# Patient Record
Sex: Male | Born: 1935 | Race: Black or African American | Hispanic: No | Smoking: Former smoker
Health system: Southern US, Community
[De-identification: ages and names within clinical notes are randomized; demographics above are authoritative.]

## PROBLEM LIST (undated history)

## (undated) DIAGNOSIS — N189 Chronic kidney disease, unspecified: Secondary | ICD-10-CM

## (undated) DIAGNOSIS — N184 Chronic kidney disease, stage 4 (severe): Secondary | ICD-10-CM

## (undated) DIAGNOSIS — I255 Ischemic cardiomyopathy: Secondary | ICD-10-CM

## (undated) DIAGNOSIS — I10 Essential (primary) hypertension: Secondary | ICD-10-CM

## (undated) DIAGNOSIS — I509 Heart failure, unspecified: Secondary | ICD-10-CM

## (undated) DIAGNOSIS — I5020 Unspecified systolic (congestive) heart failure: Secondary | ICD-10-CM

## (undated) DIAGNOSIS — I2510 Atherosclerotic heart disease of native coronary artery without angina pectoris: Secondary | ICD-10-CM

## (undated) DIAGNOSIS — I739 Peripheral vascular disease, unspecified: Secondary | ICD-10-CM

## (undated) HISTORY — PX: CORONARY STENT INTERVENTION: CATH118234

## (undated) HISTORY — DX: Unspecified systolic (congestive) heart failure: I50.20

## (undated) HISTORY — DX: Heart failure, unspecified: I50.9

## (undated) HISTORY — DX: Chronic kidney disease, stage 4 (severe): N18.4

## (undated) HISTORY — DX: Ischemic cardiomyopathy: I25.5

## (undated) HISTORY — PX: BYPASS GRAFT: SHX909

## (undated) HISTORY — DX: Essential (primary) hypertension: I10

## (undated) HISTORY — PX: REPLACEMENT TOTAL KNEE BILATERAL: SUR1225

---
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
## 2018-10-27 ENCOUNTER — Inpatient Hospital Stay
Admission: EM | Admit: 2018-10-27 | Discharge: 2018-11-02 | DRG: 291 | Disposition: A | Discharge status: Home / Self Care | Payer: Medicare Other | Attending: Internal Medicine | Admitting: Internal Medicine

## 2018-10-27 ENCOUNTER — Inpatient Hospital Stay: Payer: Medicare Other

## 2018-10-27 ENCOUNTER — Encounter: Payer: Self-pay | Admitting: Emergency Medicine

## 2018-10-27 ENCOUNTER — Other Ambulatory Visit: Payer: Self-pay

## 2018-10-27 ENCOUNTER — Inpatient Hospital Stay: Admit: 2018-10-27 | Payer: Medicare Other

## 2018-10-27 ENCOUNTER — Emergency Department: Payer: Medicare Other

## 2018-10-27 DIAGNOSIS — I132 Hypertensive heart and chronic kidney disease with heart failure and with stage 5 chronic kidney disease, or end stage renal disease: Secondary | ICD-10-CM | POA: Diagnosis not present

## 2018-10-27 DIAGNOSIS — I5042 Chronic combined systolic (congestive) and diastolic (congestive) heart failure: Secondary | ICD-10-CM

## 2018-10-27 DIAGNOSIS — Z20828 Contact with and (suspected) exposure to other viral communicable diseases: Secondary | ICD-10-CM | POA: Diagnosis present

## 2018-10-27 DIAGNOSIS — K219 Gastro-esophageal reflux disease without esophagitis: Secondary | ICD-10-CM | POA: Diagnosis present

## 2018-10-27 DIAGNOSIS — N179 Acute kidney failure, unspecified: Secondary | ICD-10-CM

## 2018-10-27 DIAGNOSIS — N289 Disorder of kidney and ureter, unspecified: Secondary | ICD-10-CM | POA: Diagnosis not present

## 2018-10-27 DIAGNOSIS — Z515 Encounter for palliative care: Secondary | ICD-10-CM | POA: Diagnosis not present

## 2018-10-27 DIAGNOSIS — I5043 Acute on chronic combined systolic (congestive) and diastolic (congestive) heart failure: Secondary | ICD-10-CM | POA: Diagnosis present

## 2018-10-27 DIAGNOSIS — I509 Heart failure, unspecified: Secondary | ICD-10-CM | POA: Diagnosis not present

## 2018-10-27 DIAGNOSIS — N184 Chronic kidney disease, stage 4 (severe): Secondary | ICD-10-CM

## 2018-10-27 DIAGNOSIS — I739 Peripheral vascular disease, unspecified: Secondary | ICD-10-CM | POA: Diagnosis present

## 2018-10-27 DIAGNOSIS — I5021 Acute systolic (congestive) heart failure: Secondary | ICD-10-CM | POA: Diagnosis not present

## 2018-10-27 DIAGNOSIS — N185 Chronic kidney disease, stage 5: Secondary | ICD-10-CM | POA: Diagnosis present

## 2018-10-27 DIAGNOSIS — I340 Nonrheumatic mitral (valve) insufficiency: Secondary | ICD-10-CM | POA: Diagnosis not present

## 2018-10-27 DIAGNOSIS — I429 Cardiomyopathy, unspecified: Secondary | ICD-10-CM | POA: Diagnosis present

## 2018-10-27 DIAGNOSIS — D696 Thrombocytopenia, unspecified: Secondary | ICD-10-CM | POA: Diagnosis present

## 2018-10-27 DIAGNOSIS — E039 Hypothyroidism, unspecified: Secondary | ICD-10-CM | POA: Diagnosis present

## 2018-10-27 DIAGNOSIS — J9621 Acute and chronic respiratory failure with hypoxia: Secondary | ICD-10-CM | POA: Diagnosis present

## 2018-10-27 DIAGNOSIS — I248 Other forms of acute ischemic heart disease: Secondary | ICD-10-CM | POA: Diagnosis not present

## 2018-10-27 DIAGNOSIS — I2510 Atherosclerotic heart disease of native coronary artery without angina pectoris: Secondary | ICD-10-CM

## 2018-10-27 DIAGNOSIS — Z79899 Other long term (current) drug therapy: Secondary | ICD-10-CM

## 2018-10-27 DIAGNOSIS — I361 Nonrheumatic tricuspid (valve) insufficiency: Secondary | ICD-10-CM | POA: Diagnosis not present

## 2018-10-27 DIAGNOSIS — Z955 Presence of coronary angioplasty implant and graft: Secondary | ICD-10-CM | POA: Diagnosis not present

## 2018-10-27 DIAGNOSIS — R600 Localized edema: Secondary | ICD-10-CM

## 2018-10-27 DIAGNOSIS — N189 Chronic kidney disease, unspecified: Secondary | ICD-10-CM | POA: Diagnosis not present

## 2018-10-27 DIAGNOSIS — J9611 Chronic respiratory failure with hypoxia: Secondary | ICD-10-CM

## 2018-10-27 DIAGNOSIS — Z7189 Other specified counseling: Secondary | ICD-10-CM | POA: Diagnosis not present

## 2018-10-27 DIAGNOSIS — R079 Chest pain, unspecified: Secondary | ICD-10-CM

## 2018-10-27 DIAGNOSIS — Z66 Do not resuscitate: Secondary | ICD-10-CM | POA: Diagnosis not present

## 2018-10-27 HISTORY — DX: Atherosclerotic heart disease of native coronary artery without angina pectoris: I25.10

## 2018-10-27 HISTORY — DX: Peripheral vascular disease, unspecified: I73.9

## 2018-10-27 LAB — LIPID PANEL
Cholesterol: 240 mg/dL — ABNORMAL HIGH (ref 0–200)
HDL: 45 mg/dL (ref >40)
LDL Cholesterol: 172 mg/dL — ABNORMAL HIGH (ref 0–99)
Total CHOL/HDL Ratio: 5.3 RATIO
Triglycerides: 117 mg/dL (ref <150)
VLDL: 23 mg/dL (ref 0–40)

## 2018-10-27 LAB — COMPREHENSIVE METABOLIC PANEL
ALT: 23 U/L (ref 0–44)
AST: 34 U/L (ref 15–41)
Albumin: 3.7 g/dL (ref 3.5–5.0)
Alkaline Phosphatase: 92 U/L (ref 38–126)
Anion gap: 12 (ref 5–15)
BUN: 58 mg/dL — ABNORMAL HIGH (ref 8–23)
CO2: 16 mmol/L — ABNORMAL LOW (ref 22–32)
Calcium: 10 mg/dL (ref 8.9–10.3)
Chloride: 115 mmol/L — ABNORMAL HIGH (ref 98–111)
Creatinine, Ser: 3.55 mg/dL — ABNORMAL HIGH (ref 0.61–1.24)
GFR calc Af Amer: 17 mL/min — ABNORMAL LOW (ref >60)
GFR calc non Af Amer: 14 mL/min — ABNORMAL LOW (ref >60)
Glucose, Bld: 107 mg/dL — ABNORMAL HIGH (ref 70–99)
Potassium: 4.2 mmol/L (ref 3.5–5.1)
Sodium: 143 mmol/L (ref 135–145)
Total Bilirubin: 1.7 mg/dL — ABNORMAL HIGH (ref 0.3–1.2)
Total Protein: 6.7 g/dL (ref 6.5–8.1)

## 2018-10-27 LAB — BRAIN NATRIURETIC PEPTIDE
B Natriuretic Peptide: >4500 pg/mL — ABNORMAL HIGH (ref 0.0–100.0)
B Natriuretic Peptide: >4500 pg/mL — ABNORMAL HIGH (ref 0.0–100.0)

## 2018-10-27 LAB — CBC
HCT: 40.5 % (ref 39.0–52.0)
Hemoglobin: 13.4 g/dL (ref 13.0–17.0)
MCH: 29.5 pg (ref 26.0–34.0)
MCHC: 33.1 g/dL (ref 30.0–36.0)
MCV: 89.2 fL (ref 80.0–100.0)
Platelets: 134 10*3/uL — ABNORMAL LOW (ref 150–400)
RBC: 4.54 MIL/uL (ref 4.22–5.81)
RDW: 20.5 % — ABNORMAL HIGH (ref 11.5–15.5)
WBC: 4.4 10*3/uL (ref 4.0–10.5)
nRBC: 0 % (ref 0.0–0.2)

## 2018-10-27 LAB — CBC WITH DIFFERENTIAL/PLATELET
Abs Immature Granulocytes: 0.02 10*3/uL (ref 0.00–0.07)
Basophils Absolute: 0 10*3/uL (ref 0.0–0.1)
Basophils Relative: 0 %
Eosinophils Absolute: 0 10*3/uL (ref 0.0–0.5)
Eosinophils Relative: 0 %
HCT: 42.9 % (ref 39.0–52.0)
Hemoglobin: 13.8 g/dL (ref 13.0–17.0)
Immature Granulocytes: 0 %
Lymphocytes Relative: 8 %
Lymphs Abs: 0.5 10*3/uL — ABNORMAL LOW (ref 0.7–4.0)
MCH: 28.8 pg (ref 26.0–34.0)
MCHC: 32.2 g/dL (ref 30.0–36.0)
MCV: 89.4 fL (ref 80.0–100.0)
Monocytes Absolute: 0.6 10*3/uL (ref 0.1–1.0)
Monocytes Relative: 10 %
Neutro Abs: 4.9 10*3/uL (ref 1.7–7.7)
Neutrophils Relative %: 82 %
Platelets: 140 10*3/uL — ABNORMAL LOW (ref 150–400)
RBC: 4.8 MIL/uL (ref 4.22–5.81)
RDW: 20.5 % — ABNORMAL HIGH (ref 11.5–15.5)
WBC: 6 10*3/uL (ref 4.0–10.5)
nRBC: 0 % (ref 0.0–0.2)

## 2018-10-27 LAB — SARS CORONAVIRUS 2 BY RT PCR (HOSPITAL ORDER, PERFORMED IN ~~LOC~~ HOSPITAL LAB): SARS Coronavirus 2: NEGATIVE

## 2018-10-27 LAB — HEPARIN LEVEL (UNFRACTIONATED)
Heparin Unfractionated: 0.66 IU/mL (ref 0.30–0.70)
Heparin Unfractionated: 0.72 IU/mL — ABNORMAL HIGH (ref 0.30–0.70)

## 2018-10-27 LAB — TROPONIN I
Troponin I: 0.3 ng/mL (ref <0.03)
Troponin I: 0.33 ng/mL (ref <0.03)
Troponin I: 0.37 ng/mL (ref <0.03)
Troponin I: 0.33 ng/mL (ref <0.03)

## 2018-10-27 LAB — PROTIME-INR
INR: 1.8 — ABNORMAL HIGH (ref 0.8–1.2)
Prothrombin Time: 20.4 seconds — ABNORMAL HIGH (ref 11.4–15.2)

## 2018-10-27 LAB — CREATININE, SERUM
Creatinine, Ser: 3.45 mg/dL — ABNORMAL HIGH (ref 0.61–1.24)
GFR calc Af Amer: 17 mL/min — ABNORMAL LOW (ref >60)
GFR calc non Af Amer: 15 mL/min — ABNORMAL LOW (ref >60)

## 2018-10-27 LAB — APTT: aPTT: >160 seconds (ref 24–36)

## 2018-10-27 MED ORDER — ASPIRIN EC 81 MG PO TBEC
81.0000 mg | DELAYED_RELEASE_TABLET | Freq: Every day | ORAL | Status: DC
  Administered 2018-10-27 – 2018-11-02 (×7): 81 mg via ORAL
  Filled 2018-10-27 (×7): qty 1

## 2018-10-27 MED ORDER — SODIUM CHLORIDE 0.9% FLUSH
3.0000 mL | Freq: Two times a day (BID) | INTRAVENOUS | Status: DC
  Administered 2018-10-28 – 2018-11-02 (×11): 3 mL via INTRAVENOUS

## 2018-10-27 MED ORDER — ISOSORBIDE MONONITRATE 20 MG PO TABS
10.0000 mg | ORAL_TABLET | Freq: Every day | ORAL | Status: DC
  Administered 2018-10-27 – 2018-10-28 (×2): 10 mg via ORAL
  Filled 2018-10-27 (×2): qty 1

## 2018-10-27 MED ORDER — SODIUM CHLORIDE 0.9 % IV SOLN: 250.0000 mL | INTRAVENOUS | Status: DC | PRN

## 2018-10-27 MED ORDER — ADULT MULTIVITAMIN W/MINERALS CH
1.0000 | ORAL_TABLET | Freq: Every day | ORAL | Status: DC
  Administered 2018-10-27 – 2018-11-02 (×7): 1 via ORAL
  Filled 2018-10-27 (×7): qty 1

## 2018-10-27 MED ORDER — ENOXAPARIN SODIUM 40 MG/0.4ML ~~LOC~~ SOLN: 30.0000 mg | SUBCUTANEOUS | Status: DC

## 2018-10-27 MED ORDER — ACETAMINOPHEN 325 MG PO TABS: 650.0000 mg | ORAL_TABLET | ORAL | Status: DC | PRN

## 2018-10-27 MED ORDER — CENTRUM PO CHEW: 1.0000 | CHEWABLE_TABLET | Freq: Every day | ORAL | Status: DC

## 2018-10-27 MED ORDER — AMLODIPINE BESYLATE 5 MG PO TABS
5.0000 mg | ORAL_TABLET | Freq: Every day | ORAL | Status: DC
  Administered 2018-10-27 – 2018-10-28 (×2): 5 mg via ORAL
  Filled 2018-10-27 (×2): qty 1

## 2018-10-27 MED ORDER — HEPARIN (PORCINE) 25000 UT/250ML-% IV SOLN
850.0000 [IU]/h | INTRAVENOUS | Status: DC
  Administered 2018-10-27: 1000 [IU]/h via INTRAVENOUS
  Administered 2018-10-28 (×2): 950 [IU]/h via INTRAVENOUS
  Filled 2018-10-27 (×2): qty 250

## 2018-10-27 MED ORDER — FOLIC ACID 1 MG PO TABS
1.0000 mg | ORAL_TABLET | Freq: Every day | ORAL | Status: DC
  Administered 2018-10-27 – 2018-11-02 (×7): 1 mg via ORAL
  Filled 2018-10-27 (×7): qty 1

## 2018-10-27 MED ORDER — CARVEDILOL 6.25 MG PO TABS
6.2500 mg | ORAL_TABLET | Freq: Every day | ORAL | Status: DC
  Administered 2018-10-27 – 2018-10-28 (×2): 6.25 mg via ORAL
  Filled 2018-10-27 (×2): qty 1

## 2018-10-27 MED ORDER — HEPARIN BOLUS VIA INFUSION
4000.0000 [IU] | Freq: Once | INTRAVENOUS | Status: AC
  Administered 2018-10-27: 4000 [IU] via INTRAVENOUS
  Filled 2018-10-27: qty 4000

## 2018-10-27 MED ORDER — ATORVASTATIN CALCIUM 20 MG PO TABS
40.0000 mg | ORAL_TABLET | Freq: Every day | ORAL | Status: DC
  Administered 2018-10-27 – 2018-11-01 (×6): 40 mg via ORAL
  Filled 2018-10-27 (×6): qty 2

## 2018-10-27 MED ORDER — PANTOPRAZOLE SODIUM 40 MG PO TBEC
40.0000 mg | DELAYED_RELEASE_TABLET | Freq: Every day | ORAL | Status: DC
  Administered 2018-10-27 – 2018-11-02 (×7): 40 mg via ORAL
  Filled 2018-10-27 (×7): qty 1

## 2018-10-27 MED ORDER — INDAPAMIDE 2.5 MG PO TABS
2.5000 mg | ORAL_TABLET | Freq: Every day | ORAL | Status: DC
  Administered 2018-10-27 – 2018-10-31 (×5): 2.5 mg via ORAL
  Filled 2018-10-27 (×5): qty 1

## 2018-10-27 MED ORDER — ONDANSETRON HCL 4 MG/2ML IJ SOLN: 4.0000 mg | Freq: Four times a day (QID) | INTRAMUSCULAR | Status: DC | PRN

## 2018-10-27 MED ORDER — SODIUM CHLORIDE 0.9% FLUSH
3.0000 mL | INTRAVENOUS | Status: DC | PRN
  Administered 2018-10-29 – 2018-10-30 (×2): 3 mL via INTRAVENOUS
  Filled 2018-10-27 (×2): qty 3

## 2018-10-27 MED ORDER — FUROSEMIDE 10 MG/ML IJ SOLN
40.0000 mg | Freq: Two times a day (BID) | INTRAMUSCULAR | Status: DC
  Administered 2018-10-27 – 2018-10-28 (×2): 40 mg via INTRAVENOUS
  Filled 2018-10-27 (×3): qty 4

## 2018-10-27 MED ORDER — FERROUS SULFATE 325 (65 FE) MG PO TABS
325.0000 mg | ORAL_TABLET | Freq: Every day | ORAL | Status: DC
  Administered 2018-10-27 – 2018-11-02 (×7): 325 mg via ORAL
  Filled 2018-10-27 (×7): qty 1

## 2018-10-27 NOTE — Progress Notes (Signed)
Family Meeting Note  Advance Directive:no  Today a meeting took place with the Patient.  Patient is able to participate.  The following clinical team members were present during this meeting:MD  The following were discussed:Patient's diagnosis: CHF exacerbation, Patient's progosis: Unable to determine and Goals for treatment: Full Code  Additional follow-up to be provided: prn  Time spent during discussion:20 minutes  Evette Doffing, MD

## 2018-10-27 NOTE — Progress Notes (Signed)
Attempted to update family member via home phone r/t mandatory daily update. No answer. Wenda Low [facility]

## 2018-10-27 NOTE — ED Notes (Signed)
MD Corky Downs called daughter and made her aware of admission status. Pharmacy tech calling at this time in order to do medication reconciliation.

## 2018-10-27 NOTE — Consult Note (Signed)
[place] for Heparin Indication: chest pain/ACS  No Known Allergies  Patient Measurements: Height: 6\' 2"  (188 cm) Weight: 170 lb (77.1 kg) IBW/kg (Calculated) : 82.2 Heparin Dosing Weight: 77.1 kg  Vital Signs: BP: 134/67 (05/25 0524) Pulse Rate: 62 (05/25 0524)  Labs: Recent Labs    10/27/18 0352  HGB 13.8  HCT 42.9  PLT 140*  CREATININE 3.55*  TROPONINI 0.30*    Estimated Creatinine Clearance: 15.1 mL/min (A) (by C-G formula based on SCr of 3.55 mg/dL (H)).   Medical History: Past Medical History:  Diagnosis Date  . Renal disorder     Medications:  (Not in a hospital admission)  Scheduled:  . amLODipine  5 mg Oral Daily  . aspirin EC  81 mg Oral Daily  . atorvastatin  40 mg Oral q1800  . carvedilol  6.25 mg Oral Daily  . enoxaparin (LOVENOX) injection  30 mg Subcutaneous Q24H  . ferrous sulfate  220 mg Oral Daily  . furosemide  40 mg Intravenous BID  . indapamide  2.5 mg Oral Daily  . isosorbide mononitrate  10 mg Oral Daily  . multivitamin-iron-minerals-folic acid  1 tablet Oral Daily  . pantoprazole  40 mg Oral Daily  . sodium chloride flush  3 mL Intravenous Q12H   Infusions:  . sodium chloride     PRN: sodium chloride, acetaminophen, ondansetron (ZOFRAN) IV, sodium chloride flush  Assessment: Pharmacy consulted to start heparin for ACS. No note of DOAC PTA.   Goal of Therapy:  Heparin level 0.3-0.7 units/ml Monitor platelets by anticoagulation protocol: Yes   Plan:  Give 4000 units bolus x 1 Start heparin infusion at 1000 units/hr Check anti-Xa level in 8 hours and daily while on heparin Continue to monitor H&H and platelets  Oswald Hillock, PharmD, BCPS 10/27/2018,5:45 AM

## 2018-10-27 NOTE — ED Notes (Signed)
ED TO INPATIENT HANDOFF REPORT  ED Nurse Name and Phone #: Wells Guiles [phone]  S Name/Age/Gender Alex Larson 85 y.o. male Room/Bed: ED02A/ED02A  Code Status   Code Status: Full Code  Home/SNF/Other Home Patient oriented to: self, place, time and situation Is this baseline? Yes   Triage Complete: Triage complete  Chief Complaint Chest Pain  Triage Note Patient reports non radiating mid sternal chest pain off and on for several days.  Patient reports constant short of breath that he wears oxygen for and states it is no worse with pain.   Allergies No Known Allergies  Level of Care/Admitting Diagnosis ED Disposition    ED Disposition Condition [facility] Area: [place] [[id number]]  Level of Care: Med-Surg [16]  Covid Evaluation: N/A  Diagnosis: Acute CHF (congestive heart failure) [facility]) [[id number]]  Admitting Physician: Hyman Bible DODD [[id number]]  Attending Physician: Hyman Bible DODD [[id number]]  Estimated length of stay: 3 - 4 days  Certification:: I certify this patient will need inpatient services for at least 2 midnights  PT Class (Do Not Modify): Inpatient [101]  PT Acc Code (Do Not Modify): Private [1]       B Medical/Surgery History Past Medical History:  Diagnosis Date  . Renal disorder    History reviewed. No pertinent surgical history.   A IV Location/Drains/Wounds Patient Lines/Drains/Airways Status   Active Line/Drains/Airways    Name:   Placement date:   Placement time:   Site:   Days:   Peripheral IV 10/27/18 Left Antecubital   10/27/18    0359    Antecubital   less than 1          Intake/Output Last 24 hours No intake or output data in the 24 hours ending 10/27/18 0536  Labs/Imaging Results for orders placed or performed during the hospital encounter of 10/27/18 (from the past 48 hour(s))  CBC with Differential     Status: Abnormal   Collection Time: 10/27/18  3:52 AM  Result Value Ref Range   WBC 6.0  4.0 - 10.5 K/uL   RBC 4.80 4.22 - 5.81 MIL/uL   Hemoglobin 13.8 13.0 - 17.0 g/dL   HCT 42.9 39.0 - 52.0 %   MCV 89.4 80.0 - 100.0 fL   MCH 28.8 26.0 - 34.0 pg   MCHC 32.2 30.0 - 36.0 g/dL   RDW 20.5 (H) 11.5 - 15.5 %   Platelets 140 (L) 150 - 400 K/uL   nRBC 0.0 0.0 - 0.2 %   Neutrophils Relative % 82 %   Neutro Abs 4.9 1.7 - 7.7 K/uL   Lymphocytes Relative 8 %   Lymphs Abs 0.5 (L) 0.7 - 4.0 K/uL   Monocytes Relative 10 %   Monocytes Absolute 0.6 0.1 - 1.0 K/uL   Eosinophils Relative 0 %   Eosinophils Absolute 0.0 0.0 - 0.5 K/uL   Basophils Relative 0 %   Basophils Absolute 0.0 0.0 - 0.1 K/uL   Immature Granulocytes 0 %   Abs Immature Granulocytes 0.02 0.00 - 0.07 K/uL    Comment: Performed at [facility], [address] [place], [place] [address]  Comprehensive metabolic panel     Status: Abnormal   Collection Time: 10/27/18  3:52 AM  Result Value Ref Range   Sodium 143 135 - 145 mmol/L   Potassium 4.2 3.5 - 5.1 mmol/L   Chloride 115 (H) 98 - 111 mmol/L   CO2 16 (L) 22 - 32 mmol/L  Glucose, Bld 107 (H) 70 - 99 mg/dL   BUN 58 (H) 8 - 23 mg/dL   Creatinine, Ser 3.55 (H) 0.61 - 1.24 mg/dL   Calcium 10.0 8.9 - 10.3 mg/dL   Total Protein 6.7 6.5 - 8.1 g/dL   Albumin 3.7 3.5 - 5.0 g/dL   AST 34 15 - 41 U/L   ALT 23 0 - 44 U/L   Alkaline Phosphatase 92 38 - 126 U/L   Total Bilirubin 1.7 (H) 0.3 - 1.2 mg/dL   GFR calc non Af Amer 14 (L) >60 mL/min   GFR calc Af Amer 17 (L) >60 mL/min   Anion gap 12 5 - 15    Comment: Performed at [facility], [address] [place], [place] [address]  Troponin I - ONCE - STAT     Status: Abnormal   Collection Time: 10/27/18  3:52 AM  Result Value Ref Range   Troponin I 0.30 (HH) <0.03 ng/mL    Comment: CRITICAL RESULT CALLED TO, READ BACK BY AND VERIFIED WITH LEE FERGUSON AT [place] 10/27/2018 DAS Performed at [facility], [address] [place], [place] [address]   Brain natriuretic peptide     Status:  Abnormal   Collection Time: 10/27/18  3:52 AM  Result Value Ref Range   B Natriuretic Peptide >4,500.0 (H) 0.0 - 100.0 pg/mL    Comment: RESULT CONFIRMED BY MANUAL DILUTION DAS Performed at [facility], [address] [place], [place] [address]   SARS Coronavirus 2 (CEPHEID - Performed in [place] hospital lab), Hosp Order     Status: None   Collection Time: 10/27/18  3:52 AM  Result Value Ref Range   SARS Coronavirus 2 NEGATIVE NEGATIVE    Comment: (NOTE) If result is NEGATIVE SARS-CoV-2 target nucleic acids are NOT DETECTED. The SARS-CoV-2 RNA is generally detectable in upper and lower  respiratory specimens during the acute phase of infection. The lowest  concentration of SARS-CoV-2 viral copies this assay can detect is 250  copies / mL. A negative result does not preclude SARS-CoV-2 infection  and should not be used as the sole basis for treatment or other  patient management decisions.  A negative result may occur with  improper specimen collection / handling, submission of specimen other  than nasopharyngeal swab, presence of viral mutation(s) within the  areas targeted by this assay, and inadequate number of viral copies  (<250 copies / mL). A negative result must be combined with clinical  observations, patient history, and epidemiological information. If result is POSITIVE SARS-CoV-2 target nucleic acids are DETECTED. The SARS-CoV-2 RNA is generally detectable in upper and lower  respiratory specimens dur ing the acute phase of infection.  Positive  results are indicative of active infection with SARS-CoV-2.  Clinical  correlation with patient history and other diagnostic information is  necessary to determine patient infection status.  Positive results do  not rule out bacterial infection or co-infection with other viruses. If result is PRESUMPTIVE POSTIVE SARS-CoV-2 nucleic acids MAY BE PRESENT.   A presumptive positive result was obtained on the  submitted specimen  and confirmed on repeat testing.  While 2019 novel coronavirus  (SARS-CoV-2) nucleic acids may be present in the submitted sample  additional confirmatory testing may be necessary for epidemiological  and / or clinical management purposes  to differentiate between  SARS-CoV-2 and other Sarbecovirus currently known to infect humans.  If clinically indicated additional testing with an alternate test  methodology [phone]) is  advised. The SARS-CoV-2 RNA is generally  detectable in upper and lower respiratory sp ecimens during the acute  phase of infection. The expected result is Negative. Fact Sheet for Patients:  StrictlyIdeas.no Fact Sheet for Healthcare Providers: BankingDealers.co.za This test is not yet approved or cleared by the [place] FDA and has been authorized for detection and/or diagnosis of SARS-CoV-2 by FDA under an Emergency Use Authorization (EUA).  This EUA will remain in effect (meaning this test can be used) for the duration of the COVID-19 declaration under Section 564(b)(1) of the Act, 21 U.S.C. section 360bbb-3(b)(1), unless the authorization is terminated or revoked sooner. Performed at [facility], [address] [place], [place] [address]    Dg Chest [place] 1 View  Result Date: 10/27/2018 CLINICAL DATA:  Shortness of breath EXAM: PORTABLE CHEST 1 VIEW COMPARISON:  None. FINDINGS: The cardiac silhouette is enlarged. There are diffuse prominent interstitial lung markings bilaterally. No pneumothorax. No large pleural effusion. There is mild elevation of the right hemidiaphragm. Aortic calcifications are noted. There is no acute osseous abnormality. Degenerative changes are noted of both shoulders. IMPRESSION: Cardiomegaly with prominent interstitial lung markings. Findings can be seen in patients with pulmonary edema versus an atypical infectious process. Electronically Signed   By:  Constance Holster M.D.   On: 10/27/2018 03:55    Pending Labs Unresulted Labs (From admission, onward)    Start     Ordered   11/03/18 0500  Creatinine, serum  (enoxaparin (LOVENOX)    CrCl < 30 ml/min)  Weekly,   STAT    Comments:  while on enoxaparin therapy.    10/27/18 0530   10/28/18 6295  Basic metabolic panel  Daily,   STAT     10/27/18 0530   10/27/18 0536  Lipid panel  Once,   STAT     10/27/18 0535   10/27/18 0536  Hemoglobin A1c  Once,   STAT     10/27/18 0536   10/27/18 0534  Brain natriuretic peptide  Once,   STAT     10/27/18 0533   10/27/18 0533  Troponin I - Now Then Q6H  Now then every 6 hours,   STAT     10/27/18 0532   10/27/18 0531  CBC  (enoxaparin (LOVENOX)    CrCl < 30 ml/min)  Once,   STAT    Comments:  Baseline for enoxaparin therapy IF NOT ALREADY DRAWN.  Notify MD if PLT < 100 K.    10/27/18 0530   10/27/18 0531  Creatinine, serum  (enoxaparin (LOVENOX)    CrCl < 30 ml/min)  Once,   STAT    Comments:  Baseline for enoxaparin therapy IF NOT ALREADY DRAWN.    10/27/18 0530          Vitals/Pain Today's Vitals   10/27/18 0326 10/27/18 0349 10/27/18 0400 10/27/18 0524  BP:  (!) 151/94 (!) 175/85 134/67  Pulse:  68 61 62  Resp:  (!) 22  (!) 22  SpO2:  97% 96% 100%  Weight: 77.1 kg     Height: 6\' 2"  (1.88 m)       Isolation Precautions No active isolations  Medications Medications  sodium chloride flush (NS) 0.9 % injection 3 mL (has no administration in time range)  sodium chloride flush (NS) 0.9 % injection 3 mL (has no administration in time range)  0.9 %  sodium chloride infusion (has no administration in time range)  acetaminophen (TYLENOL) tablet 650 mg (has no administration  in time range)  ondansetron (ZOFRAN) injection 4 mg (has no administration in time range)  enoxaparin (LOVENOX) injection 30 mg (has no administration in time range)  amLODipine (NORVASC) tablet 5 mg (has no administration in time range)  carvedilol (COREG)  tablet 6.25 mg (has no administration in time range)  ferrous sulfate 220 (44 Fe) MG/5ML solution 220 mg (has no administration in time range)  indapamide (LOZOL) tablet 2.5 mg (has no administration in time range)  isosorbide mononitrate (ISMO) tablet 10 mg (has no administration in time range)  multivitamin-iron-minerals-folic acid (CENTRUM) chewable tablet 1 tablet (has no administration in time range)  pantoprazole (PROTONIX) EC tablet 40 mg (has no administration in time range)  furosemide (LASIX) injection 40 mg (has no administration in time range)  aspirin EC tablet 81 mg (has no administration in time range)  atorvastatin (LIPITOR) tablet 40 mg (has no administration in time range)    Mobility walks with device Moderate fall risk   Focused Assessments Cardiac Assessment Handoff:  Cardiac Rhythm: Normal sinus rhythm Lab Results  Component Value Date   TROPONINI 0.30 (HH) 10/27/2018   No results found for: DDIMER Does the Patient currently have chest pain? No     R Recommendations: See Admitting Provider Note  Report given to:   Additional Notes:

## 2018-10-27 NOTE — ED Notes (Signed)
Transport to floor room 239.AS

## 2018-10-27 NOTE — ED Triage Notes (Signed)
Patient reports non radiating mid sternal chest pain off and on for several days.  Patient reports constant short of breath that he wears oxygen for and states it is no worse with pain.

## 2018-10-27 NOTE — Consult Note (Signed)
ANTICOAGULATION CONSULT NOTE  Pharmacy Consult for Heparin Indication: chest pain/ACS  No Known Allergies  Patient Measurements: Height: 6\' 2"  (188 cm) Weight: 170 lb (77.1 kg) IBW/kg (Calculated) : 82.2 Heparin Dosing Weight: 77.1 kg  Vital Signs: Temp: 98.3 F (36.8 C) (05/25 1921) Temp Source: Oral (05/25 1921) BP: 124/84 (05/25 1921) Pulse Rate: 50 (05/25 1921)  Labs: Recent Labs    10/27/18 0352 10/27/18 0639 10/27/18 1039 10/27/18 1418 10/27/18 1732 10/27/18 2210  HGB 13.8 13.4  --   --   --   --   HCT 42.9 40.5  --   --   --   --   PLT 140* 134*  --   --   --   --   APTT  --  >160*  --   --   --   --   LABPROT  --  20.4*  --   --   --   --   INR  --  1.8*  --   --   --   --   HEPARINUNFRC  --   --   --  0.66  --  0.72*  CREATININE 3.55* 3.45*  --   --   --   --   TROPONINI 0.30* 0.33* 0.37*  --  0.33*  --     Estimated Creatinine Clearance: 15.5 mL/min (A) (by C-G formula based on SCr of 3.45 mg/dL (H)).   Medical History: Past Medical History:  Diagnosis Date  . Renal disorder     Medications:  Medications Prior to Admission  Medication Sig Dispense Refill Last Dose  . amLODipine (NORVASC) 5 MG tablet Take 5 mg by mouth daily.   10/26/2018 at Unknown time  . carvedilol (COREG) 6.25 MG tablet Take 6.25 mg by mouth daily.    10/26/2018 at Unknown time  . ferrous sulfate 220 (44 Fe) MG/5ML solution Take 220 mg by mouth daily.   10/26/2018 at Unknown time  . furosemide (LASIX) 40 MG tablet Take 40 mg by mouth 2 (two) times daily.   10/26/2018 at Unknown time  . indapamide (LOZOL) 2.5 MG tablet Take 2.5 mg by mouth daily. 30 minutes prior to furosemide   10/26/2018 at Unknown time  . isosorbide mononitrate (ISMO) 10 MG tablet Take 10 mg by mouth daily.   10/26/2018 at Unknown time  . multivitamin-iron-minerals-folic acid (CENTRUM) chewable tablet Chew 1 tablet by mouth daily.   10/26/2018 at Unknown time  . pantoprazole (PROTONIX) 40 MG tablet Take 40 mg by  mouth daily.   10/26/2018 at Unknown time   Scheduled:  . amLODipine  5 mg Oral Daily  . aspirin EC  81 mg Oral Daily  . atorvastatin  40 mg Oral q1800  . carvedilol  6.25 mg Oral Daily  . ferrous sulfate  325 mg Oral Daily  . folic acid  1 mg Oral Daily  . furosemide  40 mg Intravenous BID  . indapamide  2.5 mg Oral Daily  . isosorbide mononitrate  10 mg Oral Daily  . multivitamin with minerals  1 tablet Oral Daily  . pantoprazole  40 mg Oral Daily  . sodium chloride flush  3 mL Intravenous Q12H   Infusions:  . sodium chloride    . heparin 1,000 Units/hr (10/27/18 0160)   PRN: sodium chloride, acetaminophen, ondansetron (ZOFRAN) IV, sodium chloride flush  Assessment: Pharmacy consulted to start heparin for ACS. No note of DOAC PTA.   Pt was started given 4000 unit bolus and  started an infusion at 1000 units/hr.  5/25 1418 HL 0.66 - continued current rate.  5/25 2210 HL 0.72 - elevated will decrease rate by 1 unit/kg/hr.   Goal of Therapy:  Heparin level 0.3-0.7 units/ml Monitor platelets by anticoagulation protocol: Yes   Plan:  Heparin level is slightly supratherapeutic. Will decrease rate to 950 units/hr.  Check anti-Xa level in 8 hours and CBC daily while on heparin Continue to monitor H&H and platelets    Oswald Hillock, PharmD, BCPS 10/27/2018,10:46 PM

## 2018-10-27 NOTE — ED Notes (Signed)
MD Kinner aware of pt's elevated troponin. MD Kinner at bedside.

## 2018-10-27 NOTE — Progress Notes (Signed)
Patient off unit for testing. Will resume care upon return. Alex Larson M Alex Larson  

## 2018-10-27 NOTE — ED Provider Notes (Signed)
[facility] Emergency Department Provider Note   ____________________________________________    I have reviewed the triage vital signs and the nursing notes.   HISTORY  Chief Complaint Chest Pain     HPI Alex Larson is a 85 y.o. male who presents with complaints of chest pain.  Patient describes burning chest pain in the center of his chest intermittently over the last several days.  He reports his breathing is at baseline, he does wear 2 L nasal cannula at home.  Denies fevers or chills.  No myalgias.  No cough.  Reports that he has had significant swelling in his legs over the last week as well.  No nausea or vomiting or diaphoresis.  Has not taken anything for this  No past medical history on file.  There are no active problems to display for this patient.     Prior to Admission medications   Medication Sig Start Date End Date Taking? Authorizing Provider  amLODipine (NORVASC) 5 MG tablet Take 5 mg by mouth daily.   Yes [provider]  carvedilol (COREG) 6.25 MG tablet Take 6.25 mg by mouth 2 (two) times daily with a meal.   Yes [provider]  ferrous sulfate 220 (44 Fe) MG/5ML solution Take 220 mg by mouth daily.   Yes [provider]  ferrous sulfate 220 (44 Fe) MG/5ML solution Take 325 mg by mouth 2 (two) times daily with a meal.   Yes [provider]  furosemide (LASIX) 40 MG tablet Take 40 mg by mouth 2 (two) times daily.   Yes [provider]  indapamide (LOZOL) 2.5 MG tablet Take 2.5 mg by mouth daily. 30 minutes prior to furosemide   Yes [provider]  isosorbide mononitrate (ISMO) 10 MG tablet Take 10 mg by mouth daily.   Yes [provider]  multivitamin-iron-minerals-folic acid (CENTRUM) chewable tablet Chew 1 tablet by mouth daily.   Yes [provider]  pantoprazole (PROTONIX) 40 MG tablet Take 40 mg by mouth daily.   Yes [provider]      Allergies Patient has no known allergies.  No family history on file.  Social History No alcohol, no smoking Review of Systems  Constitutional: No fever/chills Eyes: No visual changes.  ENT: No sore throat. Cardiovascular: As above Respiratory: Denies shortness of breath. Gastrointestinal: No abdominal pain.  No nausea, no vomiting.   Genitourinary: Negative for dysuria. Musculoskeletal: Edema as above Skin: Negative for rash. Neurological: Negative for headache   ____________________________________________   PHYSICAL EXAM:  VITAL SIGNS: ED Triage Vitals  Enc Vitals Group     BP 10/27/18 0349 (!) 151/94     Pulse Rate 10/27/18 0349 68     Resp 10/27/18 0349 (!) 22     Temp --      Temp src --      SpO2 10/27/18 0349 97 %     Weight 10/27/18 0326 77.1 kg (170 lb)     Height 10/27/18 0326 1.88 m (6\' 2" )     Head Circumference --      Peak Flow --      Pain Score --      Pain Loc --      Pain Edu? --      Excl. in [place]? --     Constitutional: Alert and oriented.  Eyes: Conjunctivae are normal.   Nose: No congestion/rhinnorhea. Mouth/Throat: Mucous membranes are moist.    Cardiovascular: Normal rate, regular rhythm. Grossly  normal heart sounds.  Good peripheral circulation. Respiratory: Normal respiratory effort.  No retractions. Lungs CTAB. Gastrointestinal: Soft and nontender. No distention.  No CVA tenderness.  Musculoskeletal: 2+ edema bilaterally Neurologic:  Normal speech and language. No gross focal neurologic deficits are appreciated.  Skin:  Skin is warm, dry and intact. No rash noted. Psychiatric: Mood and affect are normal. Speech and behavior are normal.  ____________________________________________   LABS (all labs ordered are listed, but only abnormal results are displayed)  Labs Reviewed  CBC WITH DIFFERENTIAL/PLATELET - Abnormal; Notable for the following components:      Result Value   RDW 20.5 (*)    Platelets 140 (*)    Lymphs Abs  0.5 (*)    All other components within normal limits  COMPREHENSIVE METABOLIC PANEL - Abnormal; Notable for the following components:   Chloride 115 (*)    CO2 16 (*)    Glucose, Bld 107 (*)    BUN 58 (*)    Creatinine, Ser 3.55 (*)    Total Bilirubin 1.7 (*)    GFR calc non Af Amer 14 (*)    GFR calc Af Amer 17 (*)    All other components within normal limits  TROPONIN I - Abnormal; Notable for the following components:   Troponin I 0.30 (*)    All other components within normal limits  SARS CORONAVIRUS 2 (HOSPITAL ORDER, [facility])  BRAIN NATRIURETIC PEPTIDE   ____________________________________________  EKG  ED ECG REPORT I, Lavonia Drafts, the attending physician, personally viewed and interpreted this ECG.  Date: 10/27/2018  Rhythm: normal sinus rhythm QRS Axis: Abnormal Intervals: Abnormal ST/T Wave abnormalities: Nonspecific changes   ____________________________________________  RADIOLOGY  Chest x-ray consistent with pulmonary edema ____________________________________________   PROCEDURES  Procedure(s) performed: No  Procedures   Critical Care performed: yes  CRITICAL CARE Performed by: Lavonia Drafts   Total critical care time 30 minutes  Critical care time was exclusive of separately billable procedures and treating other patients.  Critical care was necessary to treat or prevent imminent or life-threatening deterioration.  Critical care was time spent personally by me on the following activities: development of treatment plan with patient and/or surrogate as well as nursing, discussions with consultants, evaluation of patient's response to treatment, examination of patient, obtaining history from patient or surrogate, ordering and performing treatments and interventions, ordering and review of laboratory studies, ordering and review of radiographic studies, pulse oximetry and re-evaluation of patient's condition.   ____________________________________________   INITIAL IMPRESSION / ASSESSMENT AND PLAN / ED COURSE  Pertinent labs & imaging results that were available during my care of the patient were reviewed by me and considered in my medical decision making (see chart for details).  Patient presents with burning chest discomfort as noted above.  Differential includes angina, GERD, less likely ACS, not consistent with PE or dissection.  Pending labs, chest x-ray, EKG, place patient on cardiac  Patient chest x-ray is concerning for pulmonary edema this is in keeping with his clinical exam.  Lab work has returned with a elevated troponin of 0.3 likely related to strain from pulmonary edema, he also has evidence of kidney disease, unclear whether this is chronic or acute.  I discussed with hospitalist service for admission    ____________________________________________   FINAL CLINICAL IMPRESSION(S) / ED DIAGNOSES  Final diagnoses:  Chest pain, unspecified type  Acute on chronic congestive heart failure, unspecified heart failure type ([place])  Note:  This document was prepared using Dragon voice recognition software and may include unintentional dictation errors.   Lavonia Drafts, MD 10/27/18 [phone]

## 2018-10-27 NOTE — H&P (Signed)
[facility] [place] NAME: Alex Larson    MR#:  [id number]  DATE OF BIRTH:  Nov 23, 1926  DATE OF ADMISSION:  10/27/2018  PRIMARY CARE PHYSICIAN: Patient, No Pcp Per   REQUESTING/REFERRING PHYSICIAN: Lavonia Drafts, MD  CHIEF COMPLAINT:   Chief Complaint  Patient presents with  . Chest Pain    HISTORY OF PRESENT ILLNESS:  Alex Larson  is a 85 y.o. male with a known history of CHF, coronary artery disease with stent placement x2, peripheral vascular disease with a history of right lower extremity bypass, and hypertension.  He tells me he recently relocated to the area living with his daughter currently.  His last residence was in [place] and he has not yet established primary care in the area.  He reports being out of his Lasix for 4 months.  he presented to the emergency room complaining of a 3-day history of intermittent midsternal chest pain with associated shortness of breath.  Chest pain described as burning with a pain score 4 out of 10 and nonradiating.  He had 2 episodes of nausea with vomiting on yesterday however he is uncertain whether or not these were associated with the chest pain.  He denies associated diaphoresis.  He denies history of MI.  He has a history of chronic respiratory failure with oxygen at 2 L/min 24 hours a day.  He denies abdominal pain.  He denies fevers, chills, diarrhea.  He denies cough.  He denies a prior history of DVT.  However, he has a history of peripheral vascular disease with right lower extremity bypass in the past.  Labs include troponin of 0.30.  BUN 58 and creatinine 3.55.  Chest x-ray demonstrates prominent interstitial markings suggestive of pulmonary edema.  COVID-19 testing is negative.      PAST MEDICAL HISTORY:   Past Medical History:  Diagnosis Date  . Renal disorder     PAST SURGICAL HISTORY:  History reviewed. No pertinent surgical history.  SOCIAL HISTORY:   Social History    Tobacco Use  . Smoking status: Unknown If Ever Smoked  Substance Use Topics  . Alcohol use: Not Currently    FAMILY HISTORY:  No family history on file.  DRUG ALLERGIES:  No Known Allergies  REVIEW OF SYSTEMS:   Review of Systems  Constitutional: Negative for chills, diaphoresis, fever and malaise/fatigue.  HENT: Negative for congestion, sore throat and tinnitus.   Eyes: Negative for blurred vision and double vision.  Respiratory: Positive for shortness of breath. Negative for cough, sputum production and wheezing.   Cardiovascular: Positive for chest pain and leg swelling. Negative for palpitations and orthopnea.  Gastrointestinal: Positive for nausea and vomiting. Negative for abdominal pain, blood in stool, constipation and diarrhea.  Genitourinary: Negative for dysuria and hematuria.  Musculoskeletal: Negative for falls and myalgias.  Skin: Negative for itching and rash.  Neurological: Negative for dizziness, focal weakness, loss of consciousness and headaches.  Psychiatric/Behavioral: Negative.       MEDICATIONS AT HOME:   Prior to Admission medications   Medication Sig Start Date End Date Taking? Authorizing Provider  amLODipine (NORVASC) 5 MG tablet Take 5 mg by mouth daily.   Yes [provider]  carvedilol (COREG) 6.25 MG tablet Take 6.25 mg by mouth daily.    Yes [provider]  ferrous sulfate 220 (44 Fe) MG/5ML solution Take 220 mg by mouth daily.   Yes [provider]  furosemide (LASIX) 40 MG tablet  Take 40 mg by mouth 2 (two) times daily.   Yes [provider]  indapamide (LOZOL) 2.5 MG tablet Take 2.5 mg by mouth daily. 30 minutes prior to furosemide   Yes [provider]  isosorbide mononitrate (ISMO) 10 MG tablet Take 10 mg by mouth daily.   Yes [provider]  multivitamin-iron-minerals-folic acid (CENTRUM) chewable tablet Chew 1 tablet by mouth daily.   Yes [provider]  pantoprazole  (PROTONIX) 40 MG tablet Take 40 mg by mouth daily.   Yes [provider]      VITAL SIGNS:  Blood pressure 134/67, pulse 62, resp. rate (!) 22, height 6\' 2"  (1.88 m), weight 77.1 kg, SpO2 100 %.  PHYSICAL EXAMINATION:  Physical Exam Vitals signs and nursing note reviewed.  Constitutional:      General: He is not in acute distress.    Appearance: He is ill-appearing.  HENT:     Head: Normocephalic.  Eyes:     Extraocular Movements: Extraocular movements intact.     Pupils: Pupils are equal, round, and reactive to light.  Neck:     Musculoskeletal: Normal range of motion and neck supple.     Vascular: No JVD.  Cardiovascular:     Rate and Rhythm: Normal rate and regular rhythm.     Pulses:          Radial pulses are 2+ on the right side and 2+ on the left side.       Dorsalis pedis pulses are 1+ on the right side and 2+ on the left side.       Posterior tibial pulses are 2+ on the right side and 2+ on the left side.     Heart sounds: Murmur present. Systolic murmur present with a grade of 5/6. No friction rub.  Pulmonary:     Effort: No respiratory distress.     Breath sounds: Normal breath sounds. No decreased breath sounds, wheezing, rhonchi or rales.  Chest:     Chest wall: No mass or tenderness.  Abdominal:     General: Bowel sounds are normal.     Palpations: Abdomen is soft. There is no mass.     Tenderness: There is no abdominal tenderness. There is no guarding or rebound.  Musculoskeletal:     Right lower leg: He exhibits no tenderness. Edema (2+ pitting) present.     Left lower leg: He exhibits no tenderness. Edema (2+ pitting) present.  Skin:    General: Skin is warm.     Capillary Refill: Capillary refill takes less than 2 seconds.     Findings: No rash.  Neurological:     Mental Status: He is alert and oriented to person, place, and time.  Psychiatric:        Mood and Affect: Mood normal.        Behavior: Behavior normal.       LABORATORY  PANEL:   CBC Recent Labs  Lab 10/27/18 0352  WBC 6.0  HGB 13.8  HCT 42.9  PLT 140*   ------------------------------------------------------------------------------------------------------------------  Chemistries  Recent Labs  Lab 10/27/18 0352  NA 143  K 4.2  CL 115*  CO2 16*  GLUCOSE 107*  BUN 58*  CREATININE 3.55*  CALCIUM 10.0  AST 34  ALT 23  ALKPHOS 92  BILITOT 1.7*   ------------------------------------------------------------------------------------------------------------------  Cardiac Enzymes Recent Labs  Lab 10/27/18 0352  TROPONINI 0.30*   ------------------------------------------------------------------------------------------------------------------  RADIOLOGY:  Dg Chest Port 1 View  Result Date: 10/27/2018  CLINICAL DATA:  Shortness of breath EXAM: PORTABLE CHEST 1 VIEW COMPARISON:  None. FINDINGS: The cardiac silhouette is enlarged. There are diffuse prominent interstitial lung markings bilaterally. No pneumothorax. No large pleural effusion. There is mild elevation of the right hemidiaphragm. Aortic calcifications are noted. There is no acute osseous abnormality. Degenerative changes are noted of both shoulders. IMPRESSION: Cardiomegaly with prominent interstitial lung markings. Findings can be seen in patients with pulmonary edema versus an atypical infectious process. Electronically Signed   By: Constance Holster M.D.   On: 10/27/2018 03:55      IMPRESSION AND PLAN:   1.  Chest pain - With troponin initially 0.30.  Given limited history, we have started him on heparin infusion - Cardiology consulted for further evaluation and recommendations as well -Echocardiogram - Telemetry monitoring - Statin and beta-blocker therapy resumed -Lipid panel pending - Continue to trend troponin levels  2.  Acute on chronic CHF - Diuretic therapy initiated with Lasix 40 mg IV twice daily - Repeat chest x-ray in the a.m. - Echocardiogram the a.m.  -Cardiology consulted - He is on telemetry monitoring - Bilateral lower extremity ultrasound with patient's history of right lower extremity bypass --Daily weights with strict I's and O's  3.  Acute kidney injury - Renal ultrasound - We will monitor renal function closely during diuresis with Lasix - We will repeat BMP in the a.m.  4. hypertension - Norvasc resume -We will treat persistent hypertension expectantly  5.  GERD - PPI therapy initiated   All the records are reviewed and case discussed with ED provider. The plan of care was discussed in details with the patient (and family). I answered all questions. The patient agreed to proceed with the above mentioned plan. Further management will depend upon hospital course.   CODE STATUS: Full code  TOTAL TIME TAKING CARE OF THIS PATIENT: 45 minutes.    [place] on 10/27/2018 at 5:40 AM  Pager - [phone]  After 6pm go to [email or web address] - Proofreader  [facility]  Office  [phone]  CC: Primary care physician; Patient, No Pcp Per   Note: This dictation was prepared with Dragon dictation along with smaller phrase technology. Any transcriptional errors that result from this process are unintentional.

## 2018-10-27 NOTE — Consult Note (Signed)
[facility] for Heparin Indication: chest pain/ACS  No Known Allergies  Patient Measurements: Height: 6\' 2"  (188 cm) Weight: 170 lb (77.1 kg) IBW/kg (Calculated) : 82.2 Heparin Dosing Weight: 77.1 kg  Vital Signs: Temp: 97.6 F (36.4 C) (05/25 0618) Temp Source: Oral (05/25 0618) BP: 143/90 (05/25 0755) Pulse Rate: 56 (05/25 0755)  Labs: Recent Labs    10/27/18 0352 10/27/18 0639 10/27/18 1039 10/27/18 1418  HGB 13.8 13.4  --   --   HCT 42.9 40.5  --   --   PLT 140* 134*  --   --   APTT  --  >160*  --   --   LABPROT  --  20.4*  --   --   INR  --  1.8*  --   --   HEPARINUNFRC  --   --   --  0.66  CREATININE 3.55* 3.45*  --   --   TROPONINI 0.30* 0.33* 0.37*  --     Estimated Creatinine Clearance: 15.5 mL/min (A) (by C-G formula based on SCr of 3.45 mg/dL (H)).   Medical History: Past Medical History:  Diagnosis Date  . Renal disorder     Medications:  Medications Prior to Admission  Medication Sig Dispense Refill Last Dose  . amLODipine (NORVASC) 5 MG tablet Take 5 mg by mouth daily.   10/26/2018 at Unknown time  . carvedilol (COREG) 6.25 MG tablet Take 6.25 mg by mouth daily.    10/26/2018 at Unknown time  . ferrous sulfate 220 (44 Fe) MG/5ML solution Take 220 mg by mouth daily.   10/26/2018 at Unknown time  . furosemide (LASIX) 40 MG tablet Take 40 mg by mouth 2 (two) times daily.   10/26/2018 at Unknown time  . indapamide (LOZOL) 2.5 MG tablet Take 2.5 mg by mouth daily. 30 minutes prior to furosemide   10/26/2018 at Unknown time  . isosorbide mononitrate (ISMO) 10 MG tablet Take 10 mg by mouth daily.   10/26/2018 at Unknown time  . multivitamin-iron-minerals-folic acid (CENTRUM) chewable tablet Chew 1 tablet by mouth daily.   10/26/2018 at Unknown time  . pantoprazole (PROTONIX) 40 MG tablet Take 40 mg by mouth daily.   10/26/2018 at Unknown time   Scheduled:  . amLODipine  5 mg Oral Daily  . aspirin EC  81 mg Oral Daily  .  atorvastatin  40 mg Oral q1800  . carvedilol  6.25 mg Oral Daily  . ferrous sulfate  325 mg Oral Daily  . folic acid  1 mg Oral Daily  . furosemide  40 mg Intravenous BID  . indapamide  2.5 mg Oral Daily  . isosorbide mononitrate  10 mg Oral Daily  . multivitamin with minerals  1 tablet Oral Daily  . pantoprazole  40 mg Oral Daily  . sodium chloride flush  3 mL Intravenous Q12H   Infusions:  . sodium chloride    . heparin 1,000 Units/hr (10/27/18 9892)   PRN: sodium chloride, acetaminophen, ondansetron (ZOFRAN) IV, sodium chloride flush  Assessment: Pharmacy consulted to start heparin for ACS. No note of DOAC PTA.   Goal of Therapy:  Heparin level 0.3-0.7 units/ml Monitor platelets by anticoagulation protocol: Yes   Plan:  Give 4000 units bolus x 1 Start heparin infusion at 1000 units/hr Check anti-Xa level in 8 hours and daily while on heparin Continue to monitor H&H and platelets   5/25 HL@ 1418= 0.66. Will continue current drip rate of 1000 units/hr. Will  check confirmatory level in 8 hours.   Candela Krul A, PharmD, BCPS 10/27/2018,3:22 PM

## 2018-10-27 NOTE — Progress Notes (Signed)
Saw the patient bedside, examined patient. Vital sign and lab reviewed. He is on oxygen 4 L.  He feels better. Continue current treatment.  Follow-up cardiology consult.  Discussed with patient and RN.  Time spent about 25 minutes.

## 2018-10-28 ENCOUNTER — Encounter: Payer: Self-pay | Admitting: Physician Assistant

## 2018-10-28 ENCOUNTER — Inpatient Hospital Stay: Payer: Medicare Other

## 2018-10-28 ENCOUNTER — Inpatient Hospital Stay (HOSPITAL_COMMUNITY)
Admit: 2018-10-28 | Discharge: 2018-10-28 | Disposition: A | Discharge status: Home / Self Care | Payer: Medicare Other | Attending: Nurse Practitioner | Admitting: Nurse Practitioner

## 2018-10-28 DIAGNOSIS — I5043 Acute on chronic combined systolic (congestive) and diastolic (congestive) heart failure: Secondary | ICD-10-CM

## 2018-10-28 DIAGNOSIS — I340 Nonrheumatic mitral (valve) insufficiency: Secondary | ICD-10-CM

## 2018-10-28 DIAGNOSIS — E039 Hypothyroidism, unspecified: Secondary | ICD-10-CM

## 2018-10-28 DIAGNOSIS — N289 Disorder of kidney and ureter, unspecified: Secondary | ICD-10-CM

## 2018-10-28 DIAGNOSIS — I248 Other forms of acute ischemic heart disease: Secondary | ICD-10-CM

## 2018-10-28 DIAGNOSIS — I361 Nonrheumatic tricuspid (valve) insufficiency: Secondary | ICD-10-CM

## 2018-10-28 LAB — URINALYSIS, ROUTINE W REFLEX MICROSCOPIC
Bacteria, UA: NONE SEEN
Bilirubin Urine: NEGATIVE
Glucose, UA: NEGATIVE mg/dL
Ketones, ur: NEGATIVE mg/dL
Leukocytes,Ua: NEGATIVE
Nitrite: NEGATIVE
Protein, ur: NEGATIVE mg/dL
Specific Gravity, Urine: 1.006 (ref 1.005–1.030)
pH: 5 (ref 5.0–8.0)

## 2018-10-28 LAB — CBC
HCT: 37.2 % — ABNORMAL LOW (ref 39.0–52.0)
Hemoglobin: 12.4 g/dL — ABNORMAL LOW (ref 13.0–17.0)
MCH: 28.9 pg (ref 26.0–34.0)
MCHC: 33.3 g/dL (ref 30.0–36.0)
MCV: 86.7 fL (ref 80.0–100.0)
Platelets: 123 10*3/uL — ABNORMAL LOW (ref 150–400)
RBC: 4.29 MIL/uL (ref 4.22–5.81)
RDW: 20.2 % — ABNORMAL HIGH (ref 11.5–15.5)
WBC: 3.5 10*3/uL — ABNORMAL LOW (ref 4.0–10.5)
nRBC: 0 % (ref 0.0–0.2)

## 2018-10-28 LAB — ECHOCARDIOGRAM COMPLETE
Height: 74 in
Weight: 2704 oz

## 2018-10-28 LAB — BASIC METABOLIC PANEL
Anion gap: 9 (ref 5–15)
BUN: 59 mg/dL — ABNORMAL HIGH (ref 8–23)
CO2: 18 mmol/L — ABNORMAL LOW (ref 22–32)
Calcium: 9.4 mg/dL (ref 8.9–10.3)
Chloride: 116 mmol/L — ABNORMAL HIGH (ref 98–111)
Creatinine, Ser: 3.35 mg/dL — ABNORMAL HIGH (ref 0.61–1.24)
GFR calc Af Amer: 18 mL/min — ABNORMAL LOW (ref >60)
GFR calc non Af Amer: 15 mL/min — ABNORMAL LOW (ref >60)
Glucose, Bld: 80 mg/dL (ref 70–99)
Potassium: 4.5 mmol/L (ref 3.5–5.1)
Sodium: 143 mmol/L (ref 135–145)

## 2018-10-28 LAB — HEPARIN LEVEL (UNFRACTIONATED): Heparin Unfractionated: 0.71 IU/mL — ABNORMAL HIGH (ref 0.30–0.70)

## 2018-10-28 LAB — HEMOGLOBIN A1C
Hgb A1c MFr Bld: 5.7 % — ABNORMAL HIGH (ref 4.8–5.6)
Mean Plasma Glucose: 117 mg/dL

## 2018-10-28 LAB — TSH: TSH: 117.652 u[IU]/mL — ABNORMAL HIGH (ref 0.350–4.500)

## 2018-10-28 LAB — MAGNESIUM: Magnesium: 1.8 mg/dL (ref 1.7–2.4)

## 2018-10-28 MED ORDER — LEVOTHYROXINE SODIUM 25 MCG PO TABS
25.0000 ug | ORAL_TABLET | Freq: Every day | ORAL | Status: DC
  Administered 2018-10-29 – 2018-11-02 (×5): 25 ug via ORAL
  Filled 2018-10-28 (×5): qty 1

## 2018-10-28 MED ORDER — CARVEDILOL 3.125 MG PO TABS
3.1250 mg | ORAL_TABLET | Freq: Every day | ORAL | Status: DC
  Administered 2018-10-29 – 2018-10-31 (×3): 3.125 mg via ORAL
  Filled 2018-10-28 (×3): qty 1

## 2018-10-28 MED ORDER — ISOSORB DINITRATE-HYDRALAZINE 20-37.5 MG PO TABS
1.0000 | ORAL_TABLET | Freq: Three times a day (TID) | ORAL | Status: DC
  Administered 2018-10-28 – 2018-11-02 (×15): 1 via ORAL
  Filled 2018-10-28 (×17): qty 1

## 2018-10-28 MED ORDER — ENOXAPARIN SODIUM 30 MG/0.3ML ~~LOC~~ SOLN
30.0000 mg | SUBCUTANEOUS | Status: DC
  Administered 2018-10-29 – 2018-11-02 (×5): 30 mg via SUBCUTANEOUS
  Filled 2018-10-28 (×5): qty 0.3

## 2018-10-28 MED ORDER — ENOXAPARIN SODIUM 40 MG/0.4ML ~~LOC~~ SOLN: 40.0000 mg | SUBCUTANEOUS | Status: DC

## 2018-10-28 MED ORDER — FUROSEMIDE 10 MG/ML IJ SOLN
80.0000 mg | Freq: Two times a day (BID) | INTRAMUSCULAR | Status: DC
  Administered 2018-10-28 – 2018-10-29 (×3): 80 mg via INTRAVENOUS
  Filled 2018-10-28 (×3): qty 8

## 2018-10-28 NOTE — Progress Notes (Signed)
*  PRELIMINARY RESULTS* Echocardiogram 2D Echocardiogram has been performed.  Alex Larson 10/28/2018, 9:06 AM

## 2018-10-28 NOTE — Progress Notes (Signed)
   10/28/18 1600  Clinical Encounter Type  Visited With Patient not available  Visit Type Follow-up  Referral From Physician  Ch went in for AD education. Pt asleep. Ch will follow up tomorrow.

## 2018-10-28 NOTE — Progress Notes (Signed)
Patient's daughter Georgia Lopes called and updated over the phone per patient request. Daughter appreciated call and all questions were answered appropriately. Will continue to monitor patient.

## 2018-10-28 NOTE — Consult Note (Signed)
[facility]  CONSULT NOTE    Date: 10/28/2018                  Patient Name:  Lucian Baswell  MRN: [id number]  DOB: [birth year]  Age / Sex: 85 y.o., male         PCP: Patient, No Pcp Per                 Service Requesting Consult: Dr. Benjie Karvonen                 Reason for Consult: Acute renal failure            History of Present Illness: Mr. Normand Damron  Admitted to [facility] with acute exacerbation of systolic congestive heart failure. EF of 10-15%. Patient states he has been having progressive shortness of breath. Home regimen of furosemide 80mg  bid and indapamide 2.5mg  before morning dose of furosemide.    Medications: Outpatient medications: Medications Prior to Admission  Medication Sig Dispense Refill Last Dose  . amLODipine (NORVASC) 5 MG tablet Take 5 mg by mouth daily.   10/26/2018 at Unknown time  . carvedilol (COREG) 6.25 MG tablet Take 6.25 mg by mouth daily.    10/26/2018 at Unknown time  . ferrous sulfate 220 (44 Fe) MG/5ML solution Take 220 mg by mouth daily.   10/26/2018 at Unknown time  . furosemide (LASIX) 40 MG tablet Take 40 mg by mouth 2 (two) times daily.   10/26/2018 at Unknown time  . indapamide (LOZOL) 2.5 MG tablet Take 2.5 mg by mouth daily. 30 minutes prior to furosemide   10/26/2018 at Unknown time  . isosorbide mononitrate (ISMO) 10 MG tablet Take 10 mg by mouth daily.   10/26/2018 at Unknown time  . multivitamin-iron-minerals-folic acid (CENTRUM) chewable tablet Chew 1 tablet by mouth daily.   10/26/2018 at Unknown time  . pantoprazole (PROTONIX) 40 MG tablet Take 40 mg by mouth daily.   10/26/2018 at Unknown time    Current medications: Current Facility-Administered Medications  Medication Dose Route Frequency Provider Last Rate Last Dose  . 0.9 %  sodium chloride infusion  250 mL Intravenous PRN Seals, Levada Dy H, NP      . acetaminophen (TYLENOL) tablet 650 mg  650 mg Oral Q4H PRN Seals, Theo Dills, NP      . aspirin EC tablet 81 mg  81  mg Oral Daily Seals, Theo Dills, NP   81 mg at 10/28/18 0825  . atorvastatin (LIPITOR) tablet 40 mg  40 mg Oral q1800 Seals, Levada Dy H, NP   40 mg at 10/27/18 1705  . [START ON 10/29/2018] carvedilol (COREG) tablet 3.125 mg  3.125 mg Oral Daily Dunn, Ryan M, PA-C      . [START ON 10/29/2018] enoxaparin (LOVENOX) injection 30 mg  30 mg Subcutaneous Q24H Mody, Sital, MD      . ferrous sulfate tablet 325 mg  325 mg Oral Daily Seals, Angela H, NP   325 mg at 10/28/18 0825  . folic acid (FOLVITE) tablet 1 mg  1 mg Oral Daily Seals, Angela H, NP   1 mg at 10/28/18 0825  . furosemide (LASIX) injection 80 mg  80 mg Intravenous BID Dunn, Ryan M, PA-C      . indapamide (LOZOL) tablet 2.5 mg  2.5 mg Oral Daily Seals, Angela H, NP   2.5 mg at 10/28/18 0825  . isosorbide-hydrALAZINE (BIDIL) 20-37.5 MG per tablet 1 tablet  1 tablet Oral TID Christell Faith  M, PA-C      . [START ON 10/29/2018] levothyroxine (SYNTHROID) tablet 25 mcg  25 mcg Oral QAC breakfast Mody, Sital, MD      . multivitamin with minerals tablet 1 tablet  1 tablet Oral Daily Seals, Theo Dills, NP   1 tablet at 10/28/18 [phone]  . ondansetron (ZOFRAN) injection 4 mg  4 mg Intravenous Q6H PRN Seals, Levada Dy H, NP      . pantoprazole (PROTONIX) EC tablet 40 mg  40 mg Oral Daily Seals, Levada Dy H, NP   40 mg at 10/28/18 0824  . sodium chloride flush (NS) 0.9 % injection 3 mL  3 mL Intravenous Q12H Seals, Angela H, NP   3 mL at 10/28/18 0826  . sodium chloride flush (NS) 0.9 % injection 3 mL  3 mL Intravenous PRN Seals, Theo Dills, NP          Allergies: No Known Allergies    Past Medical History: Past Medical History:  Diagnosis Date  . CAD (coronary artery disease)   . CKD (chronic kidney disease)   . PAD (peripheral artery disease) ([place])      Past Surgical History: History reviewed. No pertinent surgical history.   Family History: History reviewed. No pertinent family history.   Social History: Social History   Socioeconomic History  .  Marital status: Widowed    Spouse name: Not on file  . Number of children: Not on file  . Years of education: Not on file  . Highest education level: Not on file  Occupational History  . Not on file  Social Needs  . Financial resource strain: Not on file  . Food insecurity:    Worry: Not on file    Inability: Not on file  . Transportation needs:    Medical: Not on file    Non-medical: Not on file  Tobacco Use  . Smoking status: Unknown If Ever Smoked  . Smokeless tobacco: Former Network engineer and Sexual Activity  . Alcohol use: Not Currently  . Drug use: Never  . Sexual activity: Not on file  Lifestyle  . Physical activity:    Days per week: Not on file    Minutes per session: Not on file  . Stress: Not on file  Relationships  . Social connections:    Talks on phone: Not on file    Gets together: Not on file    Attends religious service: Not on file    Active member of club or organization: Not on file    Attends meetings of clubs or organizations: Not on file    Relationship status: Not on file  . Intimate partner violence:    Fear of current or ex partner: Not on file    Emotionally abused: Not on file    Physically abused: Not on file    Forced sexual activity: Not on file  Other Topics Concern  . Not on file  Social History Narrative  . Not on file     Review of Systems: Review of Systems  Constitutional: Negative.  Negative for chills, diaphoresis, fever, malaise/fatigue and weight loss.  HENT: Negative.  Negative for congestion, ear discharge, ear pain, hearing loss, nosebleeds, sinus pain, sore throat and tinnitus.   Eyes: Negative.  Negative for blurred vision, double vision, photophobia, pain, discharge and redness.  Respiratory: Positive for shortness of breath. Negative for cough, hemoptysis, sputum production, wheezing and stridor.   Cardiovascular: Positive for leg swelling and PND. Negative for chest pain,  palpitations, orthopnea and claudication.   Gastrointestinal: Negative for abdominal pain, blood in stool, constipation, diarrhea, heartburn, melena, nausea and vomiting.  Genitourinary: Negative.  Negative for dysuria, flank pain, frequency, hematuria and urgency.  Musculoskeletal: Negative.  Negative for back pain, falls, joint pain, myalgias and neck pain.  Skin: Negative.  Negative for itching and rash.  Neurological: Negative.  Negative for dizziness, tingling, tremors, sensory change, speech change, focal weakness, seizures, loss of consciousness, weakness and headaches.  Endo/Heme/Allergies: Negative for environmental allergies and polydipsia. Does not bruise/bleed easily.  Psychiatric/Behavioral: Negative.  Negative for depression, hallucinations, memory loss, substance abuse and suicidal ideas. The patient is not nervous/anxious and does not have insomnia.     Vital Signs: Blood pressure 134/82, pulse (!) 50, temperature (!) 97.5 F (36.4 C), temperature source Oral, resp. rate 16, height 6\' 2"  (1.88 m), weight 76.7 kg, SpO2 95 %.  Weight trends: Filed Weights   10/27/18 0326 10/28/18 0510  Weight: 77.1 kg 76.7 kg    Physical Exam: General: NAD,   Head: Normocephalic, atraumatic. Moist oral mucosal membranes  Eyes: Anicteric, PERRL  Neck: Supple, trachea midline  Lungs:  Clear to auscultation  Heart: bradycardia  Abdomen:  Soft, nontender,   Extremities:  ++ peripheral edema.  Neurologic: Nonfocal, moving all four extremities  Skin: No lesions         Lab results: Basic Metabolic Panel: Recent Labs  Lab 10/27/18 0352 10/27/18 0639 10/28/18 0014  NA 143  --  143  K 4.2  --  4.5  CL 115*  --  116*  CO2 16*  --  18*  GLUCOSE 107*  --  80  BUN 58*  --  59*  CREATININE 3.55* 3.45* 3.35*  CALCIUM 10.0  --  9.4  MG  --   --  1.8    Liver Function Tests: Recent Labs  Lab 10/27/18 0352  AST 34  ALT 23  ALKPHOS 92  BILITOT 1.7*  PROT 6.7  ALBUMIN 3.7   No results for input(s): LIPASE, AMYLASE in  the last 168 hours. No results for input(s): AMMONIA in the last 168 hours.  CBC: Recent Labs  Lab 10/27/18 0352 10/27/18 0639 10/28/18 0014  WBC 6.0 4.4 3.5*  NEUTROABS 4.9  --   --   HGB 13.8 13.4 12.4*  HCT 42.9 40.5 37.2*  MCV 89.4 89.2 86.7  PLT 140* 134* 123*    Cardiac Enzymes: Recent Labs  Lab 10/27/18 0352 10/27/18 0639 10/27/18 1039 10/27/18 1732  TROPONINI 0.30* 0.33* 0.37* 0.33*    BNP: Invalid input(s): POCBNP  CBG: No results for input(s): GLUCAP in the last 168 hours.  Microbiology: Results for orders placed or performed during the hospital encounter of 10/27/18  SARS Coronavirus 2 (CEPHEID - Performed in [place] hospital lab), Hosp Order     Status: None   Collection Time: 10/27/18  3:52 AM  Result Value Ref Range Status   SARS Coronavirus 2 NEGATIVE NEGATIVE Final    Comment: (NOTE) If result is NEGATIVE SARS-CoV-2 target nucleic acids are NOT DETECTED. The SARS-CoV-2 RNA is generally detectable in upper and lower  respiratory specimens during the acute phase of infection. The lowest  concentration of SARS-CoV-2 viral copies this assay can detect is 250  copies / mL. A negative result does not preclude SARS-CoV-2 infection  and should not be used as the sole basis for treatment or other  patient management decisions.  A negative result may occur with  improper specimen collection /  handling, submission of specimen other  than nasopharyngeal swab, presence of viral mutation(s) within the  areas targeted by this assay, and inadequate number of viral copies  (<250 copies / mL). A negative result must be combined with clinical  observations, patient history, and epidemiological information. If result is POSITIVE SARS-CoV-2 target nucleic acids are DETECTED. The SARS-CoV-2 RNA is generally detectable in upper and lower  respiratory specimens dur ing the acute phase of infection.  Positive  results are indicative of active infection with  SARS-CoV-2.  Clinical  correlation with patient history and other diagnostic information is  necessary to determine patient infection status.  Positive results do  not rule out bacterial infection or co-infection with other viruses. If result is PRESUMPTIVE POSTIVE SARS-CoV-2 nucleic acids MAY BE PRESENT.   A presumptive positive result was obtained on the submitted specimen  and confirmed on repeat testing.  While 2019 novel coronavirus  (SARS-CoV-2) nucleic acids may be present in the submitted sample  additional confirmatory testing may be necessary for epidemiological  and / or clinical management purposes  to differentiate between  SARS-CoV-2 and other Sarbecovirus currently known to infect humans.  If clinically indicated additional testing with an alternate test  methodology [phone]) is advised. The SARS-CoV-2 RNA is generally  detectable in upper and lower respiratory sp ecimens during the acute  phase of infection. The expected result is Negative. Fact Sheet for Patients:  StrictlyIdeas.no Fact Sheet for Healthcare Providers: BankingDealers.co.za This test is not yet approved or cleared by the [place] FDA and has been authorized for detection and/or diagnosis of SARS-CoV-2 by FDA under an Emergency Use Authorization (EUA).  This EUA will remain in effect (meaning this test can be used) for the duration of the COVID-19 declaration under Section 564(b)(1) of the Act, 21 U.S.C. section 360bbb-3(b)(1), unless the authorization is terminated or revoked sooner. Performed at [facility], [address] [place], [place] [address]     Coagulation Studies: Recent Labs    10/27/18 0639  LABPROT 20.4*  INR 1.8*    Urinalysis: No results for input(s): COLORURINE, LABSPEC, PHURINE, GLUCOSEU, HGBUR, BILIRUBINUR, KETONESUR, PROTEINUR, UROBILINOGEN, NITRITE, LEUKOCYTESUR in the last 72 hours.  Invalid input(s):  APPERANCEUR    Imaging: US Renal  Result Date: 10/27/2018 CLINICAL DATA:  Acute kidney injury EXAM: RENAL / URINARY TRACT ULTRASOUND COMPLETE COMPARISON:  None. FINDINGS: Right Kidney: Renal measurements: 9.2 x 4.1 x 4.6 cm = volume: 92 mL. Renal cortex is echogenic and thin. 10 mm cyst within the midpole. No suspicious mass or hydronephrosis. Left Kidney: Renal measurements: 10.8 x 5.2 x 4.3 cm = volume: 128 mL. Renal cortex is echogenic. Cortex thickness is within normal limits. Small cysts within the midpole and lower pole, each measuring 12 mm. No suspicious mass or hydronephrosis. Bladder: Questionable bladder wall thickening, characterization limited by incomplete bladder distension. IMPRESSION: 1. Both kidneys are echogenic suggesting chronic medical renal disease. No hydronephrosis. 2. Questionable bladder wall thickening, characterization limited by incomplete bladder distension. Recommend correlation with urinalysis. Electronically Signed   By: Franki Cabot M.D.   On: 10/27/2018 09:42   US Venous Img Lower Bilateral  Result Date: 10/27/2018 CLINICAL DATA:  Lower extremity edema. History of peripheral vascular disease. EXAM: BILATERAL LOWER EXTREMITY VENOUS DOPPLER ULTRASOUND TECHNIQUE: Gray-scale sonography with compression, as well as color and duplex ultrasound, were performed to evaluate the deep venous system from the level of the common femoral vein through the popliteal and proximal calf veins. COMPARISON:  None FINDINGS:  Normal compressibility of the common femoral, superficial femoral, and popliteal veins, as well as the proximal calf veins. No filling defects to suggest DVT on grayscale or color Doppler imaging. Doppler waveforms show normal direction of venous flow, normal respiratory phasicity and response to augmentation. Subcutaneous edema in the calf, left greater than right. Subcentimeter morphologically unremarkable inguinal lymph nodes. IMPRESSION: No femoropopliteal and no calf  DVT in the visualized calf veins. If clinical symptoms are inconsistent or if there are persistent or worsening symptoms, further imaging (possibly involving the iliac veins) may be warranted. Electronically Signed   By: Lucrezia Europe M.D.   On: 10/27/2018 09:35   Dg Chest Port 1 View  Result Date: 10/28/2018 CLINICAL DATA:  Acute CHF EXAM: PORTABLE CHEST 1 VIEW COMPARISON:  Yesterday FINDINGS: Cardiomegaly and aortic tortuosity, stable. Interstitial coarsening without definite Kerley lines. No pleural effusion. No air bronchogram or pneumothorax. IMPRESSION: History of CHF with stable interstitial opacity. Suspect at least some of this opacity is from chronic lung disease. Electronically Signed   By: Monte Fantasia M.D.   On: 10/28/2018 04:32   Dg Chest Port 1 View  Result Date: 10/27/2018 CLINICAL DATA:  Shortness of breath EXAM: PORTABLE CHEST 1 VIEW COMPARISON:  None. FINDINGS: The cardiac silhouette is enlarged. There are diffuse prominent interstitial lung markings bilaterally. No pneumothorax. No large pleural effusion. There is mild elevation of the right hemidiaphragm. Aortic calcifications are noted. There is no acute osseous abnormality. Degenerative changes are noted of both shoulders. IMPRESSION: Cardiomegaly with prominent interstitial lung markings. Findings can be seen in patients with pulmonary edema versus an atypical infectious process. Electronically Signed   By: Constance Holster M.D.   On: 10/27/2018 03:55      Assessment & Plan: Mr. Trevionne Advani is a 85 y.o. black male with congestive heart failure, coronary artery disease, peripheral vascular disease, anemiawho was admitted to [facility] for Acute CHF (congestive heart failure) ([place]) [I50.9] AKI (acute kidney injury) ([place]) [N17.9] Edema of right lower extremity [R60.0] Chest pain, unspecified type [R07.9] Acute on chronic congestive heart failure, unspecified heart failure type ([place]) [I50.9]   1. Acute renal  failure: with chronic kidney disease stage NOS. Unknown baseline creatinine.  Acute renal failure secondary to acute cardiorenal syndrome  2. Acute exacerbation of systolic congestive heart failure  3. Hypertension  Plan Continue diuretics: indapamide and furosemide.  Check renal ultrasound Check urine studies   LOS: 1 Zanayah Shadowens 5/26/20204:31 PM

## 2018-10-28 NOTE — Plan of Care (Signed)
  Problem: Education: Goal: Ability to demonstrate management of disease process will improve Outcome: Progressing   Problem: Activity: Goal: Capacity to carry out activities will improve Outcome: Progressing   Problem: Activity: Goal: Risk for activity intolerance will decrease Outcome: Progressing   Problem: Safety: Goal: Ability to remain free from injury will improve Outcome: Progressing   Problem: Skin Integrity: Goal: Risk for impaired skin integrity will decrease Outcome: Progressing

## 2018-10-28 NOTE — Consult Note (Addendum)
Cardiology Consultation:   Patient ID: Alex Larson; [id number]; 1926/08/25   Admit date: 10/27/2018 Date of Consult: 10/28/2018  Primary Care Provider: Patient, No Pcp Per Primary Cardiologist: New to [facility] - consult by End   Patient Profile:   Alex Larson is a 85 y.o. male with a hx of CAD with remote PCI, HFrEF secondary to ICM, CKD, PAD, chronic respiratory failure with hypoxia on supplemental oxygen, and hypertension who is being seen today for the evaluation of CHF at the request of Dr. Brett Albino.  History of Present Illness:   Alex Larson recently relocated from [place], [place] to [place] approximately 4 months prior.  He indicates while residing in [place] he underwent remote PCI x2 with details being unclear as well as CHF and remote lower extremity bypass of the left leg.  He also indicates he was followed by nephrology for CKD.  Over the past several weeks to month the patient has noted increased shortness of breath and lower extremity swelling in the setting of running out of Lasix several months prior as he has not yet established with a physician since relocating to [place].  He denies any associated chest pain, orthopnea, PND, early satiety, dizziness, presyncope, or syncope.  Due to his worsening shortness of breath and lower extremity swelling he presented to [facility] where he was found to be significantly volume overloaded with a BNP greater than 4500.  Labs also showed acute renal failure with serum creatinine 3.55 with baseline being uncertain.  Troponin was found to be mildly elevated peaking at 0.37.  Chest x-ray concerning for pulmonary edema versus atypical infectious process.  COVID-19 was negative.  Echo showed significant cardiomyopathy with EF of 10 to 15% as detailed below.  Prior EF is not known though patient does report history of CHF as outlined above.  Since admission, he has been receiving IV Lasix 40 mg twice daily with a documented urine  output of 1.2 L for the past 24 hours with a net negative of less than 1 L for the admission.  He does indicate his shortness of breath is somewhat improved this morning.  Weight trend from 77.1 kg to 76.7 kg.  Past Medical History:  Diagnosis Date  . Renal disorder     History reviewed. No pertinent surgical history.   Home Meds: Prior to Admission medications   Medication Sig Start Date End Date Taking? Authorizing Provider  amLODipine (NORVASC) 5 MG tablet Take 5 mg by mouth daily.   Yes [provider]  carvedilol (COREG) 6.25 MG tablet Take 6.25 mg by mouth daily.    Yes [provider]  ferrous sulfate 220 (44 Fe) MG/5ML solution Take 220 mg by mouth daily.   Yes [provider]  furosemide (LASIX) 40 MG tablet Take 40 mg by mouth 2 (two) times daily.   Yes [provider]  indapamide (LOZOL) 2.5 MG tablet Take 2.5 mg by mouth daily. 30 minutes prior to furosemide   Yes [provider]  isosorbide mononitrate (ISMO) 10 MG tablet Take 10 mg by mouth daily.   Yes [provider]  multivitamin-iron-minerals-folic acid (CENTRUM) chewable tablet Chew 1 tablet by mouth daily.   Yes [provider]  pantoprazole (PROTONIX) 40 MG tablet Take 40 mg by mouth daily.   Yes [provider]    Inpatient Medications: Scheduled Meds: . aspirin EC  81 mg Oral Daily  . atorvastatin  40 mg Oral q1800  . [START ON  10/29/2018] carvedilol  3.125 mg Oral Daily  . ferrous sulfate  325 mg Oral Daily  . folic acid  1 mg Oral Daily  . furosemide  40 mg Intravenous BID  . indapamide  2.5 mg Oral Daily  . isosorbide mononitrate  10 mg Oral Daily  . multivitamin with minerals  1 tablet Oral Daily  . pantoprazole  40 mg Oral Daily  . sodium chloride flush  3 mL Intravenous Q12H   Continuous Infusions: . sodium chloride     PRN Meds: sodium chloride, acetaminophen, ondansetron (ZOFRAN) IV, sodium chloride flush  Allergies:  No  Known Allergies  Social History:   Social History   Socioeconomic History  . Marital status: Widowed    Spouse name: Not on file  . Number of children: Not on file  . Years of education: Not on file  . Highest education level: Not on file  Occupational History  . Not on file  Social Needs  . Financial resource strain: Not on file  . Food insecurity:    Worry: Not on file    Inability: Not on file  . Transportation needs:    Medical: Not on file    Non-medical: Not on file  Tobacco Use  . Smoking status: Unknown If Ever Smoked  . Smokeless tobacco: Former Network engineer and Sexual Activity  . Alcohol use: Not Currently  . Drug use: Never  . Sexual activity: Not on file  Lifestyle  . Physical activity:    Days per week: Not on file    Minutes per session: Not on file  . Stress: Not on file  Relationships  . Social connections:    Talks on phone: Not on file    Gets together: Not on file    Attends religious service: Not on file    Active member of club or organization: Not on file    Attends meetings of clubs or organizations: Not on file    Relationship status: Not on file  . Intimate partner violence:    Fear of current or ex partner: Not on file    Emotionally abused: Not on file    Physically abused: Not on file    Forced sexual activity: Not on file  Other Topics Concern  . Not on file  Social History Narrative  . Not on file     Family History:   History reviewed. No pertinent family history.   Awaiting discussion with patient's daughter for details of family history.  ROS:  Review of Systems  Constitutional: Positive for malaise/fatigue. Negative for chills, diaphoresis, fever and weight loss.  HENT: Negative for congestion.   Eyes: Negative for discharge and redness.  Respiratory: Positive for shortness of breath. Negative for cough, hemoptysis, sputum production and wheezing.   Cardiovascular: Positive for leg swelling. Negative for chest pain,  palpitations, orthopnea, claudication and PND.  Gastrointestinal: Negative for abdominal pain, blood in stool, heartburn, melena, nausea and vomiting.  Genitourinary: Negative for hematuria.  Musculoskeletal: Negative for falls and myalgias.  Skin: Negative for rash.  Neurological: Positive for weakness. Negative for dizziness, tingling, tremors, sensory change, speech change, focal weakness and loss of consciousness.  Endo/Heme/Allergies: Does not bruise/bleed easily.  Psychiatric/Behavioral: Negative for substance abuse. The patient is not nervous/anxious.   All other systems reviewed and are negative.     Physical Exam/Data:   Vitals:   10/27/18 2315 10/28/18 0510 10/28/18 0729 10/28/18 1101  BP: 119/76 133/84 134/82   Pulse: (!) 49 (!)  48 (!) 50   Resp:  20 16   Temp:  98.2 F (36.8 C) (!) 97.5 F (36.4 C)   TempSrc:  Oral Oral   SpO2: 99% 94% 99% 95%  Weight:  76.7 kg    Height:        Intake/Output Summary (Last 24 hours) at 10/28/2018 1213 Last data filed at 10/28/2018 0835 Gross per 24 hour  Intake 537.93 ml  Output 1400 ml  Net -862.07 ml   Filed Weights   10/27/18 0326 10/28/18 0510  Weight: 77.1 kg 76.7 kg   Body mass index is 21.7 kg/m.   Physical Exam: General: Well developed, well nourished, in no acute distress. Head: Normocephalic, atraumatic, sclera non-icteric, no xanthomas, nares without discharge.  Neck: Negative for carotid bruits. JVD elevated ~ 10 cm. Lungs: Diminished breath sounds bilaterally with crackles along the bilateral bases. Breathing is unlabored. Heart: Bradycardic, II/VI systolic murmur LLSB, with S1 S2. No murmurs, rubs, or gallops appreciated. Abdomen: Soft, non-tender, non-distended with normoactive bowel sounds. No hepatomegaly. No rebound/guarding. No obvious abdominal masses. Msk:  Strength and tone appear normal for age. Extremities: No clubbing or cyanosis. 1+ bilateral pitting edema along the lower extremities. Distal pedal  pulses are 2+ and equal bilaterally. Neuro: Alert and oriented X 3. No facial asymmetry. No focal deficit. Moves all extremities spontaneously. Psych:  Responds to questions appropriately with a normal affect.   EKG:  The EKG was personally reviewed and demonstrates: sinus bradycardia, 47 bpm, RBBB Telemetry:  Telemetry was personally reviewed and demonstrates: sinus bradycardia, 40s bpm, rare PAC/PVC  Weights: Filed Weights   10/27/18 0326 10/28/18 0510  Weight: 77.1 kg 76.7 kg    Relevant CV Studies: 2D Echo 10/28/2018: 1. The left ventricle has severely reduced systolic function, with an ejection fraction of 10-15%. The cavity size was normal. There is moderately increased left ventricular wall thickness. Left ventricular diastolic Doppler parameters are consistent with impaired relaxation. Left ventricular diffuse hypokinesis.  2. The right ventricle has severely reduced systolic function. The cavity was moderately enlarged. There is no increase in right ventricular wall thickness. Right ventricular systolic pressure is severely elevated with an estimated pressure of 70.4 mmHg.  3. Left atrial size was mildly dilated.  4. Right atrial size was moderately dilated.  5. Trivial pericardial effusion is present.  6. The mitral valve is degenerative. Mild thickening of the mitral valve leaflet.  7. Tricuspid valve regurgitation is moderate-severe.  8. The aortic valve is tricuspid. Severely thickening of the aortic valve. Sclerosis without any evidence of stenosis of the aortic valve.  9. The aortic root is normal in size and structure. 10. The inferior vena cava was dilated in size with <50% respiratory variability.  Laboratory Data:  Chemistry Recent Labs  Lab 10/27/18 0352 10/27/18 0639 10/28/18 0014  NA 143  --  143  K 4.2  --  4.5  CL 115*  --  116*  CO2 16*  --  18*  GLUCOSE 107*  --  80  BUN 58*  --  59*  CREATININE 3.55* 3.45* 3.35*  CALCIUM 10.0  --  9.4  GFRNONAA 14*  15* 15*  GFRAA 17* 17* 18*  ANIONGAP 12  --  9    Recent Labs  Lab 10/27/18 0352  PROT 6.7  ALBUMIN 3.7  AST 34  ALT 23  ALKPHOS 92  BILITOT 1.7*   Hematology Recent Labs  Lab 10/27/18 0352 10/27/18 0639 10/28/18 0014  WBC 6.0 4.4 3.5*  RBC 4.80 4.54 4.29  HGB 13.8 13.4 12.4*  HCT 42.9 40.5 37.2*  MCV 89.4 89.2 86.7  MCH 28.8 29.5 28.9  MCHC 32.2 33.1 33.3  RDW 20.5* 20.5* 20.2*  PLT 140* 134* 123*   Cardiac Enzymes Recent Labs  Lab 2018/11/20 0352 Nov 20, 2018 0639 11-20-2018 1039 20-Nov-2018 1732  TROPONINI 0.30* 0.33* 0.37* 0.33*   No results for input(s): TROPIPOC in the last 168 hours.  BNP Recent Labs  Lab Nov 20, 2018 0352 20-Nov-2018 0639  BNP >4,500.0* >4,500.0*    DDimer No results for input(s): DDIMER in the last 168 hours.  Radiology/Studies:  US Renal  Result Date: 20-Nov-2018 IMPRESSION: 1. Both kidneys are echogenic suggesting chronic medical renal disease. No hydronephrosis. 2. Questionable bladder wall thickening, characterization limited by incomplete bladder distension. Recommend correlation with urinalysis. Electronically Signed   By: Franki Cabot M.D.   On: 2018/11/20 09:42   US Venous Img Lower Bilateral  Result Date: 11/20/18 IMPRESSION: No femoropopliteal and no calf DVT in the visualized calf veins. If clinical symptoms are inconsistent or if there are persistent or worsening symptoms, further imaging (possibly involving the iliac veins) may be warranted. Electronically Signed   By: Lucrezia Europe M.D.   On: 11-20-2018 09:35   Dg Chest Port 1 View  Result Date: 10/28/2018 IMPRESSION: History of CHF with stable interstitial opacity. Suspect at least some of this opacity is from chronic lung disease. Electronically Signed   By: Monte Fantasia M.D.   On: 10/28/2018 04:32   Dg Chest Port 1 View  Result Date: November 20, 2018 IMPRESSION: Cardiomegaly with prominent interstitial lung markings. Findings can be seen in patients with pulmonary edema versus an  atypical infectious process. Electronically Signed   By: Constance Holster M.D.   On: November 20, 2018 03:55    Assessment and Plan:   1. Acute on chronic HFrEF: -Patient reports a history of CHF and was previously followed by cardiology and [facility], [place] secondary to [facility] -We are contacting the patient's daughter for details of the patient's medical history and prior care team with attempts to obtain prior records -He remains volume overloaded -Increase IV Lasix to 80 mg twice daily -If he continues to have sluggish diuresis and or worsening renal function with this he may need transfer to the ICU for augmentation of diuresis with milrinone -Decrease Coreg to 3.125 mg twice daily secondary to bradycardia -Discontinue amlodipine in the setting of his cardiomyopathy -Start BiDil -Not currently a candidate for ACE inhibitor, ARB, Entresto or spironolactone secondary to acute renal failure -CHF education -Daily weights with strict I's and O's -Doubt he is a candidate for device implantation secondary to advanced age and significant comorbid conditions  2.  Elevated troponin/CAD: -Mildly elevated with a peak of 0.37 -Never with chest pain -Patient reports a remote history of PCI x2 though states his bypass was not cardiac, rather lower extremity in etiology -Echo with cardiomyopathy as above though patient indicates this is not new for him -Not currently a cath candidate given his renal injury -Continue current medical management including aspirin, Coreg, Lipitor, and isosorbide -Heparin discontinued by primary service this morning  4.  Acute on CKD: -Stage and baseline are uncertain -Avoid nephrotoxic agents -Recommend internal medicine consult nephrology for further input -Renal ultrasound demonstrating no hydronephrosis with changes consistent with chronic renal disease -May need augmentation diuresis as above  5.  PAD: -Stable -Prior remote lower extremity bypass of the  left lower extremity -Outpatient follow-up   6.  Sinus bradycardia: -  Decrease Coreg to 3.125 mg twice daily -Ambulate to assess for appropriate chronotropic competence -Check TSH  7.  Thrombocytopenia: -Consider HIT antibody  8.  Confusion: -Baseline uncertain -Per IM   For questions or updates, please contact [place] Please consult [email or web address] for contact info under Cardiology/STEMI.   Signed, Christell Faith, PA-C [place] Pager: [phone] 10/28/2018, 12:13 PM

## 2018-10-28 NOTE — Consult Note (Signed)
ANTICOAGULATION CONSULT NOTE  Pharmacy Consult for Heparin Indication: chest pain/ACS  No Known Allergies  Patient Measurements: Height: 6\' 2"  (188 cm) Weight: 169 lb (76.7 kg) IBW/kg (Calculated) : 82.2 Heparin Dosing Weight: 77.1 kg  Vital Signs: Temp: 97.5 F (36.4 C) (05/26 0729) Temp Source: Oral (05/26 0729) BP: 134/82 (05/26 0729) Pulse Rate: 50 (05/26 0729)  Labs: Recent Labs    10/27/18 0352 10/27/18 8502 10/27/18 1039 10/27/18 1418 10/27/18 1732 10/27/18 2210 10/28/18 0014 10/28/18 0657  HGB 13.8 13.4  --   --   --   --  12.4*  --   HCT 42.9 40.5  --   --   --   --  37.2*  --   PLT 140* 134*  --   --   --   --  123*  --   APTT  --  >160*  --   --   --   --   --   --   LABPROT  --  20.4*  --   --   --   --   --   --   INR  --  1.8*  --   --   --   --   --   --   HEPARINUNFRC  --   --   --  0.66  --  0.72*  --  0.71*  CREATININE 3.55* 3.45*  --   --   --   --  3.35*  --   TROPONINI 0.30* 0.33* 0.37*  --  0.33*  --   --   --     Estimated Creatinine Clearance: 15.9 mL/min (A) (by C-G formula based on SCr of 3.35 mg/dL (H)).   Medical History: Past Medical History:  Diagnosis Date  . Renal disorder     Medications:  Medications Prior to Admission  Medication Sig Dispense Refill Last Dose  . amLODipine (NORVASC) 5 MG tablet Take 5 mg by mouth daily.   10/26/2018 at Unknown time  . carvedilol (COREG) 6.25 MG tablet Take 6.25 mg by mouth daily.    10/26/2018 at Unknown time  . ferrous sulfate 220 (44 Fe) MG/5ML solution Take 220 mg by mouth daily.   10/26/2018 at Unknown time  . furosemide (LASIX) 40 MG tablet Take 40 mg by mouth 2 (two) times daily.   10/26/2018 at Unknown time  . indapamide (LOZOL) 2.5 MG tablet Take 2.5 mg by mouth daily. 30 minutes prior to furosemide   10/26/2018 at Unknown time  . isosorbide mononitrate (ISMO) 10 MG tablet Take 10 mg by mouth daily.   10/26/2018 at Unknown time  . multivitamin-iron-minerals-folic acid (CENTRUM) chewable  tablet Chew 1 tablet by mouth daily.   10/26/2018 at Unknown time  . pantoprazole (PROTONIX) 40 MG tablet Take 40 mg by mouth daily.   10/26/2018 at Unknown time   Scheduled:  . amLODipine  5 mg Oral Daily  . aspirin EC  81 mg Oral Daily  . atorvastatin  40 mg Oral q1800  . carvedilol  6.25 mg Oral Daily  . ferrous sulfate  325 mg Oral Daily  . folic acid  1 mg Oral Daily  . furosemide  40 mg Intravenous BID  . indapamide  2.5 mg Oral Daily  . isosorbide mononitrate  10 mg Oral Daily  . multivitamin with minerals  1 tablet Oral Daily  . pantoprazole  40 mg Oral Daily  . sodium chloride flush  3 mL Intravenous Q12H   Infusions:  .  sodium chloride    . heparin 950 Units/hr (10/28/18 0418)   PRN: sodium chloride, acetaminophen, ondansetron (ZOFRAN) IV, sodium chloride flush  Assessment: Pharmacy consulted to start heparin for ACS. No note of DOAC PTA.   Troponin 0.30>>0.33 >> 0.37 >. 0.33  Pt was started given 4000 unit bolus and started an infusion at 1000 units/hr.  5/25 1418 HL 0.66 - continued current rate.  5/25 2210 HL 0.72 - elevated will decrease rate by 1 unit/kg/hr.  5/26 0719  HL 0.71- supratherapeutic; will need to decrease rate by 1 unit/kg/hr.    Goal of Therapy:  Heparin level 0.3-0.7 units/ml Monitor platelets by anticoagulation protocol: Yes   Plan:  Heparin level is slightly supratherapeutic. Will decrease rate to 850 units/hr.  Check anti-Xa level in 8 hours and CBC daily while on heparin Continue to monitor H&H and platelets    Rowland Lathe, PharmD 10/28/2018,7:56 AM

## 2018-10-28 NOTE — Progress Notes (Signed)
[facility] [place] NAME: Alex Larson    MR#:  [id number]  DATE OF BIRTH:  [birth year]  SUBJECTIVE:   Sob has improved  REVIEW OF SYSTEMS:    Review of Systems  Constitutional: Negative for fever, chills weight loss HENT: Negative for ear pain, nosebleeds, congestion, facial swelling, rhinorrhea, neck pain, neck stiffness and ear discharge.   Respiratory: Negative for cough, shortness of breath (IMPROVED), wheezing  Cardiovascular: Negative for chest pain, palpitations and ++ leg swelling.  Gastrointestinal: Negative for heartburn, abdominal pain, vomiting, diarrhea or consitpation Genitourinary: Negative for dysuria, urgency, frequency, hematuria Musculoskeletal: Negative for back pain or joint pain Neurological: Negative for dizziness, seizures, syncope, focal weakness,  numbness and headaches.  Hematological: Does not bruise/bleed easily.  Psychiatric/Behavioral: Negative for hallucinations, confusion, dysphoric mood    Tolerating Diet: yes      DRUG ALLERGIES:  No Known Allergies  VITALS:  Blood pressure 134/82, pulse (!) 50, temperature (!) 97.5 F (36.4 C), temperature source Oral, resp. rate 16, height 6\' 2"  (1.88 m), weight 76.7 kg, SpO2 95 %.  PHYSICAL EXAMINATION:  Constitutional: Appears well-developed and well-nourished. No distress. HENT: Normocephalic. Marland Kitchen Oropharynx is clear and moist.  Eyes: Conjunctivae and EOM are normal. PERRLA, no scleral icterus.  Neck: Normal ROM. Neck supple. ++ JVD. No tracheal deviation. CVS: RRR, S1/S2 +, 2/6 murmurs, no gallops, no carotid bruit.  Pulmonary: Effort and breath sounds normal, no stridor, rhonchi, wheezes, rales.  Abdominal: Soft. BS +,  no distension, tenderness, rebound or guarding.  Musculoskeletal: Normal range of motion. 1+ LEE  Neuro: Alert. CN 2-12 grossly intact. No focal deficits. Skin: Skin is warm and dry. No rash noted. Psychiatric: Normal mood and  affect.      LABORATORY PANEL:   CBC Recent Labs  Lab 10/28/18 0014  WBC 3.5*  HGB 12.4*  HCT 37.2*  PLT 123*   ------------------------------------------------------------------------------------------------------------------  Chemistries  Recent Labs  Lab 10/27/18 0352  10/28/18 0014  NA 143  --  143  K 4.2  --  4.5  CL 115*  --  116*  CO2 16*  --  18*  GLUCOSE 107*  --  80  BUN 58*  --  59*  CREATININE 3.55*   < > 3.35*  CALCIUM 10.0  --  9.4  MG  --   --  1.8  AST 34  --   --   ALT 23  --   --   ALKPHOS 92  --   --   BILITOT 1.7*  --   --    < > = values in this interval not displayed.   ------------------------------------------------------------------------------------------------------------------  Cardiac Enzymes Recent Labs  Lab 10/27/18 0639 10/27/18 1039 10/27/18 1732  TROPONINI 0.33* 0.37* 0.33*   ------------------------------------------------------------------------------------------------------------------  RADIOLOGY:  US Renal  Result Date: 10/27/2018 CLINICAL DATA:  Acute kidney injury EXAM: RENAL / URINARY TRACT ULTRASOUND COMPLETE COMPARISON:  None. FINDINGS: Right Kidney: Renal measurements: 9.2 x 4.1 x 4.6 cm = volume: 92 mL. Renal cortex is echogenic and thin. 10 mm cyst within the midpole. No suspicious mass or hydronephrosis. Left Kidney: Renal measurements: 10.8 x 5.2 x 4.3 cm = volume: 128 mL. Renal cortex is echogenic. Cortex thickness is within normal limits. Small cysts within the midpole and lower pole, each measuring 12 mm. No suspicious mass or hydronephrosis. Bladder: Questionable bladder wall thickening, characterization limited by incomplete bladder distension. IMPRESSION: 1. Both kidneys are echogenic suggesting chronic medical renal disease.  No hydronephrosis. 2. Questionable bladder wall thickening, characterization limited by incomplete bladder distension. Recommend correlation with urinalysis. Electronically Signed   By:  Franki Cabot M.D.   On: 10/27/2018 09:42   US Venous Img Lower Bilateral  Result Date: 10/27/2018 CLINICAL DATA:  Lower extremity edema. History of peripheral vascular disease. EXAM: BILATERAL LOWER EXTREMITY VENOUS DOPPLER ULTRASOUND TECHNIQUE: Gray-scale sonography with compression, as well as color and duplex ultrasound, were performed to evaluate the deep venous system from the level of the common femoral vein through the popliteal and proximal calf veins. COMPARISON:  None FINDINGS: Normal compressibility of the common femoral, superficial femoral, and popliteal veins, as well as the proximal calf veins. No filling defects to suggest DVT on grayscale or color Doppler imaging. Doppler waveforms show normal direction of venous flow, normal respiratory phasicity and response to augmentation. Subcutaneous edema in the calf, left greater than right. Subcentimeter morphologically unremarkable inguinal lymph nodes. IMPRESSION: No femoropopliteal and no calf DVT in the visualized calf veins. If clinical symptoms are inconsistent or if there are persistent or worsening symptoms, further imaging (possibly involving the iliac veins) may be warranted. Electronically Signed   By: Lucrezia Europe M.D.   On: 10/27/2018 09:35   Dg Chest Port 1 View  Result Date: 10/28/2018 CLINICAL DATA:  Acute CHF EXAM: PORTABLE CHEST 1 VIEW COMPARISON:  Yesterday FINDINGS: Cardiomegaly and aortic tortuosity, stable. Interstitial coarsening without definite Kerley lines. No pleural effusion. No air bronchogram or pneumothorax. IMPRESSION: History of CHF with stable interstitial opacity. Suspect at least some of this opacity is from chronic lung disease. Electronically Signed   By: Monte Fantasia M.D.   On: 10/28/2018 04:32   Dg Chest Port 1 View  Result Date: 10/27/2018 CLINICAL DATA:  Shortness of breath EXAM: PORTABLE CHEST 1 VIEW COMPARISON:  None. FINDINGS: The cardiac silhouette is enlarged. There are diffuse prominent  interstitial lung markings bilaterally. No pneumothorax. No large pleural effusion. There is mild elevation of the right hemidiaphragm. Aortic calcifications are noted. There is no acute osseous abnormality. Degenerative changes are noted of both shoulders. IMPRESSION: Cardiomegaly with prominent interstitial lung markings. Findings can be seen in patients with pulmonary edema versus an atypical infectious process. Electronically Signed   By: Constance Holster M.D.   On: 10/27/2018 03:55     ASSESSMENT AND PLAN:   85 year old male with history of CAD, chronic systolic heart failure, chronic kidney disease stage III and chronic hypoxic respiratory failure on 2 L of oxygen at home who presented to the emergency room due to shortness of breath and lower extremity edema.   1.  Acute on chronic systolic heart failure: Continue IV Lasix 80 mg twice daily CHF clinic upon discharge Not currently on ACE inhibitor/ARB due to acute kidney injury Continue to monitor intake and output Daily weight Cardiology consultation appreciated Coreg dose decreased  2.  Elevated troponin due to demand ischemia history of CAD: Continue aspirin, Coreg, Lipitor and isosorbide  2D Echo 10/28/2018: 1. The left ventricle has severely reduced systolic function, with an ejection fraction of 10-15%. The cavity size was normal. There is moderately increased left ventricular wall thickness. Left ventricular diastolic Doppler parameters are consistent with impaired relaxation. Left ventricular diffuse hypokinesis. 2. The right ventricle has severely reduced systolic function. The cavity was moderately enlarged. There is no increase in right ventricular wall thickness. Right ventricular systolic pressure is severely elevated with an estimated pressure of 70.4 mmHg. 3. Left atrial size was mildly dilated. 4. Right atrial  size was moderately dilated. 5. Trivial pericardial effusion is present. 6. The mitral valve is  degenerative. Mild thickening of the mitral valve leaflet. 7. Tricuspid valve regurgitation is moderate-severe. 8. The aortic valve is tricuspid. Severely thickening of the aortic valve. Sclerosis without any evidence of stenosis of the aortic valve. 9. The aortic root is normal in size and structure. 10. The inferior vena cava was dilated in size with <50% respiratory variability.   3.  Acute on chronic kidney disease due to CHF exacerbation Renal ultrasound as above which shows no hydronephrosis Nephrology consultation  4.  Essential hypertension: Continue BiDil and lower dose of Coreg   Management plans discussed with the patient and he is in agreement.  CODE STATUS: full  TOTAL TIME TAKING CARE OF THIS PATIENT: 30 minutes.     POSSIBLE D/C 2-4 days, DEPENDING ON CLINICAL CONDITION.   Bettey Costa M.D on 10/28/2018 at 1:42 PM  Between 7am to 6pm - Pager - [phone] After 6pm go to [email or web address] - password [email or web address] [facility]  Office  [phone]  CC: Primary care physician; Patient, No Pcp Per  Note: This dictation was prepared with Dragon dictation along with smaller phrase technology. Any transcriptional errors that result from this process are unintentional.

## 2018-10-29 DIAGNOSIS — N189 Chronic kidney disease, unspecified: Secondary | ICD-10-CM

## 2018-10-29 DIAGNOSIS — I2510 Atherosclerotic heart disease of native coronary artery without angina pectoris: Secondary | ICD-10-CM

## 2018-10-29 DIAGNOSIS — N185 Chronic kidney disease, stage 5: Secondary | ICD-10-CM

## 2018-10-29 DIAGNOSIS — I5043 Acute on chronic combined systolic (congestive) and diastolic (congestive) heart failure: Secondary | ICD-10-CM

## 2018-10-29 DIAGNOSIS — J9611 Chronic respiratory failure with hypoxia: Secondary | ICD-10-CM

## 2018-10-29 DIAGNOSIS — N184 Chronic kidney disease, stage 4 (severe): Secondary | ICD-10-CM

## 2018-10-29 DIAGNOSIS — E039 Hypothyroidism, unspecified: Secondary | ICD-10-CM

## 2018-10-29 DIAGNOSIS — J9621 Acute and chronic respiratory failure with hypoxia: Secondary | ICD-10-CM

## 2018-10-29 LAB — CBC
HCT: 33.5 % — ABNORMAL LOW (ref 39.0–52.0)
Hemoglobin: 11.4 g/dL — ABNORMAL LOW (ref 13.0–17.0)
MCH: 28.9 pg (ref 26.0–34.0)
MCHC: 34 g/dL (ref 30.0–36.0)
MCV: 85 fL (ref 80.0–100.0)
Platelets: 122 10*3/uL — ABNORMAL LOW (ref 150–400)
RBC: 3.94 MIL/uL — ABNORMAL LOW (ref 4.22–5.81)
RDW: 19.7 % — ABNORMAL HIGH (ref 11.5–15.5)
WBC: 3.5 10*3/uL — ABNORMAL LOW (ref 4.0–10.5)
nRBC: 0.6 % — ABNORMAL HIGH (ref 0.0–0.2)

## 2018-10-29 LAB — BASIC METABOLIC PANEL
Anion gap: 8 (ref 5–15)
BUN: 64 mg/dL — ABNORMAL HIGH (ref 8–23)
CO2: 19 mmol/L — ABNORMAL LOW (ref 22–32)
Calcium: 9.2 mg/dL (ref 8.9–10.3)
Chloride: 113 mmol/L — ABNORMAL HIGH (ref 98–111)
Creatinine, Ser: 3.52 mg/dL — ABNORMAL HIGH (ref 0.61–1.24)
GFR calc Af Amer: 17 mL/min — ABNORMAL LOW (ref >60)
GFR calc non Af Amer: 14 mL/min — ABNORMAL LOW (ref >60)
Glucose, Bld: 86 mg/dL (ref 70–99)
Potassium: 4 mmol/L (ref 3.5–5.1)
Sodium: 140 mmol/L (ref 135–145)

## 2018-10-29 NOTE — Plan of Care (Signed)
  Problem: Cardiac: Goal: Ability to achieve and maintain adequate cardiopulmonary perfusion will improve Outcome: Progressing   Problem: Clinical Measurements: Goal: Ability to maintain clinical measurements within normal limits will improve Outcome: Progressing   Problem: Safety: Goal: Ability to remain free from injury will improve Outcome: Progressing   Problem: Clinical Measurements: Goal: Respiratory complications will improve Outcome: Not Progressing Note:  Patient acutely on 3L of oxygen

## 2018-10-29 NOTE — Progress Notes (Signed)
Progress Note  Patient Name: Alex Larson Date of Encounter: 10/29/2018  Primary Cardiologist: [place] well this morning.  No significant shortness of breath, though Alex Larson has yet to get out of bed.  No chest pain, palpitations, or lightheadedness.  No significant orthopnea.  Leg swelling has improved.  Baseline oxygen requirement is 2 L; he remains on 4 L via nasal cannula.  Inpatient Medications    Scheduled Meds:  aspirin EC  81 mg Oral Daily   atorvastatin  40 mg Oral q1800   carvedilol  3.125 mg Oral Daily   enoxaparin (LOVENOX) injection  30 mg Subcutaneous Q24H   ferrous sulfate  325 mg Oral Daily   folic acid  1 mg Oral Daily   furosemide  80 mg Intravenous BID   indapamide  2.5 mg Oral Daily   isosorbide-hydrALAZINE  1 tablet Oral TID   levothyroxine  25 mcg Oral QAC breakfast   multivitamin with minerals  1 tablet Oral Daily   pantoprazole  40 mg Oral Daily   sodium chloride flush  3 mL Intravenous Q12H   Continuous Infusions:  sodium chloride     PRN Meds: sodium chloride, acetaminophen, ondansetron (ZOFRAN) IV, sodium chloride flush   Vital Signs    Vitals:   10/28/18 1925 10/29/18 0423 10/29/18 0600 10/29/18 0757  BP: 117/68 94/63  110/63  Pulse: (!) 52 (!) 51  (!) 50  Resp: 20 20  20   Temp: 98 F (36.7 C) 97.9 F (36.6 C)  (!) 97.5 F (36.4 C)  TempSrc: Oral Oral  Oral  SpO2: 96% 100% 92% 97%  Weight:  77.5 kg    Height:        Intake/Output Summary (Last 24 hours) at 10/29/2018 0957 Last data filed at 10/29/2018 0855 Gross per 24 hour  Intake --  Output 1315 ml  Net -1315 ml   Last 3 Weights 10/29/2018 10/28/2018 10/27/2018  Weight (lbs) 170 lb 12.8 oz 169 lb 170 lb  Weight (kg) 77.474 kg 76.658 kg 77.111 kg      Telemetry    Sinus bradycardia with PACs. - Personally Reviewed  ECG    No new tracing.  Physical Exam   GEN: No acute distress.   Neck:  JVP approximately 10 to 12  cm. Cardiac:  Bradycardic but regular with 2/6 holosystolic murmur heard across the precordium.  No rubs or gallops. Respiratory:  Normal work of breathing.  Mildly diminished breath sounds at the lung bases.  No wheezes or crackles. GI: Soft, nontender, non-distended  MS:  2+ pretibial edema bilaterally; No deformity. Neuro:  Nonfocal  Psych: Normal affect   Labs    Chemistry Recent Labs  Lab 10/27/18 0352 10/27/18 0639 10/28/18 0014 10/29/18 0501  NA 143  --  143 140  K 4.2  --  4.5 4.0  CL 115*  --  116* 113*  CO2 16*  --  18* 19*  GLUCOSE 107*  --  80 86  BUN 58*  --  59* 64*  CREATININE 3.55* 3.45* 3.35* 3.52*  CALCIUM 10.0  --  9.4 9.2  PROT 6.7  --   --   --   ALBUMIN 3.7  --   --   --   AST 34  --   --   --   ALT 23  --   --   --   ALKPHOS 92  --   --   --  BILITOT 1.7*  --   --   --   GFRNONAA 14* 15* 15* 14*  GFRAA 17* 17* 18* 17*  ANIONGAP 12  --  9 8     Hematology Recent Labs  Lab 10/27/18 0639 10/28/18 0014 10/29/18 0501  WBC 4.4 3.5* 3.5*  RBC 4.54 4.29 3.94*  HGB 13.4 12.4* 11.4*  HCT 40.5 37.2* 33.5*  MCV 89.2 86.7 85.0  MCH 29.5 28.9 28.9  MCHC 33.1 33.3 34.0  RDW 20.5* 20.2* 19.7*  PLT 134* 123* 122*    Cardiac Enzymes Recent Labs  Lab 10/27/18 0352 10/27/18 0639 10/27/18 1039 10/27/18 1732  TROPONINI 0.30* 0.33* 0.37* 0.33*   No results for input(s): TROPIPOC in the last 168 hours.   BNP Recent Labs  Lab 10/27/18 0352 10/27/18 0639  BNP >4,500.0* >4,500.0*     DDimer No results for input(s): DDIMER in the last 168 hours.   Radiology    Dg Chest Port 1 View  Result Date: 10/28/2018 CLINICAL DATA:  Acute CHF EXAM: PORTABLE CHEST 1 VIEW COMPARISON:  Yesterday FINDINGS: Cardiomegaly and aortic tortuosity, stable. Interstitial coarsening without definite Kerley lines. No pleural effusion. No air bronchogram or pneumothorax. IMPRESSION: History of CHF with stable interstitial opacity. Suspect at least some of this opacity is  from chronic lung disease. Electronically Signed   By: Monte Fantasia M.D.   On: 10/28/2018 04:32    Cardiac Studies   Echocardiogram (10/28/2018):  1. The left ventricle has severely reduced systolic function, with an ejection fraction of 10-15%. The cavity size was normal. There is moderately increased left ventricular wall thickness. Left ventricular diastolic Doppler parameters are consistent  with impaired relaxation. Left ventricular diffuse hypokinesis.  2. The right ventricle has severely reduced systolic function. The cavity was moderately enlarged. There is no increase in right ventricular wall thickness. Right ventricular systolic pressure is severely elevated with an estimated pressure of 70.4  mmHg.  3. Left atrial size was mildly dilated.  4. Right atrial size was moderately dilated.  5. Trivial pericardial effusion is present.  6. The mitral valve is degenerative. Mild thickening of the mitral valve leaflet.  7. Tricuspid valve regurgitation is moderate-severe.  8. The aortic valve is tricuspid. Severely thickening of the aortic valve. Sclerosis without any evidence of stenosis of the aortic valve.  9. The aortic root is normal in size and structure. 10. The inferior vena cava was dilated in size with <50% respiratory variability.  Patient Profile     85 y.o. male  with history of CAD status post PCI x2 (details unknown), chronic HFrEF, CKD (uncertain baseline creatinine), PAD status post right lower extremity bypass, chronic respiratory failure with hypoxia on supplemental oxygen, and hypertension, admitted with acute on chronic systolic and diastolic heart failure.  Assessment & Plan    Acute on chronic systolic and diastolic heart failure: Symptomatically, patient continues to improve.  However, his weight is essentially unchanged since yesterday.  His net fluid balance indicates that he is negative approximately 2 L since admission, though intake is incompletely  charted.  Continue diuresis with furosemide 80 mg IV twice daily and adjunctive indapamide 2.5 mg daily.  Continue carvedilol 3.125 mg daily.  No further escalation in the setting of resting bradycardia.  Continue BiDil.  No escalation in dose at this time, given soft blood pressure.  ACE inhibitor/ARB and aldosterone antagonist precluded by soft blood pressure and renal insufficiency.  No plans for ischemia work-up at this time, given risk for worsening  of renal function with contrast exposure.  Acute on chronic respiratory failure with hypoxia: Patient reports improved shortness of breath, though he is on 4 L at this time (baseline 2 L via nasal cannula).  Continue diuresis, as above.  Wean supplemental oxygen, as tolerated.  Patient to ambulate to assess oxygen requirement and symptoms when possible.  Hypothyroidism: An element of the patient's symptoms and edema may be reflective of myxedema, given markedly elevated TSH.  Levothyroxine 25 mcg daily started yesterday.  Continue levothyroxine 25 mcg daily.  Careful dose escalation over the course of several weeks to months will likely be needed.  Coronary artery disease: No symptoms to suggest worsening coronary insufficiency.  Details of prior revascularization are unclear.  Continue aspirin and atorvastatin for secondary prevention.  No plans for ischemia evaluation given comorbidities and age.  Chronic kidney disease: Baseline creatinine uncertain, though patient has a history of CKD.  Nephrology is following.  Continue diuresis, as above.  Avoid nephrotoxic drugs.  Thrombocytopenia: Mild and stable with uncertain chronicity.  Defer work-up to internal medicine.  For questions or updates, please contact [place] Please consult [email or web address] for contact info under [facility] Card[facility].     Signed, Nelva Bush, MD  10/29/2018, 9:57 AM

## 2018-10-29 NOTE — Progress Notes (Signed)
[facility] at [place] NAME: Alex Larson    MR#:  [id number]  DATE OF BIRTH:  07/21/1926  SUBJECTIVE:  Feels less SOB no CP or orthopnea   REVIEW OF SYSTEMS:    Review of Systems  Constitutional: Negative for fever, chills weight loss HENT: Negative for ear pain, nosebleeds, congestion, facial swelling, rhinorrhea, neck pain, neck stiffness and ear discharge.   Respiratory: Negative for cough, shortness of breath (IMPROVED), no wheezing  Cardiovascular: Negative for chest pain, palpitations and ++ leg swelling.  Gastrointestinal: Negative for heartburn, abdominal pain, vomiting, diarrhea or consitpation Genitourinary: Negative for dysuria, urgency, frequency, hematuria Musculoskeletal: Negative for back pain or joint pain Neurological: Negative for dizziness, seizures, syncope, focal weakness,  numbness and headaches.  Hematological: Does not bruise/bleed easily.  Psychiatric/Behavioral: Negative for hallucinations, confusion, dysphoric mood    Tolerating Diet: yes      DRUG ALLERGIES:  No Known Allergies  VITALS:  Blood pressure 110/63, pulse (!) 50, temperature (!) 97.5 F (36.4 C), temperature source Oral, resp. rate 20, height 6\' 2"  (1.88 m), weight 77.5 kg, SpO2 97 %.  PHYSICAL EXAMINATION:  Constitutional: Appears well-developed and well-nourished. No distress. HENT: Normocephalic. Marland Kitchen Oropharynx is clear and moist.  Eyes: Conjunctivae and EOM are normal. PERRLA, no scleral icterus.  Neck: Normal ROM. Neck supple. ++ JVD. No tracheal deviation. CVS: RRR, S1/S2 +, 2/6 murmurs, no gallops, no carotid bruit.  Pulmonary: Effort and breath sounds normal, no stridor, rhonchi, wheezes, rales.  Abdominal: Soft. BS +,  no distension, tenderness, rebound or guarding.  Musculoskeletal: Normal range of motion. 1+ LEE  Neuro: Alert. CN 2-12 grossly intact. No focal deficits. Skin: Skin is warm and dry. No rash noted. Psychiatric:  Normal mood and affect.      LABORATORY PANEL:   CBC Recent Labs  Lab 10/29/18 0501  WBC 3.5*  HGB 11.4*  HCT 33.5*  PLT 122*   ------------------------------------------------------------------------------------------------------------------  Chemistries  Recent Labs  Lab 10/27/18 0352  10/28/18 0014 10/29/18 0501  NA 143  --  143 140  K 4.2  --  4.5 4.0  CL 115*  --  116* 113*  CO2 16*  --  18* 19*  GLUCOSE 107*  --  80 86  BUN 58*  --  59* 64*  CREATININE 3.55*   < > 3.35* 3.52*  CALCIUM 10.0  --  9.4 9.2  MG  --   --  1.8  --   AST 34  --   --   --   ALT 23  --   --   --   ALKPHOS 92  --   --   --   BILITOT 1.7*  --   --   --    < > = values in this interval not displayed.   ------------------------------------------------------------------------------------------------------------------  Cardiac Enzymes Recent Labs  Lab 10/27/18 0639 10/27/18 1039 10/27/18 1732  TROPONINI 0.33* 0.37* 0.33*   ------------------------------------------------------------------------------------------------------------------  RADIOLOGY:  Dg Chest Port 1 View  Result Date: 10/28/2018 CLINICAL DATA:  Acute CHF EXAM: PORTABLE CHEST 1 VIEW COMPARISON:  Yesterday FINDINGS: Cardiomegaly and aortic tortuosity, stable. Interstitial coarsening without definite Kerley lines. No pleural effusion. No air bronchogram or pneumothorax. IMPRESSION: History of CHF with stable interstitial opacity. Suspect at least some of this opacity is from chronic lung disease. Electronically Signed   By: Monte Fantasia M.D.   On: 10/28/2018 04:32     ASSESSMENT AND PLAN:   85 year old male  with history of CAD, chronic systolic heart failure, chronic kidney disease stage III and chronic hypoxic respiratory failure on 2 L of oxygen at home who presented to the emergency room due to shortness of breath and lower extremity edema.   1.  Acut eon chronic toxic respiratory failure in the setting of acute  on chronic systolic heart failure EF 10-15%: Continue IV Lasix 80 mg twice daily and INDAPAMIDE CHF clinic upon discharge Not currently on ACE inhibitor/ARB/entresto due to acute kidney injury Continue to monitor intake and output Daily weight Cardiology consultation appreciated Continue Bidil Wean oxygen to 2 L baseline as tolerated.   2.  Elevated troponin due to demand ischemia history of CAD: Continue comanagement with aspirin and atorvastatin.   2D Echo 10/28/2018: 1. The left ventricle has severely reduced systolic function, with an ejection fraction of 10-15%. The cavity size was normal. There is moderately increased left ventricular wall thickness. Left ventricular diastolic Doppler parameters are consistent with impaired relaxation. Left ventricular diffuse hypokinesis. 2. The right ventricle has severely reduced systolic function. The cavity was moderately enlarged. There is no increase in right ventricular wall thickness. Right ventricular systolic pressure is severely elevated with an estimated pressure of 70.4 mmHg. 3. Left atrial size was mildly dilated. 4. Right atrial size was moderately dilated. 5. Trivial pericardial effusion is present. 6. The mitral valve is degenerative. Mild thickening of the mitral valve leaflet. 7. Tricuspid valve regurgitation is moderate-severe. 8. The aortic valve is tricuspid. Severely thickening of the aortic valve. Sclerosis without any evidence of stenosis of the aortic valve. 9. The aortic root is normal in size and structure. 10. The inferior vena cava was dilated in size with <50% respiratory variability.   3.  Acute on chronic kidney disease due to CHF exacerbation Renal ultrasound as above which shows no hydronephrosis Nephrology consultation appreciated. Avoid nephrotoxic medications 4.  Essential hypertension: Continue BiDil and lower dose of Coreg  5.  Hypothyroidism (new diagnosis): Started on Synthroid 25 mcg  daily. Repeat TFTs in 6 weeks Management plans discussed with the patient and he is in agreement.  CODE STATUS: full  TOTAL TIME TAKING CARE OF THIS PATIENT: 24 minutes.     POSSIBLE D/C 2-4 days, DEPENDING ON CLINICAL CONDITION.   Bettey Costa M.D on 10/29/2018 at 11:38 AM  Between 7am to 6pm - Pager - [phone] After 6pm go to [email or web address] - password [email or web address] [facility]  Office  [phone]  CC: Primary care physician; Patient, No Pcp Per  Note: This dictation was prepared with Dragon dictation along with smaller phrase technology. Any transcriptional errors that result from this process are unintentional.

## 2018-10-29 NOTE — Plan of Care (Signed)
  Problem: Education: Goal: Ability to demonstrate management of disease process will improve Outcome: Progressing   Problem: Clinical Measurements: Goal: Respiratory complications will improve Outcome: Progressing Note:  Pt weaned down to 2.5L and tolerating well.    Problem: Activity: Goal: Risk for activity intolerance will decrease Outcome: Progressing Note:  Patient in chair with assistance.

## 2018-10-29 NOTE — Progress Notes (Signed)
Spoke to [employer] in [place]. Baseline creatinine is 3.7, GFR of 19 on 09/11/2018. He was also being treated for hypercalcemia.   Janeisha Ryle

## 2018-10-29 NOTE — Progress Notes (Signed)
[facility]  ROUNDING NOTE   Subjective:   UOP 1368mL  Patient with no complaints.   Objective:  Vital signs in last 24 hours:  Temp:  [97.5 F (36.4 C)-98 F (36.7 C)] 97.5 F (36.4 C) (05/27 0757) Pulse Rate:  [48-52] 52 (05/27 1530) Resp:  [16-22] 22 (05/27 1530) BP: (94-118)/(63-71) 118/71 (05/27 1530) SpO2:  [92 %-100 %] 97 % (05/27 1530) Weight:  [77.5 kg] 77.5 kg (05/27 0423)  Weight change: 0.816 kg Filed Weights   10/27/18 0326 10/28/18 0510 10/29/18 0423  Weight: 77.1 kg 76.7 kg 77.5 kg    Intake/Output: I/O last 3 completed shifts: In: 297.9 [I.V.:297.9] Out: 2400 [Urine:2400]   Intake/Output this shift:  Total I/O In: -  Out: 915 [Urine:915]  Physical Exam: General: NAD, sitting in chair  Head: Normocephalic, atraumatic. Moist oral mucosal membranes  Eyes: Anicteric, PERRL  Neck: Supple, trachea midline  Lungs:  Clear to auscultation  Heart: Regular rate and rhythm  Abdomen:  Soft, nontender,   Extremities:  1+ peripheral edema.  Neurologic: Nonfocal, moving all four extremities  Skin: No lesions        Basic Metabolic Panel: Recent Labs  Lab 10/27/18 0352 10/27/18 0639 10/28/18 0014 10/29/18 0501  NA 143  --  143 140  K 4.2  --  4.5 4.0  CL 115*  --  116* 113*  CO2 16*  --  18* 19*  GLUCOSE 107*  --  80 86  BUN 58*  --  59* 64*  CREATININE 3.55* 3.45* 3.35* 3.52*  CALCIUM 10.0  --  9.4 9.2  MG  --   --  1.8  --     Liver Function Tests: Recent Labs  Lab 10/27/18 0352  AST 34  ALT 23  ALKPHOS 92  BILITOT 1.7*  PROT 6.7  ALBUMIN 3.7   No results for input(s): LIPASE, AMYLASE in the last 168 hours. No results for input(s): AMMONIA in the last 168 hours.  CBC: Recent Labs  Lab 10/27/18 0352 10/27/18 0639 10/28/18 0014 10/29/18 0501  WBC 6.0 4.4 3.5* 3.5*  NEUTROABS 4.9  --   --   --   HGB 13.8 13.4 12.4* 11.4*  HCT 42.9 40.5 37.2* 33.5*  MCV 89.4 89.2 86.7 85.0  PLT 140* 134* 123* 122*    Cardiac  Enzymes: Recent Labs  Lab 10/27/18 0352 10/27/18 0639 10/27/18 1039 10/27/18 1732  TROPONINI 0.30* 0.33* 0.37* 0.33*    BNP: Invalid input(s): POCBNP  CBG: No results for input(s): GLUCAP in the last 168 hours.  Microbiology: Results for orders placed or performed during the hospital encounter of 10/27/18  SARS Coronavirus 2 (CEPHEID - Performed in [place] hospital lab), Hosp Order     Status: None   Collection Time: 10/27/18  3:52 AM  Result Value Ref Range Status   SARS Coronavirus 2 NEGATIVE NEGATIVE Final    Comment: (NOTE) If result is NEGATIVE SARS-CoV-2 target nucleic acids are NOT DETECTED. The SARS-CoV-2 RNA is generally detectable in upper and lower  respiratory specimens during the acute phase of infection. The lowest  concentration of SARS-CoV-2 viral copies this assay can detect is 250  copies / mL. A negative result does not preclude SARS-CoV-2 infection  and should not be used as the sole basis for treatment or other  patient management decisions.  A negative result may occur with  improper specimen collection / handling, submission of specimen other  than nasopharyngeal swab, presence of viral mutation(s) within  the  areas targeted by this assay, and inadequate number of viral copies  (<250 copies / mL). A negative result must be combined with clinical  observations, patient history, and epidemiological information. If result is POSITIVE SARS-CoV-2 target nucleic acids are DETECTED. The SARS-CoV-2 RNA is generally detectable in upper and lower  respiratory specimens dur ing the acute phase of infection.  Positive  results are indicative of active infection with SARS-CoV-2.  Clinical  correlation with patient history and other diagnostic information is  necessary to determine patient infection status.  Positive results do  not rule out bacterial infection or co-infection with other viruses. If result is PRESUMPTIVE POSTIVE SARS-CoV-2 nucleic acids MAY  BE PRESENT.   A presumptive positive result was obtained on the submitted specimen  and confirmed on repeat testing.  While 2019 novel coronavirus  (SARS-CoV-2) nucleic acids may be present in the submitted sample  additional confirmatory testing may be necessary for epidemiological  and / or clinical management purposes  to differentiate between  SARS-CoV-2 and other Sarbecovirus currently known to infect humans.  If clinically indicated additional testing with an alternate test  methodology [phone]) is advised. The SARS-CoV-2 RNA is generally  detectable in upper and lower respiratory sp ecimens during the acute  phase of infection. The expected result is Negative. Fact Sheet for Patients:  StrictlyIdeas.no Fact Sheet for Healthcare Providers: BankingDealers.co.za This test is not yet approved or cleared by the [place] FDA and has been authorized for detection and/or diagnosis of SARS-CoV-2 by FDA under an Emergency Use Authorization (EUA).  This EUA will remain in effect (meaning this test can be used) for the duration of the COVID-19 declaration under Section 564(b)(1) of the Act, 21 U.S.C. section 360bbb-3(b)(1), unless the authorization is terminated or revoked sooner. Performed at [facility], [address] [place], [place] [address]     Coagulation Studies: Recent Labs    10/27/18 8299  LABPROT 20.4*  INR 1.8*    Urinalysis: Recent Labs    10/28/18 1923  COLORURINE YELLOW*  LABSPEC 1.006  PHURINE 5.0  GLUCOSEU NEGATIVE  HGBUR SMALL*  BILIRUBINUR NEGATIVE  KETONESUR NEGATIVE  PROTEINUR NEGATIVE  NITRITE NEGATIVE  LEUKOCYTESUR NEGATIVE      Imaging: Dg Chest Port 1 View  Result Date: 10/28/2018 CLINICAL DATA:  Acute CHF EXAM: PORTABLE CHEST 1 VIEW COMPARISON:  Yesterday FINDINGS: Cardiomegaly and aortic tortuosity, stable. Interstitial coarsening without definite Kerley lines. No pleural  effusion. No air bronchogram or pneumothorax. IMPRESSION: History of CHF with stable interstitial opacity. Suspect at least some of this opacity is from chronic lung disease. Electronically Signed   By: Monte Fantasia M.D.   On: 10/28/2018 04:32     Medications:   . sodium chloride     . aspirin EC  81 mg Oral Daily  . atorvastatin  40 mg Oral q1800  . carvedilol  3.125 mg Oral Daily  . enoxaparin (LOVENOX) injection  30 mg Subcutaneous Q24H  . ferrous sulfate  325 mg Oral Daily  . folic acid  1 mg Oral Daily  . furosemide  80 mg Intravenous BID  . indapamide  2.5 mg Oral Daily  . isosorbide-hydrALAZINE  1 tablet Oral TID  . levothyroxine  25 mcg Oral QAC breakfast  . multivitamin with minerals  1 tablet Oral Daily  . pantoprazole  40 mg Oral Daily  . sodium chloride flush  3 mL Intravenous Q12H   sodium chloride, acetaminophen, ondansetron (ZOFRAN) IV, sodium chloride flush  Assessment/ Plan:  Alex Larson is a 85 y.o. black male with congestive heart failure, coronary artery disease, peripheral vascular disease, anemiawho was admitted to [facility]7225 for Acute systolic congestive heart failure  1. Acute renal failure: with chronic kidney disease stage NOS. Unknown baseline creatinine. No proteinuria, urinalysis with hematuria.  Acute renal failure secondary to acute cardiorenal syndrome Nonoliguric urine output  2. Acute exacerbation of systolic congestive heart failure  3. Hypertension  Plan Continue diuretics: indapamide and furosemide.  Request records from Nephrologist in [place].  - Continue carvedilol.    LOS: 2 Amyria Komar 5/27/20203:41 PM

## 2018-10-29 NOTE — Progress Notes (Signed)
Patient's daughter [name] updated over the phone. She gave this RN contact information on patient's nephrologist in [place]. This RN relayed this info to Dr. Saunders Revel and Dr. Juleen China. Info also placed in Treatment Team Sticky Note.

## 2018-10-29 NOTE — Progress Notes (Signed)
Ch f/u w/ pt regarding the completion of AD. Pt shared that he was educated by a previous ch on how to designate a HPOA and just needed it to be notarized. Ch verified the info with the daughter [name]) present via telephone. Pt shared that his life is no full circle as he has traveled domestically and internationally and lived a full life. Pt still grieved the loss of his wife and being away from church family/friends in [place]. Ch provided a compassionate presence and understood the longing for what was. Pt shared that he was grateful but was not prepared to lose his wife (69) at such a young age on 5.26.19. Ch allowed time for pt to lament and share about his recent health challenges. Ch completed a Goals of Care plan w/ pt and left his AD at bedside.    10/29/18 0905  Clinical Encounter Type  Visited With Health care provider;Patient and family together  Visit Type Follow-up;Psychological support;Spiritual support;Social support;Other (Comment) (AD completion )  Referral From Chaplain  Consult/Referral To Chaplain  Spiritual Encounters  Spiritual Needs Emotional;Grief support;Sacred text  Stress Factors  Patient Stress Factors Health changes;Major life changes  Family Stress Factors None identified  Advance Directives (For Healthcare)  Does Patient Have a Medical Advance Directive? Yes  Does patient want to make changes to medical advance directive? No - Patient declined  Type of Advance Directive [place] in Chart? Yes - validated most recent copy scanned in chart (See row information)

## 2018-10-30 DIAGNOSIS — Z515 Encounter for palliative care: Secondary | ICD-10-CM

## 2018-10-30 DIAGNOSIS — Z7189 Other specified counseling: Secondary | ICD-10-CM

## 2018-10-30 LAB — BASIC METABOLIC PANEL
Anion gap: 9 (ref 5–15)
BUN: 70 mg/dL — ABNORMAL HIGH (ref 8–23)
CO2: 19 mmol/L — ABNORMAL LOW (ref 22–32)
Calcium: 9.5 mg/dL (ref 8.9–10.3)
Chloride: 112 mmol/L — ABNORMAL HIGH (ref 98–111)
Creatinine, Ser: 3.61 mg/dL — ABNORMAL HIGH (ref 0.61–1.24)
GFR calc Af Amer: 16 mL/min — ABNORMAL LOW (ref >60)
GFR calc non Af Amer: 14 mL/min — ABNORMAL LOW (ref >60)
Glucose, Bld: 101 mg/dL — ABNORMAL HIGH (ref 70–99)
Potassium: 3.8 mmol/L (ref 3.5–5.1)
Sodium: 140 mmol/L (ref 135–145)

## 2018-10-30 LAB — KAPPA/LAMBDA LIGHT CHAINS
Kappa free light chain: 102.8 mg/L — ABNORMAL HIGH (ref 3.3–19.4)
Kappa, lambda light chain ratio: 2.24 — ABNORMAL HIGH (ref 0.26–1.65)
Lambda free light chains: 45.9 mg/L — ABNORMAL HIGH (ref 5.7–26.3)

## 2018-10-30 LAB — PROTEIN ELECTROPHORESIS, SERUM
A/G Ratio: 1.3 (ref 0.7–1.7)
Albumin ELP: 3.1 g/dL (ref 2.9–4.4)
Alpha-1-Globulin: 0.3 g/dL (ref 0.0–0.4)
Alpha-2-Globulin: 0.4 g/dL (ref 0.4–1.0)
Beta Globulin: 0.8 g/dL (ref 0.7–1.3)
Gamma Globulin: 0.8 g/dL (ref 0.4–1.8)
Globulin, Total: 2.3 g/dL (ref 2.2–3.9)
Total Protein ELP: 5.4 g/dL — ABNORMAL LOW (ref 6.0–8.5)

## 2018-10-30 LAB — HEPATITIS B SURFACE ANTIGEN: Hepatitis B Surface Ag: NEGATIVE

## 2018-10-30 LAB — HEPATITIS B CORE ANTIBODY, IGM: Hep B C IgM: NEGATIVE

## 2018-10-30 LAB — HEPATITIS C ANTIBODY: HCV Ab: <0.1 s/co ratio (ref 0.0–0.9)

## 2018-10-30 LAB — HEPATITIS B SURFACE ANTIBODY,QUALITATIVE: Hep B S Ab: NONREACTIVE

## 2018-10-30 MED ORDER — ENSURE ENLIVE PO LIQD
237.0000 mL | Freq: Two times a day (BID) | ORAL | Status: DC
  Administered 2018-10-30 – 2018-11-02 (×6): 237 mL via ORAL

## 2018-10-30 MED ORDER — FUROSEMIDE 10 MG/ML IJ SOLN
120.0000 mg | Freq: Two times a day (BID) | INTRAVENOUS | Status: DC
  Administered 2018-10-30 – 2018-11-01 (×5): 120 mg via INTRAVENOUS
  Filled 2018-10-30 (×8): qty 12

## 2018-10-30 NOTE — Progress Notes (Signed)
Cardiac Rehab Navigator/ Exercise Physiologist Note  CHF Education:?? Educational session with patient completed.  This EP provided patient with "Living Better with Heart Failure" packet. This EP briefly reviewed definition of heart failure and signs and symptoms of an exacerbation. This EP discussed potential causes of CHF.  Explained to patient that HF is a chronic illness which requires self-assessment / self-management along with help from the cardiologist/PCP. This EP discussed definition of EF measurement along with normal value compared to patient's EF 10-15%. ? *Reviewed importance of and reason behind checking weight daily in the AM, after using the bathroom, but before getting dressed. Patient recently moved from [place] and into daughter's home. Patient will make sure to find scale and begin weighing daily and recording on calendars provided.  *Reviewed with patient the following information: *Discussed when to call the Dr= weight gain of >2-3lb overnight of 5lb in a week,  *Discussed yellow zone= call MD: weight gain of >2-3lb overnight of 5lb in a week, increased swelling, increased SOB when lying down, chest discomfort, dizziness, increased fatigue *Red Zone= call 911: struggle to breath, fainting or near fainting, significant chest pain ?  *Heart Failure Zone Magnet given and reviewed with patient.   ? *Diet - Patient currently ordered Heart Healthy.  Referral for Dietitian Consultation for diet education has been ordered. Instructed patient to follow a low sodium diet of 2000 mg or less.  Recommended foods for low sodium heart healthy nutrition nutrition therapy discussed. Reviewed with patient steps to reading a food label with close attention to serving size and mg of sodium.   ? *Discussed fluid intake with patient as well. Patient not currently on a fluid restriction, but advised no more than 64 ounces of fluid per day.  Demonstrated this volume to patient using the bedside  water pitcher.   ? *Instructed patient to take medications as prescribed for heart failure. Patient did not take medications for several weeks after moving to [place]. Patient voices understanding of how important it is to take his medications and has access to them now.  *Discussed exercise / activity. Patient reports that he has not been active since moving to [place]. Patient uses a cane when walking. Patient does enjoy walking and being active. Patient is interested in Cardiac Rehab but states that is his daughter's decision and he will discuss with her. This EP encouraged patient to be as active as possible. This EP gave patient program brochure and informational sheet. ? *Smoking Cessation- Patient is a FORMER smoker.  ? *[facility] - Explained the purpose of the HF Clinic. This EP explained to patient the HF Clinic does not replace PCP nor Cardiologist, but is an additional resource to helping patient manage heart failure at home. Patient does not wish to be followed in the [facility]. Patient said his daughter would have to make this decision as well. The HF Clinic brochure was given to patient in take home materials. Patient will discuss with daughter and call for an appointment if they would like to do so.   Again, the 5 Steps to Living Better with Heart Failure were reviewed with patient.   Patient thanked me for providing the above information. ?

## 2018-10-30 NOTE — Plan of Care (Signed)
  Problem: Cardiac: Goal: Ability to achieve and maintain adequate cardiopulmonary perfusion will improve Outcome: Progressing   Problem: Clinical Measurements: Goal: Ability to maintain clinical measurements within normal limits will improve Outcome: Progressing Goal: Respiratory complications will improve Outcome: Progressing   Problem: Activity: Goal: Risk for activity intolerance will decrease Outcome: Progressing   Problem: Safety: Goal: Ability to remain free from injury will improve Outcome: Progressing

## 2018-10-30 NOTE — Progress Notes (Signed)
[facility]  ROUNDING NOTE   Subjective:   UOP 1591mL  Furosemide bolus increased to 120mg  IV bid  Objective:  Vital signs in last 24 hours:  Temp:  [97.5 F (36.4 C)-98.3 F (36.8 C)] 97.7 F (36.5 C) (05/28 0753) Pulse Rate:  [50-52] 51 (05/28 0753) Resp:  [16-22] 16 (05/28 0753) BP: (116-127)/(62-71) 127/62 (05/28 0753) SpO2:  [97 %-100 %] 98 % (05/28 0753) Weight:  [76.7 kg] 76.7 kg (05/28 0406)  Weight change: -0.816 kg Filed Weights   10/28/18 0510 10/29/18 0423 10/30/18 0406  Weight: 76.7 kg 77.5 kg 76.7 kg    Intake/Output: I/O last 3 completed shifts: In: -  Out: 2290 [Urine:2290]   Intake/Output this shift:  Total I/O In: 240 [P.O.:240] Out: 300 [Urine:300]  Physical Exam: General: NAD, laying in bed  Head: Normocephalic, atraumatic. Moist oral mucosal membranes  Eyes: Anicteric, PERRL  Neck: Supple, trachea midline  Lungs:  Clear to auscultation  Heart: Regular rate and rhythm  Abdomen:  Soft, nontender,   Extremities:  1+ peripheral edema.  Neurologic: Nonfocal, moving all four extremities  Skin: No lesions        Basic Metabolic Panel: Recent Labs  Lab 10/27/18 0352 10/27/18 0639 10/28/18 0014 10/29/18 0501 10/30/18 0319  NA 143  --  143 140 140  K 4.2  --  4.5 4.0 3.8  CL 115*  --  116* 113* 112*  CO2 16*  --  18* 19* 19*  GLUCOSE 107*  --  80 86 101*  BUN 58*  --  59* 64* 70*  CREATININE 3.55* 3.45* 3.35* 3.52* 3.61*  CALCIUM 10.0  --  9.4 9.2 9.5  MG  --   --  1.8  --   --     Liver Function Tests: Recent Labs  Lab 10/27/18 0352  AST 34  ALT 23  ALKPHOS 92  BILITOT 1.7*  PROT 6.7  ALBUMIN 3.7   No results for input(s): LIPASE, AMYLASE in the last 168 hours. No results for input(s): AMMONIA in the last 168 hours.  CBC: Recent Labs  Lab 10/27/18 0352 10/27/18 0639 10/28/18 0014 10/29/18 0501  WBC 6.0 4.4 3.5* 3.5*  NEUTROABS 4.9  --   --   --   HGB 13.8 13.4 12.4* 11.4*  HCT 42.9 40.5 37.2* 33.5*   MCV 89.4 89.2 86.7 85.0  PLT 140* 134* 123* 122*    Cardiac Enzymes: Recent Labs  Lab 10/27/18 0352 10/27/18 0639 10/27/18 1039 10/27/18 1732  TROPONINI 0.30* 0.33* 0.37* 0.33*    BNP: Invalid input(s): POCBNP  CBG: No results for input(s): GLUCAP in the last 168 hours.  Microbiology: Results for orders placed or performed during the hospital encounter of 10/27/18  SARS Coronavirus 2 (CEPHEID - Performed in [place] hospital lab), Hosp Order     Status: None   Collection Time: 10/27/18  3:52 AM  Result Value Ref Range Status   SARS Coronavirus 2 NEGATIVE NEGATIVE Final    Comment: (NOTE) If result is NEGATIVE SARS-CoV-2 target nucleic acids are NOT DETECTED. The SARS-CoV-2 RNA is generally detectable in upper and lower  respiratory specimens during the acute phase of infection. The lowest  concentration of SARS-CoV-2 viral copies this assay can detect is 250  copies / mL. A negative result does not preclude SARS-CoV-2 infection  and should not be used as the sole basis for treatment or other  patient management decisions.  A negative result may occur with  improper specimen collection /  handling, submission of specimen other  than nasopharyngeal swab, presence of viral mutation(s) within the  areas targeted by this assay, and inadequate number of viral copies  (<250 copies / mL). A negative result must be combined with clinical  observations, patient history, and epidemiological information. If result is POSITIVE SARS-CoV-2 target nucleic acids are DETECTED. The SARS-CoV-2 RNA is generally detectable in upper and lower  respiratory specimens dur ing the acute phase of infection.  Positive  results are indicative of active infection with SARS-CoV-2.  Clinical  correlation with patient history and other diagnostic information is  necessary to determine patient infection status.  Positive results do  not rule out bacterial infection or co-infection with other  viruses. If result is PRESUMPTIVE POSTIVE SARS-CoV-2 nucleic acids MAY BE PRESENT.   A presumptive positive result was obtained on the submitted specimen  and confirmed on repeat testing.  While 2019 novel coronavirus  (SARS-CoV-2) nucleic acids may be present in the submitted sample  additional confirmatory testing may be necessary for epidemiological  and / or clinical management purposes  to differentiate between  SARS-CoV-2 and other Sarbecovirus currently known to infect humans.  If clinically indicated additional testing with an alternate test  methodology [phone]) is advised. The SARS-CoV-2 RNA is generally  detectable in upper and lower respiratory sp ecimens during the acute  phase of infection. The expected result is Negative. Fact Sheet for Patients:  StrictlyIdeas.no Fact Sheet for Healthcare Providers: BankingDealers.co.za This test is not yet approved or cleared by the [place] FDA and has been authorized for detection and/or diagnosis of SARS-CoV-2 by FDA under an Emergency Use Authorization (EUA).  This EUA will remain in effect (meaning this test can be used) for the duration of the COVID-19 declaration under Section 564(b)(1) of the Act, 21 U.S.C. section 360bbb-3(b)(1), unless the authorization is terminated or revoked sooner. Performed at [facility], [address] [place], [place] [address]     Coagulation Studies: No results for input(s): LABPROT, INR in the last 72 hours.  Urinalysis: Recent Labs    10/28/18 1923  COLORURINE YELLOW*  LABSPEC 1.006  PHURINE 5.0  GLUCOSEU NEGATIVE  HGBUR SMALL*  BILIRUBINUR NEGATIVE  KETONESUR NEGATIVE  PROTEINUR NEGATIVE  NITRITE NEGATIVE  LEUKOCYTESUR NEGATIVE      Imaging: No results found.   Medications:   . sodium chloride    . furosemide     . aspirin EC  81 mg Oral Daily  . atorvastatin  40 mg Oral q1800  . carvedilol  3.125 mg  Oral Daily  . enoxaparin (LOVENOX) injection  30 mg Subcutaneous Q24H  . ferrous sulfate  325 mg Oral Daily  . folic acid  1 mg Oral Daily  . indapamide  2.5 mg Oral Daily  . isosorbide-hydrALAZINE  1 tablet Oral TID  . levothyroxine  25 mcg Oral QAC breakfast  . multivitamin with minerals  1 tablet Oral Daily  . pantoprazole  40 mg Oral Daily  . sodium chloride flush  3 mL Intravenous Q12H   sodium chloride, acetaminophen, ondansetron (ZOFRAN) IV, sodium chloride flush  Assessment/ Plan:  Mr. Alex Larson is a 85 y.o. black male with congestive heart failure, coronary artery disease, peripheral vascular disease, anemiawho was [facility] for Acute systolic congestive heart failure  1. Acute renal failure: with chronic kidney disease stage IV. Baseline creatinine is 3.7, GFR of 19 on 09/11/2018 No proteinuria, urinalysis with hematuria.  Acute renal failure secondary to acute cardiorenal  syndrome Nonoliguric urine output  2. Acute exacerbation of systolic congestive heart failure  3. Hypertension  Plan Continue diuretics: indapamide and furosemide. Increased dose to 120mg  bid.  No indication for dialysis.    LOS: 3 Raphaela Cannaday 5/28/202010:39 AM

## 2018-10-30 NOTE — Progress Notes (Signed)
Progress Note  Patient Name: Alex Larson Date of Encounter: 10/30/2018  Primary Cardiologist: Nelva Bush, MD  Subjective   No c/p or sob @ rest.  Got up to chair some yesterday w/o difficulty.  No orthopnea last night, slept well.  He has been experiencing intermittent right groin comfort over the past 24 hours.  No change in bowel habits.  Inpatient Medications    Scheduled Meds: . aspirin EC  81 mg Oral Daily  . atorvastatin  40 mg Oral q1800  . carvedilol  3.125 mg Oral Daily  . enoxaparin (LOVENOX) injection  30 mg Subcutaneous Q24H  . ferrous sulfate  325 mg Oral Daily  . folic acid  1 mg Oral Daily  . indapamide  2.5 mg Oral Daily  . isosorbide-hydrALAZINE  1 tablet Oral TID  . levothyroxine  25 mcg Oral QAC breakfast  . multivitamin with minerals  1 tablet Oral Daily  . pantoprazole  40 mg Oral Daily  . sodium chloride flush  3 mL Intravenous Q12H   Continuous Infusions: . sodium chloride    . furosemide     PRN Meds: sodium chloride, acetaminophen, ondansetron (ZOFRAN) IV, sodium chloride flush   Vital Signs    Vitals:   10/29/18 2022 10/30/18 0405 10/30/18 0406 10/30/18 0753  BP: 123/68 116/65  127/62  Pulse: (!) 50 (!) 51  (!) 51  Resp: 20   16  Temp: 98.3 F (36.8 C) (!) 97.5 F (36.4 C)  97.7 F (36.5 C)  TempSrc:    Oral  SpO2: 100% 97%  98%  Weight:   76.7 kg   Height:        Intake/Output Summary (Last 24 hours) at 10/30/2018 1026 Last data filed at 10/30/2018 0900 Gross per 24 hour  Intake 240 ml  Output 1675 ml  Net -1435 ml   Filed Weights   10/28/18 0510 10/29/18 0423 10/30/18 0406  Weight: 76.7 kg 77.5 kg 76.7 kg    Physical Exam   GEN: Well nourished, well developed, in no acute distress.  HEENT: Grossly normal.  Neck: Supple, JVD ~ 10-12, no carotid bruits, or masses. Cardiac: RRR, brady, 2/6 holosyst murmur throughout.  No rubs, or gallops. No clubbing, cyanosis, edema.  Radials/DP/PT 2+ and equal bilaterally.   Respiratory:  Respirations regular and unlabored, Mildly diminished at bases bilaterally.  GI: Soft, nontender, nondistended, BS + x 4.  Lower abdomen right groin is without mass or protrusion. MS: no deformity or atrophy. Skin: warm and dry, no rash. Neuro:  Strength and sensation are intact. Psych: AAOx3.  Normal affect.  Labs    Chemistry Recent Labs  Lab 10/27/18 0352  10/28/18 0014 10/29/18 0501 10/30/18 0319  NA 143  --  143 140 140  K 4.2  --  4.5 4.0 3.8  CL 115*  --  116* 113* 112*  CO2 16*  --  18* 19* 19*  GLUCOSE 107*  --  80 86 101*  BUN 58*  --  59* 64* 70*  CREATININE 3.55*   < > 3.35* 3.52* 3.61*  CALCIUM 10.0  --  9.4 9.2 9.5  PROT 6.7  --   --   --   --   ALBUMIN 3.7  --   --   --   --   AST 34  --   --   --   --   ALT 23  --   --   --   --   ALKPHOS 92  --   --   --   --  BILITOT 1.7*  --   --   --   --   GFRNONAA 14*   < > 15* 14* 14*  GFRAA 17*   < > 18* 17* 16*  ANIONGAP 12  --  9 8 9    < > = values in this interval not displayed.     Hematology Recent Labs  Lab 10/27/18 0639 10/28/18 0014 10/29/18 0501  WBC 4.4 3.5* 3.5*  RBC 4.54 4.29 3.94*  HGB 13.4 12.4* 11.4*  HCT 40.5 37.2* 33.5*  MCV 89.2 86.7 85.0  MCH 29.5 28.9 28.9  MCHC 33.1 33.3 34.0  RDW 20.5* 20.2* 19.7*  PLT 134* 123* 122*    Cardiac Enzymes Recent Labs  Lab 10/27/18 0352 10/27/18 0639 10/27/18 1039 10/27/18 1732  TROPONINI 0.30* 0.33* 0.37* 0.33*     BNP Recent Labs  Lab 10/27/18 0352 10/27/18 0639  BNP >4,500.0* >4,500.0*    Lab Results  Component Value Date   TSH 117.652 (H) 10/28/2018     Radiology    No results found.  Telemetry    Sinus bradycardia, 50s to 60s.- Personally Reviewed  Cardiac Studies   Echocardiogram (10/28/2018): 1. The left ventricle has severely reduced systolic function, with an ejection fraction of 10-15%. The cavity size was normal. There is moderately increased left ventricular wall thickness. Left ventricular  diastolic Doppler parameters are consistent  with impaired relaxation. Left ventricular diffuse hypokinesis. 2. The right ventricle has severely reduced systolic function. The cavity was moderately enlarged. There is no increase in right ventricular wall thickness. Right ventricular systolic pressure is severely elevated with an estimated pressure of 70.4  mmHg. 3. Left atrial size was mildly dilated. 4. Right atrial size was moderately dilated. 5. Trivial pericardial effusion is present. 6. The mitral valve is degenerative. Mild thickening of the mitral valve leaflet. 7. Tricuspid valve regurgitation is moderate-severe. 8. The aortic valve is tricuspid. Severely thickening of the aortic valve. Sclerosis without any evidence of stenosis of the aortic valve. 9. The aortic root is normal in size and structure. 10. The inferior vena cava was dilated in size with <50% respiratory variability.  Patient Profile     85 y.o. male with history of CAD status post PCI x2 (details unknown), chronic HFrEF, CKD (uncertain baseline creatinine), PAD status post right lower extremity bypass, chronic respiratory failure with hypoxia on supplemental oxygen, and hypertension, admitted with acute on chronic systolic and diastolic heart failure.  Assessment & Plan    1.  Acute on chronic combined systolic and diast CHF/ICM:  EF 10-15% by echo this admission.  He reports that breathing has been stable over the past 24 hours and may be slightly improved.  He remains on 3 L of oxygen via nasal cannula (home dose liters).  He did tolerate getting up to the chair some yesterday.  Minus 1.5L overnight and 3.8L since admission.  Wt down 0.8 kg since yesterday.  He remains volume overloaded with JVD and lower extremity edema.  BUN/Creat bumping slightly - 70/3.61 (prior baseline 3.7).  Bicarb has been stable.  Will advance diuresis-Lasix 120 mg.  Continue indapamide.  Continue low-dose beta-blocker and BiDil.   ACE/ARB/ARNI/MRA secondary to renal failure.  With significant LV dysfunction, low output, chronic kidney disease with cardiorenal syndrome, and advanced age, I think we should involve palliative care.  2.  Acute on chronic respiratory failure w/ hypoxia: He has been stable, now on 3 L of oxygen.  He is on 2 L at home.  Volume  overloaded.  Continue diuresis as above.  3.  Hypothyroidism:  TSH 117.6 this admission.  On levothyroxine.  Difficult to know the extent to which edema/volume may be reflective of myxedema.  Will likely need careful outpatient titration of levothyroxine dosing.  4.  CAD/elevated troponin:  S/p prior PCI x 2 in AR.  No chest pain.  Mild trop elevation w/ flat trend: 0.30  0.33  0.37  0.33.  Continue aspirin, statin, and low-dose beta-blocker.  No plans for ischemic evaluation given comorbidities and age.  5.  Stage IV-V chronic kidney disease: BUN and creatinine slowly rising to what appears to be prior baseline.  Bicarb has been stable.  Continuing diuresis as above.  Following avoid nephrotoxic agents.  6.  Thrombocytopenia: Mild and stable with uncertain chronicity.  Signed, Murray Hodgkins, NP  10/30/2018, 10:26 AM    For questions or updates, please contact   Please consult [email or web address] for contact info under Cardiology/STEMI.

## 2018-10-30 NOTE — Consult Note (Addendum)
Consultation Note Date: 10/30/2018   Patient Name: Alex Larson  DOB: April 24, 1927  MRN: [id number]  Age / Sex: 85 y.o., male  PCP: Patient, No Pcp Per Referring Physician: Bettey Costa, MD  Reason for Consultation: Establishing goals of care  HPI/Patient Profile: Alex Larson  is a 85 y.o. male with a known history of CHF, coronary artery disease with stent placement x2, peripheral vascular disease with a history of right lower extremity bypass, and hypertension.  He tells me he recently relocated to the area living with his daughter currently.  His last residence was in [place] and he has not yet established primary care in the area.  He reports being out of his Lasix for 4 months.  Clinical Assessment and Goals of Care: Patient is resting in bed. He was widowed 10/27/17. He has 3 children, one of which he would like as a HPOA. The papers are completed, but needing notarization. 2 of his children are pastors.   He states he moved here from [place] to be near his daughter after his wife died. He states he has lived all over the country, but was born in [place]. He states he ran out of Lasix when he moved here. Functionally, de denies issue with oral intake, and states he uses a cane. He uses 2 lpm of O2 at home.   We discussed his diagnosis, prognosis, and GOC.  A detailed discussion was had today regarding advanced directives.  Concepts specific to code status, artifical feeding and hydration, IV antibiotics and rehospitalization were discussed.  The difference between an aggressive medical intervention path and a comfort care path was discussed.  Values and goals of care important to patient and family were attempted to be elicited.  Discussed limitations of medical interventions to prolong quality of life in some situations and discussed the concept of human mortality.  He states he has considered a living  will, but wanted to discuss it with his daughter. He states he realizes his time is not long. He did not expect to outlive his wife as he was about 46 years older than her. He states "I am still here, and if God wants me to live a little longer, I'm  okay with that; if he wants to take me, I'm okay with that too. I've already made funeral arrangements."   He states his wife was a Marine scientist, and had decided she did not want any life prolonging measures, but when he was called to the hospital, she had already been placed on a ventilator. He states care was withdrawn after 5 days. He states he would never want a ventilator because he likes to be aware of what is going on. He states he would not want chest compressions, shocks, or a ventilator with cardiopulmonary resuscitation. He would like to continue to treat the treatable.     I completed a MOST form today and the signed original was placed in the chart. A photocopy was placed in the chart to be scanned into EMR. The patient outlined  their wishes for the following treatment decisions:  Cardiopulmonary Resuscitation: Do Not Attempt Resuscitation (DNR/No CPR)  Medical Interventions: Limited Additional Interventions: Use medical treatment, IV fluids and cardiac monitoring as indicated, DO NOT USE intubation or mechanical ventilation. May consider use of less invasive airway support such as BiPAP or CPAP. Also provide comfort measures. Transfer to the hospital if indicated. Avoid intensive care.   Antibiotics: Antibiotics if indicated  IV Fluids: IV fluids if indicated  Feeding Tube: Feeding tube for a defined trial period    SUMMARY OF RECOMMENDATIONS    DNR/DNI Recommend palliative to follow at D/C for continued [place] conversations.    Code Status/Advance Care Planning:  DNR   Prognosis:   Poor overall   Discharge Planning: To Be Determined      Primary Diagnoses: Present on Admission: **None**   I have reviewed the medical record,  interviewed the patient and family, and examined the patient. The following aspects are pertinent.  Past Medical History:  Diagnosis Date  . CAD (coronary artery disease)   . CKD (chronic kidney disease)   . PAD (peripheral artery disease) ([name])    Social History   Socioeconomic History  . Marital status: Widowed    Spouse name: Not on file  . Number of children: Not on file  . Years of education: Not on file  . Highest education level: Not on file  Occupational History  . Not on file  Social Needs  . Financial resource strain: Not on file  . Food insecurity:    Worry: Not on file    Inability: Not on file  . Transportation needs:    Medical: Not on file    Non-medical: Not on file  Tobacco Use  . Smoking status: Unknown If Ever Smoked  . Smokeless tobacco: Former Network engineer and Sexual Activity  . Alcohol use: Not Currently  . Drug use: Never  . Sexual activity: Not on file  Lifestyle  . Physical activity:    Days per week: Not on file    Minutes per session: Not on file  . Stress: Not on file  Relationships  . Social connections:    Talks on phone: Not on file    Gets together: Not on file    Attends religious service: Not on file    Active member of club or organization: Not on file    Attends meetings of clubs or organizations: Not on file    Relationship status: Not on file  Other Topics Concern  . Not on file  Social History Narrative  . Not on file   History reviewed. No pertinent family history. Scheduled Meds: . aspirin EC  81 mg Oral Daily  . atorvastatin  40 mg Oral q1800  . carvedilol  3.125 mg Oral Daily  . enoxaparin (LOVENOX) injection  30 mg Subcutaneous Q24H  . feeding supplement (ENSURE ENLIVE)  237 mL Oral BID BM  . ferrous sulfate  325 mg Oral Daily  . folic acid  1 mg Oral Daily  . indapamide  2.5 mg Oral Daily  . isosorbide-hydrALAZINE  1 tablet Oral TID  . levothyroxine  25 mcg Oral QAC breakfast  . multivitamin with minerals   1 tablet Oral Daily  . pantoprazole  40 mg Oral Daily  . sodium chloride flush  3 mL Intravenous Q12H   Continuous Infusions: . sodium chloride    . furosemide 120 mg (10/30/18 1040)   PRN Meds:.sodium chloride, acetaminophen, ondansetron (ZOFRAN) IV, sodium  chloride flush Medications Prior to Admission:  Prior to Admission medications   Medication Sig Start Date End Date Taking? Authorizing Provider  amLODipine (NORVASC) 5 MG tablet Take 5 mg by mouth daily.   Yes [provider]  carvedilol (COREG) 6.25 MG tablet Take 6.25 mg by mouth daily.    Yes [provider]  ferrous sulfate 220 (44 Fe) MG/5ML solution Take 220 mg by mouth daily.   Yes [provider]  furosemide (LASIX) 40 MG tablet Take 40 mg by mouth 2 (two) times daily.   Yes [provider]  indapamide (LOZOL) 2.5 MG tablet Take 2.5 mg by mouth daily. 30 minutes prior to furosemide   Yes [provider]  isosorbide mononitrate (ISMO) 10 MG tablet Take 10 mg by mouth daily.   Yes [provider]  multivitamin-iron-minerals-folic acid (CENTRUM) chewable tablet Chew 1 tablet by mouth daily.   Yes [provider]  pantoprazole (PROTONIX) 40 MG tablet Take 40 mg by mouth daily.   Yes [provider]   No Known Allergies Review of Systems  Cardiovascular:       Edema    Physical Exam Pulmonary:     Effort: Pulmonary effort is normal.  Skin:    General: Skin is warm and dry.  Neurological:     Mental Status: He is alert.     Vital Signs: BP 127/62 (BP Location: Left Arm)   Pulse 69   Temp 97.7 F (36.5 C) (Oral)   Resp 16   Ht 6\' 2"  (1.88 m)   Wt 76.7 kg   SpO2 98%   BMI 21.70 kg/m  Pain Scale: 0-10   Pain Score: 0-No pain   SpO2: SpO2: 98 % O2 Device:SpO2: 98 % O2 Flow Rate: .O2 Flow Rate (L/min): 2.5 L/min  IO: Intake/output summary:   Intake/Output Summary (Last 24 hours) at 10/30/2018 1429 Last data filed at 10/30/2018 1412  Gross per 24 hour  Intake 240 ml  Output 2145 ml  Net -1905 ml    LBM: Last BM Date: 10/29/18 Baseline Weight: Weight: 77.1 kg Most recent weight: Weight: 76.7 kg     Palliative Assessment/Data:     Time In: 1:55 Time Out: 2:45 Time Total: 50 min Greater than 50%  of this time was spent counseling and coordinating care related to the above assessment and plan.  Signed by: Asencion Gowda, NP   Please contact Palliative Medicine Team phone at [phone] for questions and concerns.  For individual provider: See Shea Evans

## 2018-10-30 NOTE — Care Management Important Message (Signed)
Important Message  Patient Details  Name: Alex Larson MRN: [id number] Date of Birth: February 20, 1927   Medicare Important Message Given:  Yes    Dannette Barbara 10/30/2018, 12:34 PM

## 2018-10-30 NOTE — Progress Notes (Signed)
Initial Nutrition Assessment  DOCUMENTATION CODES:   Not applicable  INTERVENTION:   Ensure Enlive po BID, each supplement provides 350 kcal and 20 grams of protein  MVI daily   2 gram sodium diet   NUTRITION DIAGNOSIS:   Inadequate oral intake related to acute illness as evidenced by meal completion < 50%.  GOAL:   Patient will meet greater than or equal to 90% of their needs  MONITOR:   PO intake, Supplement acceptance, Labs, Weight trends, I & O's, Skin  REASON FOR ASSESSMENT:   Consult Diet education  ASSESSMENT:   85 year old male with history of CAD, chronic systolic heart failure, chronic kidney disease stage III and chronic hypoxic respiratory failure on 2 L of oxygen at home who presented to the emergency room due to shortness of breath and lower extremity edema.  RD working remotely.  Per chart review, pt currently eating 25% of meals. Will hold off on education until pt's appetite and oral intake improves as RD does not want patient restricting any foods at this time. RD will also add supplements to help pt meet his estimated needs. There is no weight history in chart to determine if any recent weight loss. Will provided patient with CHF diet education at follow up once pt's appetite improves.   Medications reviewed and include: aspirin, lovenox, ferrous sulfate, folic acid, synthroid, MVI, protonix  Labs reviewed: BUN 70(H), creat 3.61(H) Wbc- 3.5(L)  Unable to complete Nutrition-Focused physical exam at this time.   Diet Order:   Diet Order            Diet Heart Room service appropriate? Yes; Fluid consistency: Thin  Diet effective now             EDUCATION NEEDS:   Not appropriate for education at this time  Skin:  Skin Assessment: Reviewed RN Assessment  Last BM:  5/27- type 3  Height:   Ht Readings from Last 1 Encounters:  10/27/18 6\' 2"  (1.88 m)    Weight:   Wt Readings from Last 1 Encounters:  10/30/18 76.7 kg    Ideal Body  Weight:  86.3 kg  BMI:  Body mass index is 21.7 kg/m.  Estimated Nutritional Needs:   Kcal:  1800-2100kcal/day   Protein:  90-105g/day   Fluid:  per MD  Koleen Distance MS, RD, LDN Pager #- [phone] Office#- [phone] After Hours Pager: [phone]

## 2018-10-30 NOTE — Progress Notes (Signed)
[facility] [place] NAME: Alex Larson    MR#:  [id number]  DATE OF BIRTH:  1926/06/21  SUBJECTIVE:  Patient with less SOB this am No CP still with LEE   REVIEW OF SYSTEMS:    Review of Systems  Constitutional: Negative for fever, chills weight loss HENT: Negative for ear pain, nosebleeds, congestion, facial swelling, rhinorrhea, neck pain, neck stiffness and ear discharge.   Respiratory: Negative for cough, shortness of breath (IMPROVED), no wheezing  Cardiovascular: Negative for chest pain, palpitations and ++ leg swelling.  Gastrointestinal: Negative for heartburn, abdominal pain, vomiting, diarrhea or consitpation Genitourinary: Negative for dysuria, urgency, frequency, hematuria Musculoskeletal: Negative for back pain or joint pain Neurological: Negative for dizziness, seizures, syncope, focal weakness,  numbness and headaches.  Hematological: Does not bruise/bleed easily.  Psychiatric/Behavioral: Negative for hallucinations, confusion, dysphoric mood    Tolerating Diet: yes      DRUG ALLERGIES:  No Known Allergies  VITALS:  Blood pressure 127/62, pulse 69, temperature 97.7 F (36.5 C), temperature source Oral, resp. rate 16, height 6\' 2"  (1.88 m), weight 76.7 kg, SpO2 98 %.  PHYSICAL EXAMINATION:  Constitutional: Appears well-developed and well-nourished. No distress. HENT: Normocephalic. Marland Kitchen Oropharynx is clear and moist.  Eyes: Conjunctivae and EOM are normal. PERRLA, no scleral icterus.  Neck: Normal ROM. Neck supple. ++ JVD. No tracheal deviation. CVS: RRR, S1/S2 +, 2/6 murmurs, no gallops, no carotid bruit.  Pulmonary: Effort and breath sounds normal, no stridor, rhonchi, wheezes, rales.  Abdominal: Soft. BS +,  no distension, tenderness, rebound or guarding.  Musculoskeletal: Normal range of motion. 1+ LEE  Neuro: Alert. CN 2-12 grossly intact. No focal deficits. Skin: Skin is warm and dry. No rash  noted. Psychiatric: Normal mood and affect.      LABORATORY PANEL:   CBC Recent Labs  Lab 10/29/18 0501  WBC 3.5*  HGB 11.4*  HCT 33.5*  PLT 122*   ------------------------------------------------------------------------------------------------------------------  Chemistries  Recent Labs  Lab 10/27/18 0352  10/28/18 0014  10/30/18 0319  NA 143  --  143   < > 140  K 4.2  --  4.5   < > 3.8  CL 115*  --  116*   < > 112*  CO2 16*  --  18*   < > 19*  GLUCOSE 107*  --  80   < > 101*  BUN 58*  --  59*   < > 70*  CREATININE 3.55*   < > 3.35*   < > 3.61*  CALCIUM 10.0  --  9.4   < > 9.5  MG  --   --  1.8  --   --   AST 34  --   --   --   --   ALT 23  --   --   --   --   ALKPHOS 92  --   --   --   --   BILITOT 1.7*  --   --   --   --    < > = values in this interval not displayed.   ------------------------------------------------------------------------------------------------------------------  Cardiac Enzymes Recent Labs  Lab 10/27/18 0639 10/27/18 1039 10/27/18 1732  TROPONINI 0.33* 0.37* 0.33*   ------------------------------------------------------------------------------------------------------------------  RADIOLOGY:  No results found.   ASSESSMENT AND PLAN:   85 year old male with history of CAD, chronic systolic heart failure, chronic kidney disease stage III and chronic hypoxic respiratory failure on 2 L of oxygen at home  who presented to the emergency room due to shortness of breath and lower extremity edema.   1.  Acute on chronic toxic respiratory failure in the setting of acute on chronic systolic heart failure EF 10-15%: Continue IV Lasix 80 mg twice daily and INDAPAMIDE CHF clinic upon discharge Not currently on ACE inhibitor/ARB/entresto due to acute kidney injury Continue to monitor intake and output Daily weight Cardiology consultation appreciated Continue Bidil Wean oxygen to 2 L baseline as tolerated.   2.  Elevated troponin due to  demand ischemia history of CAD: Continue comanagement with aspirin and atorvastatin.   2D Echo 10/28/2018: 1. The left ventricle has severely reduced systolic function, with an ejection fraction of 10-15%. The cavity size was normal. There is moderately increased left ventricular wall thickness. Left ventricular diastolic Doppler parameters are consistent with impaired relaxation. Left ventricular diffuse hypokinesis. 2. The right ventricle has severely reduced systolic function. The cavity was moderately enlarged. There is no increase in right ventricular wall thickness. Right ventricular systolic pressure is severely elevated with an estimated pressure of 70.4 mmHg. 3. Left atrial size was mildly dilated. 4. Right atrial size was moderately dilated. 5. Trivial pericardial effusion is present. 6. The mitral valve is degenerative. Mild thickening of the mitral valve leaflet. 7. Tricuspid valve regurgitation is moderate-severe. 8. The aortic valve is tricuspid. Severely thickening of the aortic valve. Sclerosis without any evidence of stenosis of the aortic valve. 9. The aortic root is normal in size and structure. 10. The inferior vena cava was dilated in size with <50% respiratory variability.   3.  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 Avoid nephrotoxic medications  4.  Essential hypertension: Continue BiDil and lower dose of Coreg  5.  Hypothyroidism (new diagnosis): Started on Synthroid 25 mcg daily. Repeat TFTs in 6 weeks  Palliative care consult for [place]   Management plans discussed with the patient and he is in agreement.  CODE STATUS: full  TOTAL TIME TAKING CARE OF THIS PATIENT: 24 minutes.     POSSIBLE D/C 2-4 days, DEPENDING ON CLINICAL CONDITION.   Bettey Costa M.D on 10/30/2018 at 11:12 AM  Between 7am to 6pm -  Pager - [phone] After 6pm go to [email or web address] - password [email or web address] [facility]  Office  [phone]  CC: Primary care physician; Patient, No Pcp Per  Note: This dictation was prepared with Dragon dictation along with smaller phrase technology. Any transcriptional errors that result from this process are unintentional.

## 2018-10-31 LAB — BASIC METABOLIC PANEL
Anion gap: 12 (ref 5–15)
BUN: 74 mg/dL — ABNORMAL HIGH (ref 8–23)
CO2: 21 mmol/L — ABNORMAL LOW (ref 22–32)
Calcium: 9.6 mg/dL (ref 8.9–10.3)
Chloride: 108 mmol/L (ref 98–111)
Creatinine, Ser: 3.71 mg/dL — ABNORMAL HIGH (ref 0.61–1.24)
GFR calc Af Amer: 16 mL/min — ABNORMAL LOW (ref >60)
GFR calc non Af Amer: 14 mL/min — ABNORMAL LOW (ref >60)
Glucose, Bld: 95 mg/dL (ref 70–99)
Potassium: 3.7 mmol/L (ref 3.5–5.1)
Sodium: 141 mmol/L (ref 135–145)

## 2018-10-31 NOTE — TOC Progression Note (Signed)
Transition of Care [facility]) - Progression Note    Patient Details  Name: Alex Larson MRN: [id number] Date of Birth: 1926/06/14  Transition of Care [facility]) CM/SW Contact  Elza Rafter, RN Phone Number: 10/31/2018, 4:20 PM  Clinical Narrative:   Tiajuana Amass to DC-IV lasix 80 BID         Expected Discharge Plan and Services                                                 Social Determinants of Health (SDOH) Interventions    Readmission Risk Interventions No flowsheet data found.

## 2018-10-31 NOTE — Progress Notes (Signed)
New referral for outpatient Palliative to follow at home received following a Palliative Medicine consult. Patient information faxed to referral. CMRN Geoffry Paradise aware. Flo Shanks BSN, RN, [facility] [employer] [phone]

## 2018-10-31 NOTE — Progress Notes (Signed)
[facility]  ROUNDING NOTE   Subjective:   UOP 2844mL  Furosemide 120mg  IV bid  Objective:  Vital signs in last 24 hours:  Temp:  [98 F (36.7 C)-98.1 F (36.7 C)] 98.1 F (36.7 C) (05/29 0804) Pulse Rate:  [49-59] 54 (05/29 0950) Resp:  [16-18] 18 (05/29 0510) BP: (109-128)/(59-71) 127/59 (05/29 0950) SpO2:  [90 %-99 %] 97 % (05/29 0804) Weight:  [72.6 kg] 72.6 kg (05/29 0510)  Weight change: -4.037 kg Filed Weights   10/29/18 0423 10/30/18 0406 10/31/18 0510  Weight: 77.5 kg 76.7 kg 72.6 kg    Intake/Output: I/O last 3 completed shifts: In: 240 [P.O.:240] Out: 3390 [Urine:3390]   Intake/Output this shift:  Total I/O In: 240 [P.O.:240] Out: 500 [Urine:500]  Physical Exam: General: NAD, laying in bed  Head: Normocephalic, atraumatic. Moist oral mucosal membranes  Eyes: Anicteric, PERRL  Neck: Supple, trachea midline  Lungs:  Clear to auscultation  Heart: Regular rate and rhythm  Abdomen:  Soft, nontender,   Extremities:  1+ peripheral edema.  Neurologic: Nonfocal, moving all four extremities  Skin: No lesions        Basic Metabolic Panel: Recent Labs  Lab 10/27/18 0352 10/27/18 0639 10/28/18 0014 10/29/18 0501 10/30/18 0319 10/31/18 0305  NA 143  --  143 140 140 141  K 4.2  --  4.5 4.0 3.8 3.7  CL 115*  --  116* 113* 112* 108  CO2 16*  --  18* 19* 19* 21*  GLUCOSE 107*  --  80 86 101* 95  BUN 58*  --  59* 64* 70* 74*  CREATININE 3.55* 3.45* 3.35* 3.52* 3.61* 3.71*  CALCIUM 10.0  --  9.4 9.2 9.5 9.6  MG  --   --  1.8  --   --   --     Liver Function Tests: Recent Labs  Lab 10/27/18 0352  AST 34  ALT 23  ALKPHOS 92  BILITOT 1.7*  PROT 6.7  ALBUMIN 3.7   No results for input(s): LIPASE, AMYLASE in the last 168 hours. No results for input(s): AMMONIA in the last 168 hours.  CBC: Recent Labs  Lab 10/27/18 0352 10/27/18 0639 10/28/18 0014 10/29/18 0501  WBC 6.0 4.4 3.5* 3.5*  NEUTROABS 4.9  --   --   --   HGB 13.8  13.4 12.4* 11.4*  HCT 42.9 40.5 37.2* 33.5*  MCV 89.4 89.2 86.7 85.0  PLT 140* 134* 123* 122*    Cardiac Enzymes: Recent Labs  Lab 10/27/18 0352 10/27/18 0639 10/27/18 1039 10/27/18 1732  TROPONINI 0.30* 0.33* 0.37* 0.33*    BNP: Invalid input(s): POCBNP  CBG: No results for input(s): GLUCAP in the last 168 hours.  Microbiology: Results for orders placed or performed during the hospital encounter of 10/27/18  SARS Coronavirus 2 (CEPHEID - Performed in [place] hospital lab), Hosp Order     Status: None   Collection Time: 10/27/18  3:52 AM  Result Value Ref Range Status   SARS Coronavirus 2 NEGATIVE NEGATIVE Final    Comment: (NOTE) If result is NEGATIVE SARS-CoV-2 target nucleic acids are NOT DETECTED. The SARS-CoV-2 RNA is generally detectable in upper and lower  respiratory specimens during the acute phase of infection. The lowest  concentration of SARS-CoV-2 viral copies this assay can detect is 250  copies / mL. A negative result does not preclude SARS-CoV-2 infection  and should not be used as the sole basis for treatment or other  patient management decisions.  A negative result may occur with  improper specimen collection / handling, submission of specimen other  than nasopharyngeal swab, presence of viral mutation(s) within the  areas targeted by this assay, and inadequate number of viral copies  (<250 copies / mL). A negative result must be combined with clinical  observations, patient history, and epidemiological information. If result is POSITIVE SARS-CoV-2 target nucleic acids are DETECTED. The SARS-CoV-2 RNA is generally detectable in upper and lower  respiratory specimens dur ing the acute phase of infection.  Positive  results are indicative of active infection with SARS-CoV-2.  Clinical  correlation with patient history and other diagnostic information is  necessary to determine patient infection status.  Positive results do  not rule out bacterial  infection or co-infection with other viruses. If result is PRESUMPTIVE POSTIVE SARS-CoV-2 nucleic acids MAY BE PRESENT.   A presumptive positive result was obtained on the submitted specimen  and confirmed on repeat testing.  While 2019 novel coronavirus  (SARS-CoV-2) nucleic acids may be present in the submitted sample  additional confirmatory testing may be necessary for epidemiological  and / or clinical management purposes  to differentiate between  SARS-CoV-2 and other Sarbecovirus currently known to infect humans.  If clinically indicated additional testing with an alternate test  methodology [phone]) is advised. The SARS-CoV-2 RNA is generally  detectable in upper and lower respiratory sp ecimens during the acute  phase of infection. The expected result is Negative. Fact Sheet for Patients:  StrictlyIdeas.no Fact Sheet for Healthcare Providers: BankingDealers.co.za This test is not yet approved or cleared by the [place] FDA and has been authorized for detection and/or diagnosis of SARS-CoV-2 by FDA under an Emergency Use Authorization (EUA).  This EUA will remain in effect (meaning this test can be used) for the duration of the COVID-19 declaration under Section 564(b)(1) of the Act, 21 U.S.C. section 360bbb-3(b)(1), unless the authorization is terminated or revoked sooner. Performed at [facility], [address] [place], [place] [address]     Coagulation Studies: No results for input(s): LABPROT, INR in the last 72 hours.  Urinalysis: Recent Labs    10/28/18 1923  COLORURINE YELLOW*  LABSPEC 1.006  PHURINE 5.0  GLUCOSEU NEGATIVE  HGBUR SMALL*  BILIRUBINUR NEGATIVE  KETONESUR NEGATIVE  PROTEINUR NEGATIVE  NITRITE NEGATIVE  LEUKOCYTESUR NEGATIVE      Imaging: No results found.   Medications:   . sodium chloride    . furosemide 120 mg (10/31/18 0831)   . aspirin EC  81 mg Oral Daily  .  atorvastatin  40 mg Oral q1800  . enoxaparin (LOVENOX) injection  30 mg Subcutaneous Q24H  . feeding supplement (ENSURE ENLIVE)  237 mL Oral BID BM  . ferrous sulfate  325 mg Oral Daily  . folic acid  1 mg Oral Daily  . isosorbide-hydrALAZINE  1 tablet Oral TID  . levothyroxine  25 mcg Oral QAC breakfast  . multivitamin with minerals  1 tablet Oral Daily  . pantoprazole  40 mg Oral Daily  . sodium chloride flush  3 mL Intravenous Q12H   sodium chloride, acetaminophen, ondansetron (ZOFRAN) IV, sodium chloride flush  Assessment/ Plan:  Mr. Alex Larson is a 85 y.o. black male with congestive heart failure, coronary artery disease, peripheral vascular disease, anemiawho was [facility]1937 for Acute systolic congestive heart failure  1. Acute renal failure: with chronic kidney disease stage IV. Baseline creatinine is 3.7, GFR of 19 on 09/11/2018 No proteinuria, urinalysis with  hematuria.  Acute renal failure secondary to acute cardiorenal syndrome Nonoliguric urine output  2. Acute exacerbation of systolic congestive heart failure  3. Hypertension  Plan Continue diuretics: furosemide. Increased dose to 120mg  bid.  No indication for dialysis.  Discontinue indapamide.    LOS: 4 Alex Larson 5/29/202012:43 PM

## 2018-10-31 NOTE — Plan of Care (Signed)
  Problem: Activity: Goal: Capacity to carry out activities will improve Outcome: Progressing   Problem: Clinical Measurements: Goal: Ability to maintain clinical measurements within normal limits will improve Outcome: Progressing Goal: Respiratory complications will improve Outcome: Progressing   Problem: Safety: Goal: Ability to remain free from injury will improve Outcome: Progressing

## 2018-10-31 NOTE — Progress Notes (Signed)
[place] [facility] [place] NAME: Alex Larson    MR#:  [id number]  DATE OF BIRTH:  01-Jul-1926  SUBJECTIVE:  Patient with less SOB this am No CP still with LEE   REVIEW OF SYSTEMS:    Review of Systems  Constitutional: Negative for fever, chills weight loss HENT: Negative for ear pain, nosebleeds, congestion, facial swelling, rhinorrhea, neck pain, neck stiffness and ear discharge.   Respiratory: Negative for cough, shortness of breath (IMPROVED), no wheezing  Cardiovascular: Negative for chest pain, palpitations and ++ leg swelling.  Gastrointestinal: Negative for heartburn, abdominal pain, vomiting, diarrhea or consitpation Genitourinary: Negative for dysuria, urgency, frequency, hematuria Musculoskeletal: Negative for back pain or joint pain Neurological: Negative for dizziness, seizures, syncope, focal weakness,  numbness and headaches.  Hematological: Does not bruise/bleed easily.  Psychiatric/Behavioral: Negative for hallucinations, confusion, dysphoric mood    Tolerating Diet: yes      DRUG ALLERGIES:  No Known Allergies  VITALS:  Blood pressure (!) 127/59, pulse (!) 54, temperature 98.1 F (36.7 C), temperature source Oral, resp. rate 18, height 6\' 2"  (1.88 m), weight 72.6 kg, SpO2 97 %.  PHYSICAL EXAMINATION:  Constitutional: Appears well-developed and well-nourished. No distress. HENT: Normocephalic. Marland Kitchen Oropharynx is clear and moist.  Eyes: Conjunctivae and EOM are normal. PERRLA, no scleral icterus.  Neck: Normal ROM. Neck supple. ++ JVD. No tracheal deviation. CVS: RRR, S1/S2 +, 2/6 murmurs, no gallops, no carotid bruit.  Pulmonary: Effort and breath sounds normal, no stridor, rhonchi, wheezes, rales.  Abdominal: Soft. BS +,  no distension, tenderness, rebound or guarding.  Musculoskeletal: Normal range of motion. 2+ LEE  Neuro: Alert. CN 2-12 grossly intact. No focal deficits. Skin: Skin is warm and dry. No rash  noted. Psychiatric: Normal mood and affect.      LABORATORY PANEL:   CBC Recent Labs  Lab 10/29/18 0501  WBC 3.5*  HGB 11.4*  HCT 33.5*  PLT 122*   ------------------------------------------------------------------------------------------------------------------  Chemistries  Recent Labs  Lab 10/27/18 0352  10/28/18 0014  10/31/18 0305  NA 143  --  143   < > 141  K 4.2  --  4.5   < > 3.7  CL 115*  --  116*   < > 108  CO2 16*  --  18*   < > 21*  GLUCOSE 107*  --  80   < > 95  BUN 58*  --  59*   < > 74*  CREATININE 3.55*   < > 3.35*   < > 3.71*  CALCIUM 10.0  --  9.4   < > 9.6  MG  --   --  1.8  --   --   AST 34  --   --   --   --   ALT 23  --   --   --   --   ALKPHOS 92  --   --   --   --   BILITOT 1.7*  --   --   --   --    < > = values in this interval not displayed.   ------------------------------------------------------------------------------------------------------------------  Cardiac Enzymes Recent Labs  Lab 10/27/18 0639 10/27/18 1039 10/27/18 1732  TROPONINI 0.33* 0.37* 0.33*   ------------------------------------------------------------------------------------------------------------------  RADIOLOGY:  No results found.   ASSESSMENT AND PLAN:   85 year old male with history of CAD, chronic systolic heart failure, chronic kidney disease stage III and chronic hypoxic respiratory failure on 2 L of oxygen  at home who presented to the emergency room due to shortness of breath and lower extremity edema.   1.  Acute on chronic toxic respiratory failure in the setting of acute on chronic systolic heart failure EF 10-15%: Patient still with marked volume overload Continue IV Lasix 80 mg twice daily and INDAPAMIDE (cardiology considering Lasix drip instead) CHF clinic upon discharge Not currently on ACE inhibitor/ARB/entresto due to acute kidney injury Continue to monitor intake and output Daily weight Cardiology consultation appreciated Continue  Bidil Wean oxygen to 2 L baseline as tolerated.   2.  Elevated troponin due to demand ischemia history of CAD: Continue comanagement with aspirin and atorvastatin.   2D Echo 10/28/2018: 1. The left ventricle has severely reduced systolic function, with an ejection fraction of 10-15%. The cavity size was normal. There is moderately increased left ventricular wall thickness. Left ventricular diastolic Doppler parameters are consistent with impaired relaxation. Left ventricular diffuse hypokinesis. 2. The right ventricle has severely reduced systolic function. The cavity was moderately enlarged. There is no increase in right ventricular wall thickness. Right ventricular systolic pressure is severely elevated with an estimated pressure of 70.4 mmHg. 3. Left atrial size was mildly dilated. 4. Right atrial size was moderately dilated. 5. Trivial pericardial effusion is present. 6. The mitral valve is degenerative. Mild thickening of the mitral valve leaflet. 7. Tricuspid valve regurgitation is moderate-severe. 8. The aortic valve is tricuspid. Severely thickening of the aortic valve. Sclerosis without any evidence of stenosis of the aortic valve. 9. The aortic root is normal in size and structure. 10. The inferior vena cava was dilated in size with <50% respiratory variability.   3.  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 Avoid nephrotoxic medications Creatinine slowly rising in the setting of diuresis.   4.  Essential hypertension: Continue BiDil and lower dose of Coreg  5.  Hypothyroidism (new diagnosis): Started on Synthroid 25 mcg daily. Repeat TFTs in 6 weeks  Palliative care consult appreciated.  Patient will need outpatient palliative care upon discharge.   Management plans discussed with the patient and he is in  agreement.  CODE STATUS: DNR TOTAL TIME TAKING CARE OF THIS PATIENT: 24 minutes.   Discussed with cardiology  POSSIBLE D/C 2-4 days, DEPENDING ON CLINICAL CONDITION.   Bettey Costa M.D on 10/31/2018 at 10:50 AM  Between 7am to 6pm - Pager - [phone] After 6pm go to [email or web address] - password [email or web address] [facility]  Office  [phone]  CC: Primary care physician; Patient, No Pcp Per  Note: This dictation was prepared with Dragon dictation along with smaller phrase technology. Any transcriptional errors that result from this process are unintentional.

## 2018-10-31 NOTE — Progress Notes (Signed)
Progress Note  Patient Name: Alex Larson Date of Encounter: 10/31/2018  Primary Cardiologist: Nelva Bush, MD  Subjective   Breathing stable.  Got up to chair yesterday w/o chest pain/dyspnea.  Hasn't ambulated yet.    Inpatient Medications    Scheduled Meds: . aspirin EC  81 mg Oral Daily  . atorvastatin  40 mg Oral q1800  . carvedilol  3.125 mg Oral Daily  . enoxaparin (LOVENOX) injection  30 mg Subcutaneous Q24H  . feeding supplement (ENSURE ENLIVE)  237 mL Oral BID BM  . ferrous sulfate  325 mg Oral Daily  . folic acid  1 mg Oral Daily  . indapamide  2.5 mg Oral Daily  . isosorbide-hydrALAZINE  1 tablet Oral TID  . levothyroxine  25 mcg Oral QAC breakfast  . multivitamin with minerals  1 tablet Oral Daily  . pantoprazole  40 mg Oral Daily  . sodium chloride flush  3 mL Intravenous Q12H   Continuous Infusions: . sodium chloride    . furosemide 120 mg (10/31/18 0831)   PRN Meds: sodium chloride, acetaminophen, ondansetron (ZOFRAN) IV, sodium chloride flush   Vital Signs    Vitals:   10/30/18 1643 10/30/18 2009 10/31/18 0510 10/31/18 0804  BP: 109/67 126/71 128/71 118/65  Pulse: (!) 55 (!) 56 (!) 59 (!) 49  Resp:  16 18   Temp:  98.1 F (36.7 C) 98 F (36.7 C) 98.1 F (36.7 C)  TempSrc:  Oral Oral Oral  SpO2: 94% 99% 90% 97%  Weight:   72.6 kg   Height:        Intake/Output Summary (Last 24 hours) at 10/31/2018 0854 Last data filed at 10/31/2018 9233 Gross per 24 hour  Intake 240 ml  Output 2890 ml  Net -2650 ml   Filed Weights   10/29/18 0423 10/30/18 0406 10/31/18 0510  Weight: 77.5 kg 76.7 kg 72.6 kg    Physical Exam   GEN: Well nourished, well developed, in no acute distress.  HEENT: Grossly normal.  Neck: Supple, JVD to jaw, no carotid bruits, or masses. Cardiac: RRR, 2/6 syst murmur throughout, no rubs, or gallops. No clubbing, cyanosis, 2+ bilat LE edema.  Radials/DP/PT 1+ and equal bilaterally.  Respiratory:  Respirations  regular and unlabored, bibasilar crackles. GI: Soft, nontender, nondistended, BS + x 4. MS: no deformity or atrophy. Skin: warm and dry, no rash. Neuro:  Strength and sensation are intact. Psych: AAOx3.  Normal affect.  Labs    Chemistry Recent Labs  Lab 10/27/18 0352  10/29/18 0501 10/30/18 0319 10/31/18 0305  NA 143   < > 140 140 141  K 4.2   < > 4.0 3.8 3.7  CL 115*   < > 113* 112* 108  CO2 16*   < > 19* 19* 21*  GLUCOSE 107*   < > 86 101* 95  BUN 58*   < > 64* 70* 74*  CREATININE 3.55*   < > 3.52* 3.61* 3.71*  CALCIUM 10.0   < > 9.2 9.5 9.6  PROT 6.7  --   --   --   --   ALBUMIN 3.7  --   --   --   --   AST 34  --   --   --   --   ALT 23  --   --   --   --   ALKPHOS 92  --   --   --   --   BILITOT 1.7*  --   --   --   --  GFRNONAA 14*   < > 14* 14* 14*  GFRAA 17*   < > 17* 16* 16*  ANIONGAP 12   < > 8 9 12    < > = values in this interval not displayed.     Hematology Recent Labs  Lab 10/27/18 0639 10/28/18 0014 10/29/18 0501  WBC 4.4 3.5* 3.5*  RBC 4.54 4.29 3.94*  HGB 13.4 12.4* 11.4*  HCT 40.5 37.2* 33.5*  MCV 89.2 86.7 85.0  MCH 29.5 28.9 28.9  MCHC 33.1 33.3 34.0  RDW 20.5* 20.2* 19.7*  PLT 134* 123* 122*    Cardiac Enzymes Recent Labs  Lab 10/27/18 0352 10/27/18 0639 10/27/18 1039 10/27/18 1732  TROPONINI 0.30* 0.33* 0.37* 0.33*     BNP Recent Labs  Lab 10/27/18 0352 10/27/18 0639  BNP >4,500.0* >4,500.0*     Radiology    No results found.  Telemetry    Sinus brady - Personally Reviewed  Cardiac Studies   Echocardiogram (10/28/2018): 1. The left ventricle has severely reduced systolic function, with an ejection fraction of 10-15%. The cavity size was normal. There is moderately increased left ventricular wall thickness. Left ventricular diastolic Doppler parameters are consistent  with impaired relaxation. Left ventricular diffuse hypokinesis. 2. The right ventricle has severely reduced systolic function. The cavity was  moderately enlarged. There is no increase in right ventricular wall thickness. Right ventricular systolic pressure is severely elevated with an estimated pressure of 70.4  mmHg. 3. Left atrial size was mildly dilated. 4. Right atrial size was moderately dilated. 5. Trivial pericardial effusion is present. 6. The mitral valve is degenerative. Mild thickening of the mitral valve leaflet. 7. Tricuspid valve regurgitation is moderate-severe. 8. The aortic valve is tricuspid. Severely thickening of the aortic valve. Sclerosis without any evidence of stenosis of the aortic valve. 9. The aortic root is normal in size and structure. 10. The inferior vena cava was dilated in size with <50% respiratory variability.  Patient Profile     85 y.o.malewith history of CAD status post PCI x2 (details unknown), chronic HFrEF, CKD (uncertain baseline creatinine), PAD status post right lower extremity bypass, chronic respiratory failure with hypoxia on supplemental oxygen, and hypertension, admitted with acute on chronic systolic and diastolic heart failure.  Assessment & Plan    1.  Acute on chronic systolic and diast CHF/ICM: EF 10-15% by echo this admission.  Breathing has been stable over past few days.  O2 down to 2.5 lpm this AM.  Has gotten up to chair w/o difficulty but hasn't really ambulated otherwise.  Intake inaccurate all week.  About 1979ml out yesterday.  Wt recorded as down 4 kg this AM.  Still markedly volume overloaded.  BUN/Creat up to 74/3.71. Bicarb slowly rising (21 this AM).  He continues to need aggressive diuresis and I am concerned that he may require inotropic support given cardiorenal syndrome.  Will discuss with Dr. Saunders Revel.  Currently on lasix 120 IV bid.  ? Switch to gtt @ 12/hr.  Cont low dose  blocker (baseline bradycardia) and bidil. No ACEI/ARB/ARNI/Aldosterone antagonist in setting of CKD.  2.  Acute on chronic resp failure w/ hypoxia:  In setting of above.  O2 down to 2.5  lpm (baseline is 2lpm @ home).  3.  Hypothyroidism:  TSH 117.6 this admission.  Cont levothyroxine.  Will need close outpt f/u and probably med adjustment.  ? Role in low output CHF/edema.  4.  Stage IV-V CKD:  As above, BUN/Creat slowly rising in the setting  of diuresis.  Nephrology following.   Signed, Murray Hodgkins, NP  10/31/2018, 8:54 AM    For questions or updates, please contact   Please consult [email or web address] for contact info under Cardiology/STEMI.

## 2018-11-01 DIAGNOSIS — I5021 Acute systolic (congestive) heart failure: Secondary | ICD-10-CM

## 2018-11-01 LAB — BASIC METABOLIC PANEL
Anion gap: 10 (ref 5–15)
BUN: 80 mg/dL — ABNORMAL HIGH (ref 8–23)
CO2: 24 mmol/L (ref 22–32)
Calcium: 9.4 mg/dL (ref 8.9–10.3)
Chloride: 104 mmol/L (ref 98–111)
Creatinine, Ser: 3.69 mg/dL — ABNORMAL HIGH (ref 0.61–1.24)
GFR calc Af Amer: 16 mL/min — ABNORMAL LOW (ref >60)
GFR calc non Af Amer: 14 mL/min — ABNORMAL LOW (ref >60)
Glucose, Bld: 83 mg/dL (ref 70–99)
Potassium: 3.6 mmol/L (ref 3.5–5.1)
Sodium: 138 mmol/L (ref 135–145)

## 2018-11-01 MED ORDER — FUROSEMIDE 40 MG PO TABS
80.0000 mg | ORAL_TABLET | Freq: Two times a day (BID) | ORAL | Status: DC
  Administered 2018-11-01 – 2018-11-02 (×2): 80 mg via ORAL
  Filled 2018-11-01 (×2): qty 2

## 2018-11-01 NOTE — Progress Notes (Signed)
[facility] NAME: Alex Larson    MR#:  [id number]  DATE OF BIRTH:  November 19, 1926  SUBJECTIVE:  Patient seen and evaluated today Has decreased shortness of breath On oxygen via nasal cannula at 2 L Usually ambulates with the help of cane   REVIEW OF SYSTEMS:    Review of Systems  Constitutional: Negative for fever, chills weight loss HENT: Negative for ear pain, nosebleeds, congestion, facial swelling, rhinorrhea, neck pain, neck stiffness and ear discharge.   Respiratory: Negative for cough, shortness of breath (IMPROVED), no wheezing  Cardiovascular: Negative for chest pain, palpitations and ++ leg swelling.  Gastrointestinal: Negative for heartburn, abdominal pain, vomiting, diarrhea or consitpation Genitourinary: Negative for dysuria, urgency, frequency, hematuria Musculoskeletal: Negative for back pain or joint pain Neurological: Negative for dizziness, seizures, syncope, focal weakness,  numbness and headaches.  Hematological: Does not bruise/bleed easily.  Psychiatric/Behavioral: Negative for hallucinations, confusion, dysphoric mood   Tolerating Diet: yes   DRUG ALLERGIES:  No Known Allergies  VITALS:  Blood pressure 118/61, pulse (!) 51, temperature 97.8 F (36.6 C), temperature source Oral, resp. rate 20, height 6\' 2"  (1.88 m), weight 69.5 kg, SpO2 96 %.  PHYSICAL EXAMINATION:  Constitutional: Appears well-developed and well-nourished. No distress. HENT: Normocephalic. Marland Kitchen Oropharynx is clear and moist.  Eyes: Conjunctivae and EOM are normal. PERRLA, no scleral icterus.  Neck: Normal ROM. Neck supple. ++ JVD. No tracheal deviation. CVS: RRR, S1/S2 +, 2/6 murmurs, no gallops, no carotid bruit.  Pulmonary: Effort and breath sounds normal, no stridor, rhonchi, wheezes, rales.  Abdominal: Soft. BS +,  no distension, tenderness, rebound or guarding.  Musculoskeletal: Normal range of motion. 2+ LEE  Neuro: Alert. CN  2-12 grossly intact. No focal deficits. Skin: Skin is warm and dry. No rash noted. Psychiatric: Normal mood and affect.      LABORATORY PANEL:   CBC Recent Labs  Lab 10/29/18 0501  WBC 3.5*  HGB 11.4*  HCT 33.5*  PLT 122*   ------------------------------------------------------------------------------------------------------------------  Chemistries  Recent Labs  Lab 10/27/18 0352  10/28/18 0014  11/01/18 0506  NA 143  --  143   < > 138  K 4.2  --  4.5   < > 3.6  CL 115*  --  116*   < > 104  CO2 16*  --  18*   < > 24  GLUCOSE 107*  --  80   < > 83  BUN 58*  --  59*   < > 80*  CREATININE 3.55*   < > 3.35*   < > 3.69*  CALCIUM 10.0  --  9.4   < > 9.4  MG  --   --  1.8  --   --   AST 34  --   --   --   --   ALT 23  --   --   --   --   ALKPHOS 92  --   --   --   --   BILITOT 1.7*  --   --   --   --    < > = values in this interval not displayed.   ------------------------------------------------------------------------------------------------------------------  Cardiac Enzymes Recent Labs  Lab 10/27/18 0639 10/27/18 1039 10/27/18 1732  TROPONINI 0.33* 0.37* 0.33*   ------------------------------------------------------------------------------------------------------------------  RADIOLOGY:  No results found.   ASSESSMENT AND PLAN:   85 year old male with history of CAD, chronic systolic heart failure, chronic kidney disease stage III and  chronic hypoxic respiratory failure on 2 L of oxygen at home who presented to the emergency room due to shortness of breath and lower extremity edema.   1.  Acute on chronic toxic respiratory failure in the setting of acute on chronic systolic heart failure EF 10-15%: Patients volume overload improving slowly Switch to oral Lasix 80 mg twice daily and INDAPAMIDE (cardiology considering Lasix drip instead) CHF clinic upon discharge Not currently on ACE inhibitor/ARB/entresto due to acute kidney injury Continue to monitor  intake and output Daily weight Cardiology consultation appreciated Continue Bidil Wean oxygen to 2 L baseline as tolerated.   2.  Elevated troponin due to demand ischemia history of CAD: Continue comanagement with aspirin and atorvastatin.   2D Echo 10/28/2018: 1. The left ventricle has severely reduced systolic function, with an ejection fraction of 10-15%. The cavity size was normal. There is moderately increased left ventricular wall thickness. Left ventricular diastolic Doppler parameters are consistent with impaired relaxation. Left ventricular diffuse hypokinesis. 2. The right ventricle has severely reduced systolic function. The cavity was moderately enlarged. There is no increase in right ventricular wall thickness. Right ventricular systolic pressure is severely elevated with an estimated pressure of 70.4 mmHg. 3. Left atrial size was mildly dilated. 4. Right atrial size was moderately dilated. 5. Trivial pericardial effusion is present. 6. The mitral valve is degenerative. Mild thickening of the mitral valve leaflet. 7. Tricuspid valve regurgitation is moderate-severe. 8. The aortic valve is tricuspid. Severely thickening of the aortic valve. Sclerosis without any evidence of stenosis of the aortic valve. 9. The aortic root is normal in size and structure. 10. The inferior vena cava was dilated in size with <50% respiratory variability.  3.  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 Avoid nephrotoxic medications Creatinine slowly rising in the setting of diuresis.  4.  Essential hypertension: Continue BiDil and lower dose of Coreg  5.  Hypothyroidism (new diagnosis): Started on Synthroid 25 mcg daily. Repeat TFTs in 6 weeks  Palliative care consult appreciated.  Patient will need outpatient palliative care upon  discharge.   Management plans discussed with the patient and he is in agreement.  CODE STATUS: DNR TOTAL TIME TAKING CARE OF THIS PATIENT: 32 minutes.   Discussed with cardiology  POSSIBLE D/C 2-4 days, DEPENDING ON CLINICAL CONDITION.   Reatha Harps  M.D on 11/01/2018 at 1:40 PM  Between 7am to 6pm - Pager - [phone] After 6pm go to [email or web address] - password [email or web address] [facility]  Office  [phone]  CC: Primary care physician; Patient, No Pcp Per  Note: This dictation was prepared with Dragon dictation along with smaller phrase technology. Any transcriptional errors that result from this process are unintentional.

## 2018-11-01 NOTE — Progress Notes (Signed)
Attempted to call family contact for mandatory daily update, no answer at this time. Wenda Low [facility]

## 2018-11-01 NOTE — Progress Notes (Signed)
[facility]  ROUNDING NOTE   Subjective:   UOP 3572mL Furosemide 120mg  IV bid  Objective:  Vital signs in last 24 hours:  Temp:  [97.4 F (36.3 C)-98.4 F (36.9 C)] 97.8 F (36.6 C) (05/30 0715) Pulse Rate:  [51-56] 51 (05/30 0715) Resp:  [20] 20 (05/30 0502) BP: (108-120)/(54-63) 118/61 (05/30 0715) SpO2:  [94 %-96 %] 96 % (05/30 0715) Weight:  [69.5 kg] 69.5 kg (05/30 0531)  Weight change: -3.13 kg Filed Weights   10/30/18 0406 10/31/18 0510 11/01/18 0531  Weight: 76.7 kg 72.6 kg 69.5 kg    Intake/Output: I/O last 3 completed shifts: In: 290 [P.O.:290] Out: 4925 [Urine:4925]   Intake/Output this shift:  Total I/O In: -  Out: 200 [Urine:200]  Physical Exam: General: NAD, laying in bed  Head: Normocephalic, atraumatic. Moist oral mucosal membranes  Eyes: Anicteric, PERRL  Neck: Supple, trachea midline  Lungs:  Clear to auscultation  Heart: Regular rate and rhythm  Abdomen:  Soft, nontender,   Extremities: trace peripheral edema.  Neurologic: Nonfocal, moving all four extremities  Skin: No lesions        Basic Metabolic Panel: Recent Labs  Lab 10/28/18 0014 10/29/18 0501 10/30/18 0319 10/31/18 0305 11/01/18 0506  NA 143 140 140 141 138  K 4.5 4.0 3.8 3.7 3.6  CL 116* 113* 112* 108 104  CO2 18* 19* 19* 21* 24  GLUCOSE 80 86 101* 95 83  BUN 59* 64* 70* 74* 80*  CREATININE 3.35* 3.52* 3.61* 3.71* 3.69*  CALCIUM 9.4 9.2 9.5 9.6 9.4  MG 1.8  --   --   --   --     Liver Function Tests: Recent Labs  Lab 10/27/18 0352  AST 34  ALT 23  ALKPHOS 92  BILITOT 1.7*  PROT 6.7  ALBUMIN 3.7   No results for input(s): LIPASE, AMYLASE in the last 168 hours. No results for input(s): AMMONIA in the last 168 hours.  CBC: Recent Labs  Lab 10/27/18 0352 10/27/18 0639 10/28/18 0014 10/29/18 0501  WBC 6.0 4.4 3.5* 3.5*  NEUTROABS 4.9  --   --   --   HGB 13.8 13.4 12.4* 11.4*  HCT 42.9 40.5 37.2* 33.5*  MCV 89.4 89.2 86.7 85.0  PLT 140*  134* 123* 122*    Cardiac Enzymes: Recent Labs  Lab 10/27/18 0352 10/27/18 0639 10/27/18 1039 10/27/18 1732  TROPONINI 0.30* 0.33* 0.37* 0.33*    BNP: Invalid input(s): POCBNP  CBG: No results for input(s): GLUCAP in the last 168 hours.  Microbiology: Results for orders placed or performed during the hospital encounter of 10/27/18  SARS Coronavirus 2 (CEPHEID - Performed in [place] hospital lab), Hosp Order     Status: None   Collection Time: 10/27/18  3:52 AM  Result Value Ref Range Status   SARS Coronavirus 2 NEGATIVE NEGATIVE Final    Comment: (NOTE) If result is NEGATIVE SARS-CoV-2 target nucleic acids are NOT DETECTED. The SARS-CoV-2 RNA is generally detectable in upper and lower  respiratory specimens during the acute phase of infection. The lowest  concentration of SARS-CoV-2 viral copies this assay can detect is 250  copies / mL. A negative result does not preclude SARS-CoV-2 infection  and should not be used as the sole basis for treatment or other  patient management decisions.  A negative result may occur with  improper specimen collection / handling, submission of specimen other  than nasopharyngeal swab, presence of viral mutation(s) within the  areas targeted  by this assay, and inadequate number of viral copies  (<250 copies / mL). A negative result must be combined with clinical  observations, patient history, and epidemiological information. If result is POSITIVE SARS-CoV-2 target nucleic acids are DETECTED. The SARS-CoV-2 RNA is generally detectable in upper and lower  respiratory specimens dur ing the acute phase of infection.  Positive  results are indicative of active infection with SARS-CoV-2.  Clinical  correlation with patient history and other diagnostic information is  necessary to determine patient infection status.  Positive results do  not rule out bacterial infection or co-infection with other viruses. If result is PRESUMPTIVE  POSTIVE SARS-CoV-2 nucleic acids MAY BE PRESENT.   A presumptive positive result was obtained on the submitted specimen  and confirmed on repeat testing.  While 2019 novel coronavirus  (SARS-CoV-2) nucleic acids may be present in the submitted sample  additional confirmatory testing may be necessary for epidemiological  and / or clinical management purposes  to differentiate between  SARS-CoV-2 and other Sarbecovirus currently known to infect humans.  If clinically indicated additional testing with an alternate test  methodology [phone]) is advised. The SARS-CoV-2 RNA is generally  detectable in upper and lower respiratory sp ecimens during the acute  phase of infection. The expected result is Negative. Fact Sheet for Patients:  StrictlyIdeas.no Fact Sheet for Healthcare Providers: BankingDealers.co.za This test is not yet approved or cleared by the [place] FDA and has been authorized for detection and/or diagnosis of SARS-CoV-2 by FDA under an Emergency Use Authorization (EUA).  This EUA will remain in effect (meaning this test can be used) for the duration of the COVID-19 declaration under Section 564(b)(1) of the Act, 21 U.S.C. section 360bbb-3(b)(1), unless the authorization is terminated or revoked sooner. Performed at [facility], [address] [place], [place] [address]     Coagulation Studies: No results for input(s): LABPROT, INR in the last 72 hours.  Urinalysis: No results for input(s): COLORURINE, LABSPEC, PHURINE, GLUCOSEU, HGBUR, BILIRUBINUR, KETONESUR, PROTEINUR, UROBILINOGEN, NITRITE, LEUKOCYTESUR in the last 72 hours.  Invalid input(s): APPERANCEUR    Imaging: No results found.   Medications:   . sodium chloride    . furosemide 120 mg (11/01/18 0949)   . aspirin EC  81 mg Oral Daily  . atorvastatin  40 mg Oral q1800  . enoxaparin (LOVENOX) injection  30 mg Subcutaneous Q24H  . feeding  supplement (ENSURE ENLIVE)  237 mL Oral BID BM  . ferrous sulfate  325 mg Oral Daily  . folic acid  1 mg Oral Daily  . isosorbide-hydrALAZINE  1 tablet Oral TID  . levothyroxine  25 mcg Oral QAC breakfast  . multivitamin with minerals  1 tablet Oral Daily  . pantoprazole  40 mg Oral Daily  . sodium chloride flush  3 mL Intravenous Q12H   sodium chloride, acetaminophen, ondansetron (ZOFRAN) IV, sodium chloride flush  Assessment/ Plan:  Mr. Alex Larson is a 85 y.o. black male with congestive heart failure, coronary artery disease, peripheral vascular disease, anemiawho was admitted to [facility] 2/45/8099 for Acute systolic congestive heart failure  1. Acute renal failure: with chronic kidney disease stage IV. Baseline creatinine is 3.7, GFR of 19 on 09/11/2018 No proteinuria, urinalysis with hematuria.  Acute renal failure secondary to acute cardiorenal syndrome Nonoliguric urine output  2. Acute exacerbation of systolic congestive heart failure  3. Hypertension  Plan Continue diuretics: furosemide IV No indication for dialysis.  - Change to PO furosemide 80mg  PO bid  LOS: 5 Aarna Mihalko 5/30/202010:23 AM

## 2018-11-01 NOTE — Progress Notes (Signed)
Progress Note  Patient Name: Alex Larson Date of Encounter: 11/01/2018  Primary Cardiologist: Nelva Bush, MD  Subjective   Seems to be breathing better  Inpatient Medications    Scheduled Meds:  aspirin EC  81 mg Oral Daily   atorvastatin  40 mg Oral q1800   enoxaparin (LOVENOX) injection  30 mg Subcutaneous Q24H   feeding supplement (ENSURE ENLIVE)  237 mL Oral BID BM   ferrous sulfate  325 mg Oral Daily   folic acid  1 mg Oral Daily   isosorbide-hydrALAZINE  1 tablet Oral TID   levothyroxine  25 mcg Oral QAC breakfast   multivitamin with minerals  1 tablet Oral Daily   pantoprazole  40 mg Oral Daily   sodium chloride flush  3 mL Intravenous Q12H   Continuous Infusions:  sodium chloride     furosemide 120 mg (10/31/18 1834)   PRN Meds: sodium chloride, acetaminophen, ondansetron (ZOFRAN) IV, sodium chloride flush   Vital Signs    Vitals:   10/31/18 1951 11/01/18 0502 11/01/18 0531 11/01/18 0715  BP: (!) 108/54 111/60  118/61  Pulse: (!) 56 (!) 52  (!) 51  Resp: 20 20    Temp: (!) 97.4 F (36.3 C) 98.4 F (36.9 C)  97.8 F (36.6 C)  TempSrc: Oral Oral  Oral  SpO2: 94% 94%  96%  Weight:   69.5 kg   Height:        Intake/Output Summary (Last 24 hours) at 11/01/2018 0928 Last data filed at 11/01/2018 0715 Gross per 24 hour  Intake 50 ml  Output 3475 ml  Net -3425 ml   Filed Weights   10/30/18 0406 10/31/18 0510 11/01/18 0531  Weight: 76.7 kg 72.6 kg 69.5 kg    Physical Exam   GEN: Well nourished, well developed, in no acute distress.  HEENT: Grossly normal.  Neck: Supple, JVD to jaw, no carotid bruits, or masses. Cardiac: RRR, 2/6 syst murmur throughout, no rubs, or gallops. No clubbing, cyanosis, 2+ bilat LE edema.  Radials/DP/PT 1+ and equal bilaterally.  Respiratory:  Respirations regular and unlabored, bibasilar crackles. GI: Soft, nontender, nondistended, BS + x 4. MS: no deformity or atrophy. Skin: warm and dry, no  rash. Neuro:  Strength and sensation are intact. Psych: AAOx3.  Normal affect.  Labs    Chemistry Recent Labs  Lab 10/27/18 0352  10/30/18 0319 10/31/18 0305 11/01/18 0506  NA 143   < > 140 141 138  K 4.2   < > 3.8 3.7 3.6  CL 115*   < > 112* 108 104  CO2 16*   < > 19* 21* 24  GLUCOSE 107*   < > 101* 95 83  BUN 58*   < > 70* 74* 80*  CREATININE 3.55*   < > 3.61* 3.71* 3.69*  CALCIUM 10.0   < > 9.5 9.6 9.4  PROT 6.7  --   --   --   --   ALBUMIN 3.7  --   --   --   --   AST 34  --   --   --   --   ALT 23  --   --   --   --   ALKPHOS 92  --   --   --   --   BILITOT 1.7*  --   --   --   --   GFRNONAA 14*   < > 14* 14* 14*  GFRAA 17*   < >  16* 16* 16*  ANIONGAP 12   < > 9 12 10    < > = values in this interval not displayed.     Hematology Recent Labs  Lab 10/27/18 0639 10/28/18 0014 10/29/18 0501  WBC 4.4 3.5* 3.5*  RBC 4.54 4.29 3.94*  HGB 13.4 12.4* 11.4*  HCT 40.5 37.2* 33.5*  MCV 89.2 86.7 85.0  MCH 29.5 28.9 28.9  MCHC 33.1 33.3 34.0  RDW 20.5* 20.2* 19.7*  PLT 134* 123* 122*    Cardiac Enzymes Recent Labs  Lab 10/27/18 0352 10/27/18 0639 10/27/18 1039 10/27/18 1732  TROPONINI 0.30* 0.33* 0.37* 0.33*     BNP Recent Labs  Lab 10/27/18 0352 10/27/18 0639  BNP >4,500.0* >4,500.0*     Radiology    No results found.  Telemetry    Sinus brady - Personally Reviewed  Cardiac Studies   Echocardiogram (10/28/2018): 1. The left ventricle has severely reduced systolic function, with an ejection fraction of 10-15%. The cavity size was normal. There is moderately increased left ventricular wall thickness. Left ventricular diastolic Doppler parameters are consistent  with impaired relaxation. Left ventricular diffuse hypokinesis. 2. The right ventricle has severely reduced systolic function. The cavity was moderately enlarged. There is no increase in right ventricular wall thickness. Right ventricular systolic pressure is severely elevated with an  estimated pressure of 70.4  mmHg. 3. Left atrial size was mildly dilated. 4. Right atrial size was moderately dilated. 5. Trivial pericardial effusion is present. 6. The mitral valve is degenerative. Mild thickening of the mitral valve leaflet. 7. Tricuspid valve regurgitation is moderate-severe. 8. The aortic valve is tricuspid. Severely thickening of the aortic valve. Sclerosis without any evidence of stenosis of the aortic valve. 9. The aortic root is normal in size and structure. 10. The inferior vena cava was dilated in size with <50% respiratory variability.  Patient Profile     85 y.o.malewith history of CAD status post PCI x2 (details unknown), chronic HFrEF, CKD (uncertain baseline creatinine), PAD status post right lower extremity bypass, chronic respiratory failure with hypoxia on supplemental oxygen, and hypertension, admitted with acute on chronic systolic and diastolic heart failure.  Assessment & Plan    1.  Acute on chronic systolic and diast CHF/ICM: EF 10-15% by echo this admission.  Given his age, DNR, palliative care and fact that his Cr is chronically over 3 do not think there is a role for inotropes in care of this patient Would not classify this as acute cardio renal syndrome but rather end stage heart and renal failure  2.  Acute on chronic resp failure w/ hypoxia:  In setting of above.  O2 down to 2.5 lpm (baseline is 2lpm @ home).  3.  Hypothyroidism:  TSH 117.6 this admission.  Cont levothyroxine.  Will need close outpt f/u and probably med adjustment.  ? Role in low output CHF/edema.  4.  Stage IV-V CKD:  As above, BUN/Creat slowly rising in the setting of diuresis.  Nephrology following. Stabilized this am around 3.7   Signed, Jenkins Rouge, MD  11/01/2018, 9:28 AM    For questions or updates, please contact   Please consult [email or web address] for contact info under Cardiology/STEMI.

## 2018-11-02 LAB — BASIC METABOLIC PANEL
Anion gap: 12 (ref 5–15)
BUN: 84 mg/dL — ABNORMAL HIGH (ref 8–23)
CO2: 25 mmol/L (ref 22–32)
Calcium: 9.6 mg/dL (ref 8.9–10.3)
Chloride: 100 mmol/L (ref 98–111)
Creatinine, Ser: 3.46 mg/dL — ABNORMAL HIGH (ref 0.61–1.24)
GFR calc Af Amer: 17 mL/min — ABNORMAL LOW (ref >60)
GFR calc non Af Amer: 15 mL/min — ABNORMAL LOW (ref >60)
Glucose, Bld: 86 mg/dL (ref 70–99)
Potassium: 3.6 mmol/L (ref 3.5–5.1)
Sodium: 137 mmol/L (ref 135–145)

## 2018-11-02 MED ORDER — ISOSORB DINITRATE-HYDRALAZINE 20-37.5 MG PO TABS: 1.0000 | ORAL_TABLET | Freq: Three times a day (TID) | ORAL | 0 refills | Status: DC

## 2018-11-02 MED ORDER — LEVOTHYROXINE SODIUM 25 MCG PO TABS: 25.0000 ug | ORAL_TABLET | Freq: Every day | ORAL | 0 refills | Status: DC

## 2018-11-02 NOTE — TOC Progression Note (Signed)
Transition of Care [facility]) - Progression Note    Patient Details  Name: Alex Larson MRN: [id number] Date of Birth: 01/22/1927  Transition of Care [facility]) CM/SW Contact  Latanya Maudlin, RN Phone Number: 11/02/2018, 10:20 AM  Clinical Narrative:  Patient continues to refuse home health stating that his daughter is able to assist with his care as needed. Patient is followed by outpatient palliative. Lives with daughter, has all needed DME.           Expected Discharge Plan and Services           Expected Discharge Date: 11/02/18                                     Social Determinants of Health (SDOH) Interventions    Readmission Risk Interventions No flowsheet data found.

## 2018-11-02 NOTE — Discharge Summary (Signed)
[facility] NAME: Alex Larson    MR#:  [id number]  DATE OF BIRTH:  December 05, 1926  DATE OF ADMISSION:  10/27/2018 ADMITTING PHYSICIAN: Sela Hua, MD  DATE OF DISCHARGE: 11/02/2018  PRIMARY CARE PHYSICIAN: Patient, No Pcp Per   ADMISSION DIAGNOSIS:  Acute CHF (congestive heart failure) ([name]) [I50.9] AKI (acute kidney injury) ([place]) [N17.9] Edema of right lower extremity [R60.0] Chest pain, unspecified type [R07.9] Acute on chronic congestive heart failure, unspecified heart failure type ([place]) [I50.9]  DISCHARGE DIAGNOSIS:  Active Problems:   Acute CHF (congestive heart failure) ([name])   Acute on chronic combined systolic and diastolic CHF (congestive heart failure) ([name])   Acute on chronic respiratory failure with hypoxia ([name])   Hypothyroidism   CAD in native artery   Chronic kidney disease   SECONDARY DIAGNOSIS:   Past Medical History:  Diagnosis Date  . CAD (coronary artery disease)   . CKD (chronic kidney disease)   . PAD (peripheral artery disease) ([place])      ADMITTING HISTORY Alex Larson  is a 85 y.o. male with a known history of CHF, coronary artery disease with stent placement x2, peripheral vascular disease with a history of right lower extremity bypass, and hypertension.  He tells me he recently relocated to the area living with his daughter currently.  His last residence was in [place] and he has not yet established primary care in the area.  He reports being out of his Lasix for 4 months.  he presented to the emergency room complaining of a 3-day history of intermittent midsternal chest pain with associated shortness of breath.  Chest pain described as burning with a pain score 4 out of 10 and nonradiating.  He had 2 episodes of nausea with vomiting on yesterday however he is uncertain whether or not these were associated with the chest pain.  He denies associated diaphoresis.  He denies history of MI.  He  has a history of chronic respiratory failure with oxygen at 2 L/min 24 hours a day.  He denies abdominal pain.  He denies fevers, chills, diarrhea.  He denies cough.  He denies a prior history of DVT.  However, he has a history of peripheral vascular disease with right lower extremity bypass in the past. Labs include troponin of 0.30.  BUN 58 and creatinine 3.55.  Chest x-ray demonstrates prominent interstitial markings suggestive of pulmonary edema.  COVID-19 testing is negative.    HOSPITAL COURSE:  Patient was admitted to telemetry.  Initially troponin was elevated patient was started on heparin drip.  Cardiology consultation was done in this hospitalization.  Patient was aggressively diuresed for heart failure exacerbation with IV Lasix 120 mg twice daily.  Nephrology followed the patient for acute on chronic kidney disease stage III.  Renal functions were closely monitored.  Considering advanced age no acute intervention recommended.  Elevated troponin secondary to demand ischemia.  Not on ACE inhibitor/ARB and Entresto secondary to renal failure.  Patient will continue aspirin and statin medication for coronary disease.  Patient was worked up with echocardiogram which showed EF of 10 to 15%.  Medical management recommended.  Patient overall diuresed well back to oxygen via nasal cannula to 2 L to 3 L at baseline.  Patient will be discharged home with home health services.  CONSULTS OBTAINED:  Treatment Team:  Nelva Bush, MD  DRUG ALLERGIES:  No Known Allergies  DISCHARGE MEDICATIONS:   Allergies as of 11/02/2018  No Known Allergies     Medication List    STOP taking these medications   isosorbide mononitrate 10 MG tablet Commonly known as:  ISMO     TAKE these medications   amLODipine 5 MG tablet Commonly known as:  NORVASC Take 5 mg by mouth daily. Notes to patient:  For blood pressure   carvedilol 6.25 MG tablet Commonly known as:  COREG Take 6.25 mg by mouth daily.    ferrous sulfate 220 (44 Fe) MG/5ML solution Take 220 mg by mouth daily.   furosemide 40 MG tablet Commonly known as:  LASIX Take 40 mg by mouth 2 (two) times daily.   indapamide 2.5 MG tablet Commonly known as:  LOZOL Take 2.5 mg by mouth daily. 30 minutes prior to furosemide   isosorbide-hydrALAZINE 20-37.5 MG tablet Commonly known as:  BIDIL Take 1 tablet by mouth 3 (three) times daily for 30 days.   levothyroxine 25 MCG tablet Commonly known as:  SYNTHROID Take 1 tablet (25 mcg total) by mouth daily before breakfast for 30 days. Start taking on:  November 03, 2018   multivitamin-iron-minerals-folic acid chewable tablet Chew 1 tablet by mouth daily.   pantoprazole 40 MG tablet Commonly known as:  PROTONIX Take 40 mg by mouth daily.       Today  Patient seen today No shortness of breath comfortable on oxygen via nasal cannula Hemodynamically stable  VITAL SIGNS:  Blood pressure 123/64, pulse 60, temperature (!) 97.5 F (36.4 C), temperature source Oral, resp. rate 20, height 6\' 2"  (1.88 m), weight 67.9 kg, SpO2 94 %.  I/O:    Intake/Output Summary (Last 24 hours) at 11/02/2018 1412 Last data filed at 11/02/2018 0854 Gross per 24 hour  Intake -  Output 2050 ml  Net -2050 ml    PHYSICAL EXAMINATION:  Physical Exam  GENERAL:  85 y.o.-year-old patient lying in the bed with no acute distress.  LUNGS: Normal breath sounds bilaterally, no wheezing, rales,rhonchi or crepitation. No use of accessory muscles of respiration.  CARDIOVASCULAR: S1, S2 normal. No murmurs, rubs, or gallops.  ABDOMEN: Soft, non-tender, non-distended. Bowel sounds present. No organomegaly or mass.  NEUROLOGIC: Moves all 4 extremities. PSYCHIATRIC: The patient is alert and oriented x 3.  SKIN: No obvious rash, lesion, or ulcer.   DATA REVIEW:   CBC Recent Labs  Lab 10/29/18 0501  WBC 3.5*  HGB 11.4*  HCT 33.5*  PLT 122*    Chemistries  Recent Labs  Lab 10/27/18 0352   10/28/18 0014  11/02/18 0554  NA 143  --  143   < > 137  K 4.2  --  4.5   < > 3.6  CL 115*  --  116*   < > 100  CO2 16*  --  18*   < > 25  GLUCOSE 107*  --  80   < > 86  BUN 58*  --  59*   < > 84*  CREATININE 3.55*   < > 3.35*   < > 3.46*  CALCIUM 10.0  --  9.4   < > 9.6  MG  --   --  1.8  --   --   AST 34  --   --   --   --   ALT 23  --   --   --   --   ALKPHOS 92  --   --   --   --   BILITOT 1.7*  --   --   --   --    < > =  values in this interval not displayed.    Cardiac Enzymes Recent Labs  Lab 10/27/18 1732  TROPONINI 0.33*    Microbiology Results  Results for orders placed or performed during the hospital encounter of 10/27/18  SARS Coronavirus 2 (CEPHEID - Performed in [facility] hospital lab), Hosp Order     Status: None   Collection Time: 10/27/18  3:52 AM  Result Value Ref Range Status   SARS Coronavirus 2 NEGATIVE NEGATIVE Final    Comment: (NOTE) If result is NEGATIVE SARS-CoV-2 target nucleic acids are NOT DETECTED. The SARS-CoV-2 RNA is generally detectable in upper and lower  respiratory specimens during the acute phase of infection. The lowest  concentration of SARS-CoV-2 viral copies this assay can detect is 250  copies / mL. A negative result does not preclude SARS-CoV-2 infection  and should not be used as the sole basis for treatment or other  patient management decisions.  A negative result may occur with  improper specimen collection / handling, submission of specimen other  than nasopharyngeal swab, presence of viral mutation(s) within the  areas targeted by this assay, and inadequate number of viral copies  (<250 copies / mL). A negative result must be combined with clinical  observations, patient history, and epidemiological information. If result is POSITIVE SARS-CoV-2 target nucleic acids are DETECTED. The SARS-CoV-2 RNA is generally detectable in upper and lower  respiratory specimens dur ing the acute phase of infection.  Positive   results are indicative of active infection with SARS-CoV-2.  Clinical  correlation with patient history and other diagnostic information is  necessary to determine patient infection status.  Positive results do  not rule out bacterial infection or co-infection with other viruses. If result is PRESUMPTIVE POSTIVE SARS-CoV-2 nucleic acids MAY BE PRESENT.   A presumptive positive result was obtained on the submitted specimen  and confirmed on repeat testing.  While 2019 novel coronavirus  (SARS-CoV-2) nucleic acids may be present in the submitted sample  additional confirmatory testing may be necessary for epidemiological  and / or clinical management purposes  to differentiate between  SARS-CoV-2 and other Sarbecovirus currently known to infect humans.  If clinically indicated additional testing with an alternate test  methodology [phone]) is advised. The SARS-CoV-2 RNA is generally  detectable in upper and lower respiratory sp ecimens during the acute  phase of infection. The expected result is Negative. Fact Sheet for Patients:  StrictlyIdeas.no Fact Sheet for Healthcare Providers: BankingDealers.co.za This test is not yet approved or cleared by the [place] FDA and has been authorized for detection and/or diagnosis of SARS-CoV-2 by FDA under an Emergency Use Authorization (EUA).  This EUA will remain in effect (meaning this test can be used) for the duration of the COVID-19 declaration under Section 564(b)(1) of the Act, 21 U.S.C. section 360bbb-3(b)(1), unless the authorization is terminated or revoked sooner. Performed at [facility], [address], [place], [place] [address]     RADIOLOGY:  No results found.  Follow up with PCP in 1 week.  Management plans discussed with the patient, family and they are in agreement.  CODE STATUS: DNR    Code Status Orders  (From admission, onward)         Start      Ordered   10/30/18 1445  Do not attempt resuscitation (DNR)  Continuous    Question Answer Comment  In the event of cardiac or respiratory ARREST Do not call a "code blue"   In the event  of cardiac or respiratory ARREST Do not perform Intubation, CPR, defibrillation or ACLS   In the event of cardiac or respiratory ARREST Use medication by any route, position, wound care, and other measures to relive pain and suffering. May use oxygen, suction and manual treatment of airway obstruction as needed for comfort.   Comments DNI. MOST form in chart.      10/30/18 1444        Code Status History    Date Active Date Inactive Code Status Order ID Comments User Context   10/27/2018 0530 10/30/2018 1444 Full Code [id number]  Mayer Camel, NP ED    Advance Directive Documentation     Most Recent Value  Type of Advance Directive  Healthcare Power of Attorney  Pre-existing out of facility DNR order (yellow form or pink MOST form)  -  "MOST" Form in Place?  -      TOTAL TIME TAKING CARE OF THIS PATIENT ON DAY OF DISCHARGE: more than 35 minutes.   Saundra Shelling M.D on 11/02/2018 at 2:12 PM  Between 7am to 6pm - Pager - [phone]  After 6pm go to [email or web address] - password [email or web address]  Office  [phone]  CC: Primary care physician; Patient, No Pcp Per  Note: This dictation was prepared with Dragon dictation along with smaller phrase technology. Any transcriptional errors that result from this process are unintentional.

## 2018-11-02 NOTE — Progress Notes (Signed)
Progress Note  Patient Name: Alex Larson Date of Encounter: 11/02/2018  Primary Cardiologist: Nelva Bush, MD  Subjective   Seems to be breathing better  Inpatient Medications    Scheduled Meds:  aspirin EC  81 mg Oral Daily   atorvastatin  40 mg Oral q1800   enoxaparin (LOVENOX) injection  30 mg Subcutaneous Q24H   feeding supplement (ENSURE ENLIVE)  237 mL Oral BID BM   ferrous sulfate  325 mg Oral Daily   folic acid  1 mg Oral Daily   furosemide  80 mg Oral BID   isosorbide-hydrALAZINE  1 tablet Oral TID   levothyroxine  25 mcg Oral QAC breakfast   multivitamin with minerals  1 tablet Oral Daily   pantoprazole  40 mg Oral Daily   sodium chloride flush  3 mL Intravenous Q12H   Continuous Infusions:  sodium chloride     PRN Meds: sodium chloride, acetaminophen, ondansetron (ZOFRAN) IV, sodium chloride flush   Vital Signs    Vitals:   11/01/18 1602 11/01/18 1934 11/02/18 0601 11/02/18 0604  BP: 102/64 (!) 148/72 123/64   Pulse: (!) 59 74 60   Resp:  20 20   Temp: 97.6 F (36.4 C) 97.7 F (36.5 C) (!) 97.5 F (36.4 C)   TempSrc: Oral Oral Oral   SpO2: 95% 94% 94%   Weight:    67.9 kg  Height:        Intake/Output Summary (Last 24 hours) at 11/02/2018 0956 Last data filed at 11/02/2018 0854 Gross per 24 hour  Intake --  Output 2450 ml  Net -2450 ml   Filed Weights   10/31/18 0510 11/01/18 0531 11/02/18 0604  Weight: 72.6 kg 69.5 kg 67.9 kg    Physical Exam   GEN: Well nourished, well developed, in no acute distress.  HEENT: Grossly normal.  Neck: Supple, JVD to jaw, no carotid bruits, or masses. Cardiac: RRR, 2/6 syst murmur throughout, no rubs, or gallops. No clubbing, cyanosis, 2+ bilat LE edema.  Radials/DP/PT 1+ and equal bilaterally.  Respiratory:  Respirations regular and unlabored, bibasilar crackles. GI: Soft, nontender, nondistended, BS + x 4. MS: no deformity or atrophy. Skin: warm and dry, no rash. Neuro:   Strength and sensation are intact. Psych: AAOx3.  Normal affect.  Labs    Chemistry Recent Labs  Lab 10/27/18 0352  10/31/18 0305 11/01/18 0506 11/02/18 0554  NA 143   < > 141 138 137  K 4.2   < > 3.7 3.6 3.6  CL 115*   < > 108 104 100  CO2 16*   < > 21* 24 25  GLUCOSE 107*   < > 95 83 86  BUN 58*   < > 74* 80* 84*  CREATININE 3.55*   < > 3.71* 3.69* 3.46*  CALCIUM 10.0   < > 9.6 9.4 9.6  PROT 6.7  --   --   --   --   ALBUMIN 3.7  --   --   --   --   AST 34  --   --   --   --   ALT 23  --   --   --   --   ALKPHOS 92  --   --   --   --   BILITOT 1.7*  --   --   --   --   GFRNONAA 14*   < > 14* 14* 15*  GFRAA 17*   < > 16* 16*  17*  ANIONGAP 12   < > 12 10 12    < > = values in this interval not displayed.     Hematology Recent Labs  Lab 10/27/18 0639 10/28/18 0014 10/29/18 0501  WBC 4.4 3.5* 3.5*  RBC 4.54 4.29 3.94*  HGB 13.4 12.4* 11.4*  HCT 40.5 37.2* 33.5*  MCV 89.2 86.7 85.0  MCH 29.5 28.9 28.9  MCHC 33.1 33.3 34.0  RDW 20.5* 20.2* 19.7*  PLT 134* 123* 122*    Cardiac Enzymes Recent Labs  Lab 10/27/18 0352 10/27/18 0639 10/27/18 1039 10/27/18 1732  TROPONINI 0.30* 0.33* 0.37* 0.33*     BNP Recent Labs  Lab 10/27/18 0352 10/27/18 0639  BNP >4,500.0* >4,500.0*     Radiology    No results found.  Telemetry    Sinus brady - Personally Reviewed  Cardiac Studies   Echocardiogram (10/28/2018): 1. The left ventricle has severely reduced systolic function, with an ejection fraction of 10-15%. The cavity size was normal. There is moderately increased left ventricular wall thickness. Left ventricular diastolic Doppler parameters are consistent  with impaired relaxation. Left ventricular diffuse hypokinesis. 2. The right ventricle has severely reduced systolic function. The cavity was moderately enlarged. There is no increase in right ventricular wall thickness. Right ventricular systolic pressure is severely elevated with an estimated pressure  of 70.4  mmHg. 3. Left atrial size was mildly dilated. 4. Right atrial size was moderately dilated. 5. Trivial pericardial effusion is present. 6. The mitral valve is degenerative. Mild thickening of the mitral valve leaflet. 7. Tricuspid valve regurgitation is moderate-severe. 8. The aortic valve is tricuspid. Severely thickening of the aortic valve. Sclerosis without any evidence of stenosis of the aortic valve. 9. The aortic root is normal in size and structure. 10. The inferior vena cava was dilated in size with <50% respiratory variability.  Patient Profile     85 y.o.malewith history of CAD status post PCI x2 (details unknown), chronic HFrEF, CKD (uncertain baseline creatinine), PAD status post right lower extremity bypass, chronic respiratory failure with hypoxia on supplemental oxygen, and hypertension, admitted with acute on chronic systolic and diastolic heart failure.  Assessment & Plan    1.  Acute on chronic systolic and diast CHF/ICM: EF 10-15% by echo this admission.  Given his age, DNR, palliative care and fact that his Cr is chronically over 3 do not think there is a role for inotropes in care of this patient Would not classify this as acute cardio renal syndrome but rather end stage heart and renal failure Diuresing well with improved symptoms and Cr stable in mid 3 range   2.  Acute on chronic resp failure w/ hypoxia:  In setting of above.  O2 down to 2.5 lpm (baseline is 2lpm @ home).  3.  Hypothyroidism:  TSH 117.6 this admission.  Cont levothyroxine.  Will need close outpt f/u and probably med adjustment.  ? Role in low output CHF/edema.  4.  Stage IV-V CKD:  As above, BUN/Creat slowly rising in the setting of diuresis.  Nephrology following. Stabilized this am around 3.46   Signed, Jenkins Rouge, MD  11/02/2018, 9:56 AM    For questions or updates, please contact   Please consult [email or web address] for contact info under Cardiology/STEMI.

## 2018-11-02 NOTE — Progress Notes (Signed)
[facility]  ROUNDING NOTE   Subjective:   UOP 2229mL  Furosemide 80mg  PO bid  Objective:  Vital signs in last 24 hours:  Temp:  [97.5 F (36.4 C)-97.7 F (36.5 C)] 97.5 F (36.4 C) (05/31 0601) Pulse Rate:  [59-74] 60 (05/31 0601) Resp:  [20] 20 (05/31 0601) BP: (102-148)/(64-72) 123/64 (05/31 0601) SpO2:  [94 %-95 %] 94 % (05/31 0601) Weight:  [67.9 kg] 67.9 kg (05/31 0604)  Weight change: -1.633 kg Filed Weights   10/31/18 0510 11/01/18 0531 11/02/18 0604  Weight: 72.6 kg 69.5 kg 67.9 kg    Intake/Output: I/O last 3 completed shifts: In: -  Out: 4500 [Urine:4500]   Intake/Output this shift:  Total I/O In: -  Out: 400 [Urine:400]  Physical Exam: General: NAD, laying in bed  Head: Normocephalic, atraumatic. Moist oral mucosal membranes  Eyes: Anicteric, PERRL  Neck: Supple, trachea midline  Lungs:  Clear to auscultation  Heart: Regular rate and rhythm  Abdomen:  Soft, nontender,   Extremities: trace peripheral edema.  Neurologic: Nonfocal, moving all four extremities  Skin: No lesions        Basic Metabolic Panel: Recent Labs  Lab 10/28/18 0014 10/29/18 0501 10/30/18 0319 10/31/18 0305 11/01/18 0506 11/02/18 0554  NA 143 140 140 141 138 137  K 4.5 4.0 3.8 3.7 3.6 3.6  CL 116* 113* 112* 108 104 100  CO2 18* 19* 19* 21* 24 25  GLUCOSE 80 86 101* 95 83 86  BUN 59* 64* 70* 74* 80* 84*  CREATININE 3.35* 3.52* 3.61* 3.71* 3.69* 3.46*  CALCIUM 9.4 9.2 9.5 9.6 9.4 9.6  MG 1.8  --   --   --   --   --     Liver Function Tests: Recent Labs  Lab 10/27/18 0352  AST 34  ALT 23  ALKPHOS 92  BILITOT 1.7*  PROT 6.7  ALBUMIN 3.7   No results for input(s): LIPASE, AMYLASE in the last 168 hours. No results for input(s): AMMONIA in the last 168 hours.  CBC: Recent Labs  Lab 10/27/18 0352 10/27/18 0639 10/28/18 0014 10/29/18 0501  WBC 6.0 4.4 3.5* 3.5*  NEUTROABS 4.9  --   --   --   HGB 13.8 13.4 12.4* 11.4*  HCT 42.9 40.5 37.2*  33.5*  MCV 89.4 89.2 86.7 85.0  PLT 140* 134* 123* 122*    Cardiac Enzymes: Recent Labs  Lab 10/27/18 0352 10/27/18 0639 10/27/18 1039 10/27/18 1732  TROPONINI 0.30* 0.33* 0.37* 0.33*    BNP: Invalid input(s): POCBNP  CBG: No results for input(s): GLUCAP in the last 168 hours.  Microbiology: Results for orders placed or performed during the hospital encounter of 10/27/18  SARS Coronavirus 2 (CEPHEID - Performed in [facility] hospital lab), Hosp Order     Status: None   Collection Time: 10/27/18  3:52 AM  Result Value Ref Range Status   SARS Coronavirus 2 NEGATIVE NEGATIVE Final    Comment: (NOTE) If result is NEGATIVE SARS-CoV-2 target nucleic acids are NOT DETECTED. The SARS-CoV-2 RNA is generally detectable in upper and lower  respiratory specimens during the acute phase of infection. The lowest  concentration of SARS-CoV-2 viral copies this assay can detect is 250  copies / mL. A negative result does not preclude SARS-CoV-2 infection  and should not be used as the sole basis for treatment or other  patient management decisions.  A negative result may occur with  improper specimen collection / handling, submission of specimen  other  than nasopharyngeal swab, presence of viral mutation(s) within the  areas targeted by this assay, and inadequate number of viral copies  (<250 copies / mL). A negative result must be combined with clinical  observations, patient history, and epidemiological information. If result is POSITIVE SARS-CoV-2 target nucleic acids are DETECTED. The SARS-CoV-2 RNA is generally detectable in upper and lower  respiratory specimens dur ing the acute phase of infection.  Positive  results are indicative of active infection with SARS-CoV-2.  Clinical  correlation with patient history and other diagnostic information is  necessary to determine patient infection status.  Positive results do  not rule out bacterial infection or co-infection with other  viruses. If result is PRESUMPTIVE POSTIVE SARS-CoV-2 nucleic acids MAY BE PRESENT.   A presumptive positive result was obtained on the submitted specimen  and confirmed on repeat testing.  While 2019 novel coronavirus  (SARS-CoV-2) nucleic acids may be present in the submitted sample  additional confirmatory testing may be necessary for epidemiological  and / or clinical management purposes  to differentiate between  SARS-CoV-2 and other Sarbecovirus currently known to infect humans.  If clinically indicated additional testing with an alternate test  methodology [phone]) is advised. The SARS-CoV-2 RNA is generally  detectable in upper and lower respiratory sp ecimens during the acute  phase of infection. The expected result is Negative. Fact Sheet for Patients:  StrictlyIdeas.no Fact Sheet for Healthcare Providers: BankingDealers.co.za This test is not yet approved or cleared by the [place] FDA and has been authorized for detection and/or diagnosis of SARS-CoV-2 by FDA under an Emergency Use Authorization (EUA).  This EUA will remain in effect (meaning this test can be used) for the duration of the COVID-19 declaration under Section 564(b)(1) of the Act, 21 U.S.C. section 360bbb-3(b)(1), unless the authorization is terminated or revoked sooner. Performed at [facility], [address] [place], [place] [address]     Coagulation Studies: No results for input(s): LABPROT, INR in the last 72 hours.  Urinalysis: No results for input(s): COLORURINE, LABSPEC, PHURINE, GLUCOSEU, HGBUR, BILIRUBINUR, KETONESUR, PROTEINUR, UROBILINOGEN, NITRITE, LEUKOCYTESUR in the last 72 hours.  Invalid input(s): APPERANCEUR    Imaging: No results found.   Medications:   . sodium chloride     . aspirin EC  81 mg Oral Daily  . atorvastatin  40 mg Oral q1800  . enoxaparin (LOVENOX) injection  30 mg Subcutaneous Q24H  . feeding  supplement (ENSURE ENLIVE)  237 mL Oral BID BM  . ferrous sulfate  325 mg Oral Daily  . folic acid  1 mg Oral Daily  . furosemide  80 mg Oral BID  . isosorbide-hydrALAZINE  1 tablet Oral TID  . levothyroxine  25 mcg Oral QAC breakfast  . multivitamin with minerals  1 tablet Oral Daily  . pantoprazole  40 mg Oral Daily  . sodium chloride flush  3 mL Intravenous Q12H   sodium chloride, acetaminophen, ondansetron (ZOFRAN) IV, sodium chloride flush  Assessment/ Plan:  Mr. Alex Larson is a 85 y.o. black male with congestive heart failure, coronary artery disease, peripheral vascular disease, anemiawho was [facility]9449 for Acute systolic congestive heart failure  1. Acute renal failure: with chronic kidney disease stage IV. Baseline creatinine is 3.7, GFR of 19 on 09/11/2018 No proteinuria, urinalysis with hematuria.  Acute renal failure secondary to acute cardiorenal syndrome Nonoliguric urine output  2. Acute exacerbation of systolic congestive heart failure  3. Hypertension  Discharge on furosemide  80mg  bid. PO   LOS: 6 Alex Larson 5/31/20203:02 PM

## 2018-11-02 NOTE — Plan of Care (Signed)
  Problem: Education: Goal: Ability to demonstrate management of disease process will improve Outcome: Progressing Goal: Ability to verbalize understanding of medication therapies will improve Outcome: Progressing   Problem: Education: Goal: Individualized Educational Video(s) Outcome: Adequate for Discharge   Problem: Activity: Goal: Capacity to carry out activities will improve Outcome: Adequate for Discharge   Problem: Cardiac: Goal: Ability to achieve and maintain adequate cardiopulmonary perfusion will improve Outcome: Adequate for Discharge   Problem: Education: Goal: Knowledge of General Education information will improve Description Including pain rating scale, medication(s)/side effects and non-pharmacologic comfort measures Outcome: Adequate for Discharge   Problem: Health Behavior/Discharge Planning: Goal: Ability to manage health-related needs will improve Outcome: Adequate for Discharge   Problem: Clinical Measurements: Goal: Ability to maintain clinical measurements within normal limits will improve Outcome: Adequate for Discharge Goal: Will remain free from infection Outcome: Adequate for Discharge Goal: Diagnostic test results will improve Outcome: Adequate for Discharge Goal: Respiratory complications will improve Outcome: Adequate for Discharge Goal: Cardiovascular complication will be avoided Outcome: Adequate for Discharge   Problem: Activity: Goal: Risk for activity intolerance will decrease Outcome: Adequate for Discharge   Problem: Safety: Goal: Ability to remain free from injury will improve Outcome: Adequate for Discharge   Problem: Skin Integrity: Goal: Risk for impaired skin integrity will decrease Outcome: Adequate for Discharge

## 2018-11-04 ENCOUNTER — Ambulatory Visit: Payer: Medicare Other | Attending: Family | Admitting: Family

## 2018-11-04 ENCOUNTER — Telehealth: Payer: Self-pay

## 2018-11-04 ENCOUNTER — Other Ambulatory Visit: Payer: Self-pay

## 2018-11-04 DIAGNOSIS — I10 Essential (primary) hypertension: Secondary | ICD-10-CM

## 2018-11-04 DIAGNOSIS — N184 Chronic kidney disease, stage 4 (severe): Secondary | ICD-10-CM

## 2018-11-04 DIAGNOSIS — I5022 Chronic systolic (congestive) heart failure: Secondary | ICD-10-CM

## 2018-11-04 MED ORDER — AMLODIPINE BESYLATE 5 MG PO TABS: 5.0000 mg | ORAL_TABLET | Freq: Every day | ORAL | 5 refills | Status: DC

## 2018-11-04 MED ORDER — ISOSORBIDE MONONITRATE 10 MG PO TABS: 10.0000 mg | ORAL_TABLET | Freq: Every day | ORAL | 5 refills | Status: DC

## 2018-11-04 MED ORDER — INDAPAMIDE 2.5 MG PO TABS: 2.5000 mg | ORAL_TABLET | Freq: Every day | ORAL | 5 refills | Status: DC

## 2018-11-04 MED ORDER — FUROSEMIDE 40 MG PO TABS: 40.0000 mg | ORAL_TABLET | Freq: Two times a day (BID) | ORAL | 5 refills | Status: DC

## 2018-11-04 MED ORDER — PANTOPRAZOLE SODIUM 40 MG PO TBEC: 40.0000 mg | DELAYED_RELEASE_TABLET | Freq: Every day | ORAL | 5 refills | Status: DC

## 2018-11-04 MED ORDER — CARVEDILOL 3.125 MG PO TABS: 3.1250 mg | ORAL_TABLET | Freq: Two times a day (BID) | ORAL | 5 refills | Status: DC

## 2018-11-04 NOTE — Patient Instructions (Signed)
Begin weighing daily and call for an overnight weight gain of > 2 pounds or a weekly weight gain of >5 pounds. 

## 2018-11-04 NOTE — Progress Notes (Signed)
Virtual Visit via Telephone Note    Evaluation Performed:  Initial visit  This visit type was conducted due to national recommendations for restrictions regarding the COVID-19 Pandemic (e.g. social distancing).  This format is felt to be most appropriate for this patient at this time.  All issues noted in this document were discussed and addressed.  No physical exam was performed (except for noted visual exam findings with Video Visits).  Please refer to the patient's chart (MyChart message for video visits and phone note for telephone visits) for the patient's consent to telehealth for [facility]  Date:  11/04/2018   ID:  Alex Larson, DOB 31-Mar-1927, MRN [id number]  Patient Location:  [place] [address]   Provider location:   [facility] [place] [address], [place] [address]  PCP:  Patient, No Pcp Per  Cardiologist:  Nelva Bush, MD  Electrophysiologist:  None   Chief Complaint:  None  History of Present Illness:    Alex Larson is a 85 y.o. male who presents via audio/video conferencing for a telehealth visit today.  Patient verified DOB and address.  The patient does not have symptoms concerning for COVID-19 infection (fever, chills, cough, or new SHORTNESS OF BREATH).   Patient currently has no health complaints and specifically denies any dizziness, swelling in his legs/ abdomen, palpitations, chest pain, shortness of breath, difficulty sleeping or fatigue. Recently moved from [place] and doesn't have any scales although his daughter says that she is going to pick up a set of scales for him. Does request refills on his medications until he gets established with a PCP. Wears oxygen at 2L around the clock. Daughter is concerned because he was listed as a DNR and came home with a MOST form and patient says that he really didn't understand what a DNR was and she would like it removed from his medical record.   Prior  CV studies:   The following studies were reviewed today:  Echo report from 10/28/2018 reviewed and showed an EF of 10-15% along with an elevated PA pressure of 70.4 mm Hg and moderate/ severe TR.   Past Medical History:  Diagnosis Date  . CAD (coronary artery disease)   . CKD (chronic kidney disease)   . PAD (peripheral artery disease) ([name])    No past surgical history on file.   Current Meds  Medication Sig  . amLODipine (NORVASC) 5 MG tablet Take 5 mg by mouth daily.  . carvedilol (COREG) 6.25 MG tablet Take 6.25 mg by mouth daily.   . ferrous sulfate 220 (44 Fe) MG/5ML solution Take 220 mg by mouth daily.  . furosemide (LASIX) 40 MG tablet Take 40 mg by mouth 2 (two) times daily.  . indapamide (LOZOL) 2.5 MG tablet Take 2.5 mg by mouth daily. 30 minutes prior to furosemide  . multivitamin-iron-minerals-folic acid (CENTRUM) chewable tablet Chew 1 tablet by mouth daily.  . pantoprazole (PROTONIX) 40 MG tablet Take 40 mg by mouth daily.     Allergies:   Patient has no known allergies.   Social History   Tobacco Use  . Smoking status: Former Research scientist (life sciences)  . Smokeless tobacco: Former Network engineer Use Topics  . Alcohol use: Not Currently  . Drug use: Never     Family Hx: The patient's family history is not on file.  ROS:   Please see the history of present illness.     All other systems reviewed and are  negative.   Labs/Other Tests and Data Reviewed:    Recent Labs: 10/27/2018: ALT 23; B Natriuretic Peptide >4,500.0 10/28/2018: Magnesium 1.8; TSH 117.652 10/29/2018: Hemoglobin 11.4; Platelets 122 11/02/2018: BUN 84; Creatinine, Ser 3.46; Potassium 3.6; Sodium 137   Recent Lipid Panel Lab Results  Component Value Date/Time   CHOL 240 (H) 10/27/2018 06:39 AM   TRIG 117 10/27/2018 06:39 AM   HDL 45 10/27/2018 06:39 AM   CHOLHDL 5.3 10/27/2018 06:39 AM   LDLCALC 172 (H) 10/27/2018 06:39 AM    Wt Readings from Last 3 Encounters:  11/02/18 149 lb 9.6 oz (67.9 kg)      Exam:    Vital Signs:  There were no vitals taken for this visit.   Well nourished, well developed male in no  acute distress.   ASSESSMENT & PLAN:    1. Chronic heart failure with reduced ejection fraction- - NYHA class I - euvolemic per patient's description of symptoms - not weighing daily as he doesn't have scales as he recently moved here from [place]. Daughter that is also on the call says that she will get scales for him.  - instructed to weigh every morning and call for an overnight weight gain of >2 pounds or a weekly weight gain of >5 pounds - not adding salt and is trying to eat low sodium foods - currently taking carvedilol 6.25mg  daily so will change it to 3.125 mg BID - patient currently asymptomatic so will not add entresto - daughter has to call cardiology for an appointment - BNP 10/27/2018 was >4500.0  2: HTN- - not checking BP at home - needs PCP and patient and daughter are going to discuss this with Dr. Juleen China - BMP from 11/02/2018 reviewed and showed sodium 137, potassium 3.6, creatinine 3.46 and GFR 17   3: CKD- - has appointment with nephrology ([place]) 11/13/2018  Daughter wants to speak with someone regarding getting his DNR paperwork removed from his hospital record as she says that he didn't realize what all that meant. Will call patient experience office and see if they can help the family. Daughter appreciative of that.   COVID-19 Education: The signs and symptoms of COVID-19 were discussed with the patient and how to seek care for testing (follow up with PCP or arrange E-visit).  The importance of social distancing was discussed today.  Patient Risk:   After full review of this patients clinical status, I feel that they are at least moderate risk at this time.  Time:   Today, I have spent 17 minutes with the patient with telehealth technology discussing medications, weight and symptoms to report.     Medication Adjustments/Labs and Tests  Ordered: Current medicines are reviewed at length with the patient today.  Concerns regarding medicines are outlined above.   Tests Ordered: No orders of the defined types were placed in this encounter.  Medication Changes: No orders of the defined types were placed in this encounter.   Disposition:  Follow-up in 6 weeks or sooner for any questions/problems before then.   Signed, Alisa Graff, FNP  11/04/2018 11:59 AM    [facility]

## 2018-11-04 NOTE — Telephone Encounter (Signed)
TELEPHONE CALL NOTE  Alex Larson has been deemed a candidate for a follow-up tele-health visit to limit community exposure during the Covid-19 pandemic. I spoke with the patient via phone to ensure availability of phone/video source, confirm preferred email & phone number, discuss instructions and expectations, and review consent.   I reminded Alex Larson to be prepared with any vital sign and/or heart rhythm information that could potentially be obtained via home monitoring, at the time of his visit.  Finally, I reminded Alex Larson to expect an e-mail containing a link for their video-based visit approximately 15 minutes before his visit, or alternatively, a phone call at the time of his visit if his visit is planned to be a phone encounter.  Did the patient verbally consent to treatment as below? YES  Alex Larson, CMA 11/04/2018 10:23 AM  CONSENT FOR TELE-HEALTH VISIT - PLEASE REVIEW  I hereby voluntarily request, consent and authorize [facility] and its employed or contracted physicians, physician assistants, nurse practitioners or other licensed health care professionals (the Practitioner), to provide me with telemedicine health care services (the "Services") as deemed necessary by the treating Practitioner. I acknowledge and consent to receive the Services by the Practitioner via telemedicine. I understand that the telemedicine visit will involve communicating with the Practitioner through telephonic communication technology and the disclosure of certain medical information by electronic transmission. I acknowledge that I have been given the opportunity to request an in-person assessment or other available alternative prior to the telemedicine visit and am voluntarily participating in the telemedicine visit.  I understand that I have the right to withhold or withdraw my consent to the use of telemedicine in the course of my care at any time, without affecting  my right to future care or treatment, and that the Practitioner or I may terminate the telemedicine visit at any time. I understand that I have the right to inspect all information obtained and/or recorded in the course of the telemedicine visit and may receive copies of available information for a reasonable fee.  I understand that some of the potential risks of receiving the Services via telemedicine include:  Marland Kitchen Delay or interruption in medical evaluation due to technological equipment failure or disruption; . Information transmitted may not be sufficient (e.g. poor resolution of images) to allow for appropriate medical decision making by the Practitioner; and/or  . In rare instances, security protocols could fail, causing a breach of personal health information.  Furthermore, I acknowledge that it is my responsibility to provide information about my medical history, conditions and care that is complete and accurate to the best of my ability. I acknowledge that Practitioner's advice, recommendations, and/or decision may be based on factors not within their control, such as incomplete or inaccurate data provided by me or lack of visual representation. I understand that the practice of medicine is not an exact science and that Practitioner makes no warranties or guarantees regarding treatment outcomes. I acknowledge that I will receive a copy of this consent concurrently upon execution via email to the email address I last provided but may also request a printed copy by calling the office of [facility].    I understand that my insurance may be billed for this visit.   I have read or had this consent read to me. . I understand the contents of this consent, which adequately explains the benefits and risks of the Services being provided via telemedicine.  . I have been  provided ample opportunity to ask questions regarding this consent and the Services and have had my questions answered to my  satisfaction. . I give my informed consent for the services to be provided through the use of telemedicine in my medical care  By participating in this telemedicine visit I agree to the above.

## 2018-11-04 NOTE — Telephone Encounter (Signed)
   TELEPHONE CALL NOTE  This patient has been deemed a candidate for follow-up tele-health visit to limit community exposure during the Covid-19 pandemic. I spoke with the patient via phone to discuss instructions. The patient was advised to review the section on consent for treatment as well. The patient will receive a phone call 2-3 days prior to their E-Visit at which time consent will be verbally confirmed. A Virtual Office Visit appointment type has been scheduled for 11/04/2018 with Darylene Price FNP.  Vonda Antigua L, CMA 11/04/2018 10:22 AM

## 2018-11-10 ENCOUNTER — Telehealth: Payer: Self-pay | Admitting: Student

## 2018-11-10 NOTE — Telephone Encounter (Signed)
Spoke with patient's daughter [name] and have scheduled a Telephone Palliative Consult for 11/12/18 @ 2 PM.

## 2018-11-12 ENCOUNTER — Other Ambulatory Visit: Payer: Self-pay | Admitting: Student

## 2018-11-12 ENCOUNTER — Other Ambulatory Visit: Payer: Self-pay

## 2018-11-12 DIAGNOSIS — Z515 Encounter for palliative care: Secondary | ICD-10-CM

## 2018-11-12 NOTE — Progress Notes (Signed)
[facility] Consult Note Telephone: [phone]  Fax: [phone]  PATIENT NAME: Alex Larson DOB: 02-24-1927 MRN: [id number]  PRIMARY CARE PROVIDER:   Patient, No Pcp Per  REFERRING PROVIDER:  Alisa Graff, [address] [place], [place] [address]  RESPONSIBLE PARTY: Daughter, Georgia Lopes [phone]  ASSESSMENT: Due to the COVID-19 crisis, this visit was done via telemedicine and it was initiated and consent by this patient and or family. Visit conducted via telephone. Palliative NP explained role of Palliative Medicine. We discussed ongoing goals of care. We discussed symptom management. We discussed advanced care planning. DNR was reviewed and MOST form filled out during visit. Mr. Faria would like to have CPR performed and is a FULL CODE. Daughter was advised to notify HF clinic due to reported weight gain per their parameters; she verbalizes understanding.       RECOMMENDATIONS and PLAN:  1. Code status: Full Code; MOST form reviewed and filled out during visit.  2. Medical goals of therapy: Goal is for comfort; limit ER visits. Follow up at HF clinic as scheduled. Palliative Medicine will make recommendations as needed. 3. Symptom management: shortness of breath: continue oxygen at 2 liters per minute. Weight gain: continue indapamide 2.5mg  daily and furosemide 40mg  BID as directed; patient/daughter to notify HF of weight gain per set parameters.   Palliative Medicine will follow up in 8 weeks or sooner, if needed.  I spent 45 minutes providing this consultation,  from 2:00pm to 2:45pm. More than 50% of the time in this consultation was spent coordinating communication.   HISTORY OF PRESENT ILLNESS:  Alex Larson is a 86 y.o.  male with multiple medical problems including CAD, CKD, PAD, hypertension. Palliative Care was asked to help address goals of care. Mr. Hollett presently resides with his  daughter. He recently moved to [place] from [place] after his wife passed away. He states he has been doing okay since discharging from the hospital. He daughter states they have been weighing patient, but he did not obtain his prescription for his furosemide until today. His weight today is 159 pounds; daughter is advised to contact HF clinic due to weight gain since discharging from hospital. His appetite has improved and he has been eating well. He is sleeping well at night. He denies pain, nausea or constipation. He does report dyspnea with exertion. He has oxygen ordered continuously at 2 liters per minute but only has a concentrator, no portable tanks. He presently does not have a PCP. He has an appointment with Dr. Rolly Salter tomorrow. Daughter is going to ask for recommendations for PCP. Daughter states that they discussed her being patient's HCPOA while he was in the hospital.       CODE STATUS: Full Code  PPS: 0% HOSPICE ELIGIBILITY/DIAGNOSIS: TBD  PAST MEDICAL HISTORY:  Past Medical History:  Diagnosis Date  . CAD (coronary artery disease)   . CKD (chronic kidney disease)   . PAD (peripheral artery disease) ([name])     SOCIAL HX:  Social History   Tobacco Use  . Smoking status: Former Research scientist (life sciences)  . Smokeless tobacco: Former Network engineer Use Topics  . Alcohol use: Not Currently    ALLERGIES: No Known Allergies   PERTINENT MEDICATIONS:  Outpatient Encounter Medications as of 11/12/2018  Medication Sig  . amLODipine (NORVASC) 5 MG tablet Take 1 tablet (5 mg total) by mouth daily.  . carvedilol (COREG) 3.125 MG tablet Take 1 tablet (3.125  mg total) by mouth 2 (two) times daily with a meal.  . ferrous sulfate 220 (44 Fe) MG/5ML solution Take 220 mg by mouth daily.  . furosemide (LASIX) 40 MG tablet Take 1 tablet (40 mg total) by mouth 2 (two) times daily.  . indapamide (LOZOL) 2.5 MG tablet Take 1 tablet (2.5 mg total) by mouth daily. 30 minutes prior to furosemide  .  isosorbide mononitrate (ISMO) 10 MG tablet Take 1 tablet (10 mg total) by mouth daily.  . multivitamin-iron-minerals-folic acid (CENTRUM) chewable tablet Chew 1 tablet by mouth daily.  . pantoprazole (PROTONIX) 40 MG tablet Take 1 tablet (40 mg total) by mouth daily.   No facility-administered encounter medications on file as of 11/12/2018.     PHYSICAL EXAM:   Physical exam deferred.   Ezekiel Slocumb, NP

## 2018-11-13 ENCOUNTER — Other Ambulatory Visit: Payer: Self-pay

## 2018-11-14 ENCOUNTER — Telehealth: Payer: Self-pay

## 2018-11-14 NOTE — Telephone Encounter (Signed)
In general I am not accepting new patients though for family members of current patients I do typically make exceptions. I am happy to take him on as a patient, though please let her know that it could take some time to get him scheduled for a new patient visit given how full my schedule is currently.

## 2018-11-14 NOTE — Telephone Encounter (Signed)
Copied from [place] [id number]. Topic: Appointment Scheduling - Prior Auth Required for Appointment >> Nov 14, 2018  3:14 PM Selinda Flavin B, [place] wrote: No appointment has been scheduled. Patient's daughter, Alex Larson, is requesting New Patient appointment. Per scheduling protocol, this appointment requires a prior authorization prior to scheduling.  Advised patient's daughter, Alex Larson one of Dr Ellen Henri patient's, that he is not currently accepting new patients. Daughter very addiment that a message be sent to Dr Caryl Bis about the possibility of him accepting her father as a new patient. States that patient's Cardiologist gave her a paper that had a list of providers accepting new patients and she states that Dr Caryl Bis was on that list. Please advise.  CB#: [phone]  Route to department's PEC pool.-

## 2018-11-17 ENCOUNTER — Telehealth: Payer: Self-pay

## 2018-11-17 NOTE — Telephone Encounter (Signed)
Please see the other phone note from today.

## 2018-11-17 NOTE — Telephone Encounter (Signed)
Copied from [place] [id number]. Topic: Appointment Scheduling - Prior Auth Required for Appointment >> Nov 14, 2018  3:14 PM Selinda Flavin B, [place] wrote: No appointment has been scheduled. Patient's daughter, Georgia Lopes, is requesting New Patient appointment. Per scheduling protocol, this appointment requires a prior authorization prior to scheduling.  Advised patient's daughter, Georgia Lopes one of Dr Ellen Henri patient's, that he is not currently accepting new patients. Daughter very addiment that a message be sent to Dr Caryl Bis about the possibility of him accepting her father as a new patient. States that patient's Cardiologist gave her a paper that had a list of providers accepting new patients and she states that Dr Caryl Bis was on that list. Please advise.  CB#: [phone]  Route to department's PEC pool.-

## 2018-11-17 NOTE — Telephone Encounter (Signed)
Called and left a message for pt to call back to schedule a new pt appointment with Dr. Caryl Bis with his permission. Alex Larson,cma

## 2018-11-17 NOTE — Telephone Encounter (Signed)
Called the patient's daughter and informed her that Dr. Caryl Bis will take her father as a new pt, I called to schedule but I had to leave a voicemail for her to call back and schedule a new pt appointment with the provider.  Bob Daversa,cma

## 2018-11-26 ENCOUNTER — Ambulatory Visit: Payer: Self-pay | Admitting: Family Medicine

## 2018-11-28 ENCOUNTER — Encounter: Payer: Self-pay | Admitting: Family Medicine

## 2018-11-28 ENCOUNTER — Ambulatory Visit (INDEPENDENT_AMBULATORY_CARE_PROVIDER_SITE_OTHER): Payer: Self-pay | Admitting: Family Medicine

## 2018-11-28 ENCOUNTER — Other Ambulatory Visit: Payer: Self-pay

## 2018-11-28 DIAGNOSIS — Z862 Personal history of diseases of the blood and blood-forming organs and certain disorders involving the immune mechanism: Secondary | ICD-10-CM

## 2018-11-28 DIAGNOSIS — N184 Chronic kidney disease, stage 4 (severe): Secondary | ICD-10-CM

## 2018-11-28 DIAGNOSIS — I5022 Chronic systolic (congestive) heart failure: Secondary | ICD-10-CM

## 2018-11-28 DIAGNOSIS — J9610 Chronic respiratory failure, unspecified whether with hypoxia or hypercapnia: Secondary | ICD-10-CM

## 2018-11-28 NOTE — Progress Notes (Signed)
Virtual Visit via telephone note  This visit type was conducted due to national recommendations for restrictions regarding the COVID-19 pandemic (e.g. social distancing).  This format is felt to be most appropriate for this patient at this time.  All issues noted in this document were discussed and addressed.  No physical exam was performed (except for noted visual exam findings with Video Visits).   I connected with Alex Larson today at  2:45 PM EDT by telephone and verified that I am speaking with the correct person using two identifiers. Location patient: home Location provider: work Persons participating in the virtual visit: patient, provider, Georgia Lopes  I discussed the limitations, risks, security and privacy concerns of performing an evaluation and management service by telephone and the availability of in person appointments. I also discussed with the patient that there may be a patient responsible charge related to this service. The patient expressed understanding and agreed to proceed.  Interactive audio and video telecommunications were attempted between this provider and patient, however failed, due to patient having technical difficulties OR patient did not have access to video capability.  The first 5 minutes of this visit were completed via video though there were sound issues on the patient's and and we continued and completed visit with audio only.  Reason for visit: Establish care  HPI: CAD/heart failure: Patient recently hospitalized with an episode of chest pain and shortness of breath.  He has a known history of CAD, CHF, peripheral vascular disease, and hypertension.  He underwent an echo that revealed an EF of 10 to 15%.  He notes no chest pain, shortness of breath, orthopnea, PND, or edema since discharge.  He had been without his Lasix for a number of months after moving to [place] prior to presenting to the hospital.  He has been doing well since  getting back on his medications.  He has chronic respiratory failure as well and is on 2 L nasal cannula.  He has been on this for several years.  He is not limited in his activities by this.  He notes he can go several hours without his oxygen.  They have not been checking blood pressures.  He is following with cardiology now.  History of anemia: He does report a history of anemia and has been on iron for some time now.  He also reports a history of low platelets and leukopenia.  CKD stage IV: Patient was noted to have elevated creatinine when hospitalized.  He was evaluated by nephrology and he has followed up with them since discharge.  The patient and his daughter note that the nephrologist was able to speak with his nephrologist in [place] and that his kidney function has been stable for some time now.  He notes good urination.  His daughter does wonder about getting him a portable oxygen system.   ROS: See pertinent positives and negatives per HPI.  Past Medical History:  Diagnosis Date  . CAD (coronary artery disease)   . CKD (chronic kidney disease)   . PAD (peripheral artery disease) ([place])     Past Surgical History:  Procedure Laterality Date  . REPLACEMENT TOTAL KNEE BILATERAL      Family History  Problem Relation Age of Onset  . Breast cancer Mother     SOCIAL HX: Former smoker.   Current Outpatient Medications:  .  amLODipine (NORVASC) 5 MG tablet, Take 1 tablet (5 mg total) by mouth daily., Disp: 30 tablet, Rfl: 5 .  carvedilol (COREG) 3.125 MG tablet, Take 1 tablet (3.125 mg total) by mouth 2 (two) times daily with a meal., Disp: 60 tablet, Rfl: 5 .  ferrous sulfate 220 (44 Fe) MG/5ML solution, Take 220 mg by mouth daily., Disp: , Rfl:  .  furosemide (LASIX) 40 MG tablet, Take 1 tablet (40 mg total) by mouth 2 (two) times daily., Disp: 60 tablet, Rfl: 5 .  indapamide (LOZOL) 2.5 MG tablet, Take 1 tablet (2.5 mg total) by mouth daily. 30 minutes prior to furosemide,  Disp: 30 tablet, Rfl: 5 .  isosorbide mononitrate (ISMO) 10 MG tablet, Take 1 tablet (10 mg total) by mouth daily., Disp: 30 tablet, Rfl: 5 .  multivitamin-iron-minerals-folic acid (CENTRUM) chewable tablet, Chew 1 tablet by mouth daily., Disp: , Rfl:  .  pantoprazole (PROTONIX) 40 MG tablet, Take 1 tablet (40 mg total) by mouth daily., Disp: 30 tablet, Rfl: 5  EXAM:  VITALS per patient if applicable:  GENERAL: alert, oriented, appears well and in no acute distress  HEENT: atraumatic, conjunttiva clear, no obvious abnormalities on inspection of external nose and ears  NECK: normal movements of the head and neck  LUNGS: on inspection no signs of respiratory distress, breathing rate appears normal, no obvious gross SOB, gasping or wheezing  CV: no obvious cyanosis  MS: moves all visible extremities without noticeable abnormality  PSYCH/NEURO: pleasant and cooperative, no obvious depression or anxiety, speech and thought processing grossly intact  ASSESSMENT AND PLAN:  Discussed the following assessment and plan:  Chronic respiratory failure ([name]) Stable on home oxygen.  We will see if someone can contact his home health company regarding a portable oxygen tank.  Systolic CHF, chronic ([name]) Symptomatically much improved.  He will continue his current medications through cardiology.  He will monitor for recurrence of symptoms.  History of anemia He reports a history of this.  Possibly could be related to his renal dysfunction.  In the setting of the COVID-19 pandemic we will try to figure out how to get a release of information signed so we can get records from his prior PCP.  Chronic kidney disease (CKD), stage IV (severe) ([place]) He will continue follow-up with nephrology.  This issue seems to be stable.  The patient notes he is on Protonix though he notes no prior symptoms of reflux.  He notes no current symptoms of reflux.  I discussed that we would request records from his prior  PCP to determine why he is on this medication.  Social does not use any precautions and sick precautions given regarding COVID-19.   I discussed the assessment and treatment plan with the patient. The patient was provided an opportunity to ask questions and all were answered. The patient agreed with the plan and demonstrated an understanding of the instructions.   The patient was advised to call back or seek an in-person evaluation if the symptoms worsen or if the condition fails to improve as anticipated.  I provided 25 minutes of non-face-to-face time during this encounter.   [name], MD

## 2018-11-29 ENCOUNTER — Telehealth: Payer: Self-pay | Admitting: Family Medicine

## 2018-11-29 ENCOUNTER — Encounter: Payer: Self-pay | Admitting: Family Medicine

## 2018-11-29 DIAGNOSIS — Z862 Personal history of diseases of the blood and blood-forming organs and certain disorders involving the immune mechanism: Secondary | ICD-10-CM | POA: Insufficient documentation

## 2018-11-29 NOTE — Assessment & Plan Note (Signed)
He reports a history of this.  Possibly could be related to his renal dysfunction.  In the setting of the COVID-19 pandemic we will try to figure out how to get a release of information signed so we can get records from his prior PCP.

## 2018-11-29 NOTE — Assessment & Plan Note (Signed)
He will continue follow-up with nephrology.  This issue seems to be stable.

## 2018-11-29 NOTE — Telephone Encounter (Signed)
This patient requested a portable oxygen tank.  He has been using [facility] out of [place] as his home health supplier.  I was wondering if he would be able to find out how to get him a portable oxygen tank.  Would this need to be sent to [facility] to try to contact them?  Additionally the patient needs to sign a release of information.  Would it be easiest to mail it to them or would be easier for them to come in and have somebody bring it out to their car?  The patient is high risk for complications from [id number] so I do not want to have him come into the office if at all possible.

## 2018-11-29 NOTE — Assessment & Plan Note (Signed)
Stable on home oxygen.  We will see if someone can contact his home health company regarding a portable oxygen tank.

## 2018-11-29 NOTE — Assessment & Plan Note (Signed)
Symptomatically much improved.  He will continue his current medications through cardiology.  He will monitor for recurrence of symptoms.

## 2018-12-01 NOTE — Telephone Encounter (Signed)
Alex Larson should be able to contact his 02 supplier and request portable tank or the patient should be able to call and request. Has patient recently moved here because normally insurance companies use local suppliers. We can mail the release of records . If he recently moved here he may have to change suppliers , depends on if he has medicare he will have to  change to an [place] plan.

## 2018-12-01 NOTE — Telephone Encounter (Signed)
He did recently move here and they noted that somehow the supplier in [place] was supplying his O2 through a local supplier. I will forward to Gae Bon to let the patient and his daughter know that they can contact the company to request the portable tank and if they needed anything from [place] they could let us know.  I will also have her mail the release to the patient so they can sign it and mail it back to [place].

## 2018-12-03 NOTE — Telephone Encounter (Signed)
I called and spoke with the patient's daughter and informed her that she could get the portable tank from her oxygen supplier and if there is a problem to please call.  I also informed her that a medical release would be mailed to her address to sign.  Pt understood.  Nina,cma

## 2018-12-13 NOTE — Progress Notes (Signed)
Patient ID: Alex Larson, male    DOB: [birth year], 85 y.o.   MRN: [id number]  HPI  Alex Larson is Larson 85 y/o male with Larson history of CAD, HTN, CKD, PAD and chronic heart failure.   Echo report from 10/28/2018 reviewed and showed an EF of 10-15% along with moderate/ severe TR.  Admitted 10/27/2018 due to heart failure along with chest pain. Cardiology, nephrology and palliative care consults obtained. He had been out of lasix for the previous 4 months. Initially given IV lasix and then transitioned to oral diuretics. Elevated troponin thought to be due to demand ischemia. Not on ACE-i or entresto due to renal failure. Discharged after 6 days.   He presents today for Larson follow-up visit with Larson chief complaint of minimal shortness of breath upon moderate exertion. He describes this as being present for several years with varying levels of severity. He has associated pedal edema and swelling in his left hand. He denies any difficulty sleeping, dizziness, abdominal distention, palpitations, chest pain, cough, fatigue or weight gain.   Past Medical History:  Diagnosis Date  . CAD (coronary artery disease)   . CHF (congestive heart failure) ([place])   . CKD (chronic kidney disease)   . Hypertension   . PAD (peripheral artery disease) ([place])    Past Surgical History:  Procedure Laterality Date  . REPLACEMENT TOTAL KNEE BILATERAL     Family History  Problem Relation Age of Onset  . Breast cancer Mother    Social History   Tobacco Use  . Smoking status: Former Research scientist (life sciences)  . Smokeless tobacco: Former Systems developer  . Tobacco comment: quit age 77, smoked from teenage years  Substance Use Topics  . Alcohol use: Not Currently   No Known Allergies Prior to Admission medications   Medication Sig Start Date End Date Taking? Authorizing Provider  amLODipine (NORVASC) 5 MG tablet Take 1 tablet (5 mg total) by mouth daily. 11/04/18  Yes Alex Price Larson, Larson  carvedilol (COREG) 3.125 MG tablet Take 1 tablet (3.125 mg  total) by mouth 2 (two) times daily with Larson meal. 11/04/18  Yes Alex Price Larson, Larson  ferrous sulfate 220 (44 Fe) MG/5ML solution Take 220 mg by mouth daily.   Yes [provider]  furosemide (LASIX) 40 MG tablet Take 1 tablet (40 mg total) by mouth 2 (two) times daily. 11/04/18  Yes Alex Larson  indapamide (LOZOL) 2.5 MG tablet Take 1 tablet (2.5 mg total) by mouth daily. 30 minutes prior to furosemide 11/04/18  Yes Alex Larson  isosorbide mononitrate (ISMO) 10 MG tablet Take 1 tablet (10 mg total) by mouth daily. 11/04/18  Yes Alex Larson  multivitamin-iron-minerals-folic acid (CENTRUM) chewable tablet Chew 1 tablet by mouth daily.   Yes [provider]  pantoprazole (PROTONIX) 40 MG tablet Take 1 tablet (40 mg total) by mouth daily. 11/04/18  Yes Alex Larson    Review of Systems  Constitutional: Negative for appetite change and fatigue.  HENT: Negative for congestion, postnasal drip and sore throat.   Eyes: Negative.   Respiratory: Positive for shortness of breath (with moderate exertion). Negative for cough.   Cardiovascular: Positive for leg swelling. Negative for chest pain and palpitations.  Gastrointestinal: Negative for abdominal distention and abdominal pain.  Endocrine: Negative.   Genitourinary: Negative.   Musculoskeletal: Positive for arthralgias (left hand).  Skin: Negative.   Allergic/Immunologic: Negative.   Neurological: Negative for dizziness and light-headedness.  Hematological: Negative for  adenopathy. Does not bruise/bleed easily.  Psychiatric/Behavioral: Negative for dysphoric mood and sleep disturbance (sleeping on 1 pillow; wearing oxygen at 2L at bedtime). The patient is not nervous/anxious.     Vitals:   12/16/18 1220  BP: 134/77  Pulse: 63  Resp: 18  SpO2: 90%  Weight: 156 lb 2 oz (70.8 kg)  Height: 6\' 2"  (1.88 m)   Wt Readings from Last 3 Encounters:  12/16/18 156 lb 2 oz (70.8 kg)  11/02/18 149 lb 9.6 oz  (67.9 kg)   Lab Results  Component Value Date   CREATININE 3.46 (H) 11/02/2018   CREATININE 3.69 (H) 11/01/2018   CREATININE 3.71 (H) 10/31/2018    Physical Exam Vitals signs and nursing note reviewed.  Constitutional:      Appearance: He is well-developed.  HENT:     Head: Normocephalic and atraumatic.  Neck:     Musculoskeletal: Normal range of motion.     Vascular: No JVD.  Cardiovascular:     Rate and Rhythm: Normal rate and regular rhythm.  Pulmonary:     Effort: Pulmonary effort is normal. No respiratory distress.     Breath sounds: No wheezing or rales.  Abdominal:     Palpations: Abdomen is soft.     Tenderness: There is no abdominal tenderness.  Musculoskeletal:     Right lower leg: He exhibits no tenderness. Edema present.     Left lower leg: He exhibits no tenderness. Edema present.  Skin:    General: Skin is warm and dry.  Neurological:     General: No focal deficit present.     Mental Status: He is alert and oriented to person, place, and time.  Psychiatric:        Behavior: Behavior normal.    Assessment & Plan:  1. Chronic heart failure with reduced ejection fraction- - NYHA class II - euvolemic  - now weighing daily and his daughter says that his weight ranges from 159-160 pounds  - reminded to call for an overnight weight gain of >2 pounds or Larson weekly weight gain of >5 pounds - not adding salt and is trying to eat low sodium foods - will contact nephrology to see if entresto could be used in this patient - palliative care saw patient 11/12/2018 - BNP 10/27/2018 was >4500.0  2: HTN- - BP looks good today - saw PCP Alex Larson) 11/28/2018 - BMP from 11/02/2018 reviewed and showed sodium 137, potassium 3.6, creatinine 3.46 and GFR 17   3: CKD stage 4- - follows with nephrology (Alex Larson)   Medication list was reviewed.  Return in 2 months or sooner for any questions/problems before then.

## 2018-12-16 ENCOUNTER — Ambulatory Visit: Payer: Medicare PPO | Attending: Family | Admitting: Family

## 2018-12-16 ENCOUNTER — Encounter: Payer: Self-pay | Admitting: Family

## 2018-12-16 ENCOUNTER — Other Ambulatory Visit: Payer: Self-pay

## 2018-12-16 VITALS — BP 134/77 | HR 63 | Resp 18 | Ht 74.0 in | Wt 156.1 lb

## 2018-12-16 DIAGNOSIS — Z96653 Presence of artificial knee joint, bilateral: Secondary | ICD-10-CM | POA: Diagnosis not present

## 2018-12-16 DIAGNOSIS — I2510 Atherosclerotic heart disease of native coronary artery without angina pectoris: Secondary | ICD-10-CM | POA: Diagnosis not present

## 2018-12-16 DIAGNOSIS — I130 Hypertensive heart and chronic kidney disease with heart failure and stage 1 through stage 4 chronic kidney disease, or unspecified chronic kidney disease: Secondary | ICD-10-CM | POA: Diagnosis not present

## 2018-12-16 DIAGNOSIS — Z803 Family history of malignant neoplasm of breast: Secondary | ICD-10-CM | POA: Diagnosis not present

## 2018-12-16 DIAGNOSIS — N184 Chronic kidney disease, stage 4 (severe): Secondary | ICD-10-CM | POA: Diagnosis present

## 2018-12-16 DIAGNOSIS — I10 Essential (primary) hypertension: Secondary | ICD-10-CM

## 2018-12-16 DIAGNOSIS — I5022 Chronic systolic (congestive) heart failure: Secondary | ICD-10-CM | POA: Diagnosis present

## 2018-12-16 DIAGNOSIS — I739 Peripheral vascular disease, unspecified: Secondary | ICD-10-CM | POA: Insufficient documentation

## 2018-12-16 DIAGNOSIS — Z87891 Personal history of nicotine dependence: Secondary | ICD-10-CM | POA: Insufficient documentation

## 2018-12-16 DIAGNOSIS — Z79899 Other long term (current) drug therapy: Secondary | ICD-10-CM | POA: Diagnosis not present

## 2018-12-16 NOTE — Patient Instructions (Addendum)
Continue weighing daily and call for an overnight weight gain of > 2 pounds or a weekly weight gain of >5 pounds. 

## 2018-12-17 ENCOUNTER — Telehealth: Payer: Self-pay | Admitting: Family

## 2018-12-17 MED ORDER — SACUBITRIL-VALSARTAN 24-26 MG PO TABS: 1.0000 | ORAL_TABLET | Freq: Two times a day (BID) | ORAL | 3 refills | Status: DC

## 2018-12-17 NOTE — Telephone Encounter (Signed)
Spoke with patient's daughter Alex Larson) regarding the use of entresto. Had consulted with patient's nephrologist [facility]) who responded that trying entresto would be fine. Patient sees Dr. Juleen China on 12/26/2018 and he can check a BMP at that time.   Discussed the use of entresto with patient's daughter and that we may be able to decrease diuretic depending on patient's response to entresto. Daughter is willing to try the medication so will call in the RX to [facility] on [address]. She will hold off getting the coupon card at this time.

## 2019-02-16 ENCOUNTER — Ambulatory Visit: Payer: Medicare PPO | Admitting: Family

## 2019-02-23 ENCOUNTER — Ambulatory Visit (INDEPENDENT_AMBULATORY_CARE_PROVIDER_SITE_OTHER): Payer: Medicare PPO | Admitting: Internal Medicine

## 2019-02-23 ENCOUNTER — Encounter: Payer: Self-pay | Admitting: Internal Medicine

## 2019-02-23 ENCOUNTER — Other Ambulatory Visit: Payer: Self-pay

## 2019-02-23 VITALS — BP 120/60 | HR 63 | Ht 74.0 in | Wt 166.5 lb

## 2019-02-23 DIAGNOSIS — I739 Peripheral vascular disease, unspecified: Secondary | ICD-10-CM | POA: Diagnosis not present

## 2019-02-23 DIAGNOSIS — I2510 Atherosclerotic heart disease of native coronary artery without angina pectoris: Secondary | ICD-10-CM | POA: Diagnosis not present

## 2019-02-23 DIAGNOSIS — I5042 Chronic combined systolic (congestive) and diastolic (congestive) heart failure: Secondary | ICD-10-CM

## 2019-02-23 DIAGNOSIS — N184 Chronic kidney disease, stage 4 (severe): Secondary | ICD-10-CM

## 2019-02-23 DIAGNOSIS — I10 Essential (primary) hypertension: Secondary | ICD-10-CM

## 2019-02-23 DIAGNOSIS — J9611 Chronic respiratory failure with hypoxia: Secondary | ICD-10-CM | POA: Diagnosis not present

## 2019-02-23 NOTE — Progress Notes (Signed)
Follow-up Outpatient Visit Date: 02/23/2019  Primary Care Provider: Leone Haven, MD [address] [place] [address]  Chief Complaint: Follow-up heart failure  HPI:  Alex Larson is a 85 y.o. year-old male with history of coronary artery disease status post PCI x2 (details unknown), chronic HFrEF, CKD, PAD status post RLE bypass, chronic respiratory failure with hypoxia, and hypertension, who presents for follow-up of coronary artery disease and heart failure.  He was admitted in May with a one-week history of leg swelling and shortness of breath.  He had moved from [place] to [place] about 4 months earlier.  Echocardiogram during that admission showed an LVEF of 10-15%.  Given advanced age and other comorbidities (CKD and PAD), cardiac catheterization was not pursued.  He was seen by Darylene Price, NP, in mid July, at which time he reported mild shortness of breath.  After speaking with Alex Larson's nephrologist, Dr. Abigail Butts, Delene Loll was added by Ms. Hacknes.  Today, Alex Larson reports that he is feeling well.  He denies chest pain, shortness of breath, edema, orthopnea, palpitations, and lightheadedness.  He continues to wear 2L of supplemental oxygen at home but has yet to be able to procure portable oxygen (he is not wearing any at today's visit).  He is tolerating his medications well, including Entresto.  He notes that his weight initial increased after his hospitalization in May but that it has recently been stable at 162-164 pounds.  --------------------------------------------------------------------------------------------------  Past Medical History:  Diagnosis Date   CAD (coronary artery disease)    CHF (congestive heart failure) ([name])    CKD (chronic kidney disease)    Hypertension    PAD (peripheral artery disease) ([place])    Past Surgical History:  Procedure Laterality Date   BYPASS GRAFT     Right lower extremity   CORONARY STENT  INTERVENTION     x2 in [place].  Details unknown.   REPLACEMENT TOTAL KNEE BILATERAL      Current Meds  Medication Sig   amLODipine (NORVASC) 5 MG tablet Take 1 tablet (5 mg total) by mouth daily.   carvedilol (COREG) 3.125 MG tablet Take 1 tablet (3.125 mg total) by mouth 2 (two) times daily with a meal.   ferrous sulfate 220 (44 Fe) MG/5ML solution Take 220 mg by mouth daily.   furosemide (LASIX) 40 MG tablet Take 1 tablet (40 mg total) by mouth 2 (two) times daily.   indapamide (LOZOL) 2.5 MG tablet Take 1 tablet (2.5 mg total) by mouth daily. 30 minutes prior to furosemide   isosorbide mononitrate (ISMO) 10 MG tablet Take 1 tablet (10 mg total) by mouth daily.   multivitamin-iron-minerals-folic acid (CENTRUM) chewable tablet Chew 1 tablet by mouth daily.   pantoprazole (PROTONIX) 40 MG tablet Take 1 tablet (40 mg total) by mouth daily.   sacubitril-valsartan (ENTRESTO) 24-26 MG Take 1 tablet by mouth 2 (two) times daily.    Allergies: Patient has no known allergies.  Social History   Tobacco Use   Smoking status: Former Smoker   Smokeless tobacco: Former Systems developer   Tobacco comment: quit age 29, smoked from teenage years  Substance Use Topics   Alcohol use: Not Currently   Drug use: Never    Family History  Problem Relation Age of Onset   Breast cancer Mother     Review of Systems: A 12-system review of systems was performed and was negative except as noted in the HPI.  --------------------------------------------------------------------------------------------------  Physical Exam: BP 120/60 (  BP Location: Left Arm, Patient Position: Sitting, Cuff Size: Normal)    Pulse 63    Ht 6\' 2"  (1.88 m)    Wt 166 lb 8 oz (75.5 kg)    SpO2 (!) 80%    BMI 21.38 kg/m   General:  NAD. HEENT: No conjunctival pallor or scleral icterus. Facemask in place Neck: Supple without lymphadenopathy, thyromegaly, JVD, or HJR Lungs: Normal work of breathing. Mildly diminished  throughout without wheezes or crackles. Heart: Regular rate and rhythm without murmurs, rubs, or gallops. Abd: Bowel sounds present. Soft, NT/ND without hepatosplenomegaly Ext: No lower extremity edema. Skin: Warm and dry without rash.  EKG:  NSR with left axis deviation, RBBB, LVH, and poor R-wave progression.  HR has increased since prior tracing on 10/28/2018.  Otherwise, there has been no significant interval change.  Lab Results  Component Value Date   WBC 3.5 (L) 10/29/2018   HGB 11.4 (L) 10/29/2018   HCT 33.5 (L) 10/29/2018   MCV 85.0 10/29/2018   PLT 122 (L) 10/29/2018    Lab Results  Component Value Date   NA 137 11/02/2018   K 3.6 11/02/2018   CL 100 11/02/2018   CO2 25 11/02/2018   BUN 84 (H) 11/02/2018   CREATININE 3.46 (H) 11/02/2018   GLUCOSE 86 11/02/2018   ALT 23 10/27/2018    Lab Results  Component Value Date   CHOL 240 (H) 10/27/2018   HDL 45 10/27/2018   LDLCALC 172 (H) 10/27/2018   TRIG 117 10/27/2018   CHOLHDL 5.3 10/27/2018    --------------------------------------------------------------------------------------------------  ASSESSMENT AND PLAN: Chronic systolic and diastolic heart failure: Alex Larson appears euvolemic on exam today.  He is without complaints, though his mobility remains quite limited.  He is tolerating low-dose carvedilol as well as Entresto well.  He was seen by Dr. Abigail Butts today and reported had labs checked.  I will reach out to Dr. Juleen China tomorrow to discuss his lab results as well as the safety of continued Entresto use in the setting of advanced CKD.  For now, we will continue his current regimen of carvedilol, Entresto, furosemide, and indapamide.  Low resting heart rate precludes further escalation of carvedilol at this time.  Given reasonable blood pressure today, we could consider a trial of hydralazine/isosorbide dinitrate in the future if renal function precludes further escalation of Entresto.  Coronary artery  disease: No signs or symptoms of worsening coronary insufficiency.  Currently, the patient is not on any antiplatelet therapy.  We will consider adding aspirin 81 mg daily after my discussion with Dr. Juleen China.  Given LDL of 172 in May, statin therapy is indicated.  We will readdress this at follow-up, given Alex Larson age and comorbidities.  Peripheral vascular disease: No claudication reported.  Consider adding aspirin as above and readdressing statin therapy at follow-up visit.  Chronic respiratory failure with hypoxia: Longstanding and likely multifactorial.  Alex Larson does not appear volume overloaded on exam today but has oxygen saturations of 80-88% on room air (asymptomatic).  He was placed on 2L of supplemental oxygen in our office with prompt improvement in his O2 saturation to 98%.  We advised him to return home as quickly as possible, as he does not have portable oxygen at this time.  His daughter will reach out to Dr. Caryl Bis and Alex Larson's oxygen supplier to procure portable oxygen.  Chronic kidney disease stage 4: Longstanding and relatively stable during hospitalization in May.  Alex Larson reports being seen by nephrology earlier today,  at which time labs were performed.  I will reach out to Dr. Juleen China tomorrow to discuss the lab results and feasibility of continuing current therapy (Entresto, furosemide, and indapamide).  Hypertension: BP well-controlled.  We will not make any medication changes today.  We could consider stopping amlodipine in favor of increasing Entresto (not ideal in the setting of advanced CKD) or adding hydralazine/isosorbide dinitrate.  Follow-up: Return to clinic in 3 months.  Continue follow-up in the heart failure clinic as previously arranged.  Nelva Bush, MD 02/23/2019 2:56 PM

## 2019-02-23 NOTE — Patient Instructions (Signed)
Your provider recommends you go home and put on your home oxygen when you get home.  Medication Instructions:  Your physician recommends that you continue on your current medications as directed. Please refer to the Current Medication list given to you today.  If you need a refill on your cardiac medications before your next appointment, please call your pharmacy.   Lab work: NONE  If you have labs (blood work) drawn today and your tests are completely normal, you will receive your results only by: Marland Kitchen MyChart Message (if you have MyChart) OR . A paper copy in the mail If you have any lab test that is abnormal or we need to change your treatment, we will call you to review the results.  Testing/Procedures: NONE  Follow-Up: At [facility], you and your health needs are our priority.  As part of our continuing mission to provide you with exceptional heart care, we have created designated Provider Care Teams.  These Care Teams include your primary Cardiologist (physician) and Advanced Practice Providers (APPs -  Physician Assistants and Nurse Practitioners) who all work together to provide you with the care you need, when you need it. You will need a follow up appointment in 3 months.   You may see Nelva Bush, MD or one of the following Advanced Practice Providers on your designated Care Team:   Murray Hodgkins, NP Christell Faith, PA-C . Marrianne Mood, PA-C

## 2019-02-23 NOTE — Progress Notes (Signed)
Patient ID: Alex Larson, male    DOB: Nov 28, 1926, 85 y.o.   MRN: [id number]  HPI  Mr Prabhakar is a 85 y/o male with a history of CAD, HTN, CKD, PAD and chronic heart failure.   Echo report from 10/28/2018 reviewed and showed an EF of 10-15% along with moderate/ severe TR.  Admitted 10/27/2018 due to heart failure along with chest pain. Cardiology, nephrology and palliative care consults obtained. He had been out of lasix for the previous 4 months. Initially given IV lasix and then transitioned to oral diuretics. Elevated troponin thought to be due to demand ischemia. Not on ACE-i or entresto due to renal failure. Discharged after 6 days.   He presents today for a follow-up visit with a chief complaint of minimal shortness of breath upon moderate exertion. He describes this as chronic in nature having been present for several years. He has associated arthritis in his left hand. He denies any difficulty sleeping, dizziness, abdominal distention, palpitations, pedal edema, chest pain, cough, fatigue or weight gain. Has tolerated the addition of entresto without known side effects and just had lab work done at his nephrologist's office yesterday. Saw his cardiologist, Dr. Saunders Revel, yesterday as well.   Past Medical History:  Diagnosis Date  . CAD (coronary artery disease)   . CHF (congestive heart failure) ([place])   . CKD (chronic kidney disease)   . Hypertension   . PAD (peripheral artery disease) ([place])    Past Surgical History:  Procedure Laterality Date  . BYPASS GRAFT     Right lower extremity  . CORONARY STENT INTERVENTION     x2 in [place].  Details unknown.  . REPLACEMENT TOTAL KNEE BILATERAL     Family History  Problem Relation Age of Onset  . Breast cancer Mother    Social History   Tobacco Use  . Smoking status: Former Research scientist (life sciences)  . Smokeless tobacco: Former Systems developer  . Tobacco comment: quit age 78, smoked from teenage years  Substance Use Topics  . Alcohol use: Not Currently   No  Known Allergies  Prior to Admission medications   Medication Sig Start Date End Date Taking? Authorizing Provider  amLODipine (NORVASC) 5 MG tablet Take 1 tablet (5 mg total) by mouth daily. 11/04/18  Yes Darylene Price A, FNP  carvedilol (COREG) 3.125 MG tablet Take 1 tablet (3.125 mg total) by mouth 2 (two) times daily with a meal. 11/04/18  Yes Darylene Price A, FNP  ferrous sulfate 220 (44 Fe) MG/5ML solution Take 220 mg by mouth daily.   Yes [provider]  furosemide (LASIX) 40 MG tablet Take 1 tablet (40 mg total) by mouth 2 (two) times daily. 11/04/18  Yes Steffany Schoenfelder, Otila Kluver A, FNP  indapamide (LOZOL) 2.5 MG tablet Take 1 tablet (2.5 mg total) by mouth daily. 30 minutes prior to furosemide 11/04/18  Yes Zoei Amison, Otila Kluver A, FNP  isosorbide mononitrate (ISMO) 10 MG tablet Take 1 tablet (10 mg total) by mouth daily. 11/04/18  Yes Tajay Muzzy, Otila Kluver A, FNP  multivitamin-iron-minerals-folic acid (CENTRUM) chewable tablet Chew 1 tablet by mouth daily.   Yes [provider]  pantoprazole (PROTONIX) 40 MG tablet Take 1 tablet (40 mg total) by mouth daily. 11/04/18  Yes Shawnette Augello A, FNP  sacubitril-valsartan (ENTRESTO) 24-26 MG Take 1 tablet by mouth 2 (two) times daily. 12/17/18  Yes Alisa Graff, FNP    Review of Systems  Constitutional: Negative for appetite change and fatigue.  HENT: Negative for congestion, postnasal drip and sore  throat.   Eyes: Negative.   Respiratory: Positive for shortness of breath (with moderate exertion). Negative for cough.   Cardiovascular: Negative for chest pain, palpitations and leg swelling.  Gastrointestinal: Negative for abdominal distention and abdominal pain.  Endocrine: Negative.   Genitourinary: Negative.   Musculoskeletal: Positive for arthralgias (left hand).  Skin: Negative.   Allergic/Immunologic: Negative.   Neurological: Negative for dizziness and light-headedness.  Hematological: Negative for adenopathy. Does not bruise/bleed easily.   Psychiatric/Behavioral: Negative for dysphoric mood and sleep disturbance (sleeping on 1 pillow; wearing oxygen at 2L at bedtime). The patient is not nervous/anxious.    Vitals:   02/24/19 1024  BP: 134/69  Pulse: 69  Resp: 18  SpO2: 94%  Weight: 166 lb (75.3 kg)  Height: 6\' 2"  (1.88 m)   Wt Readings from Last 3 Encounters:  02/24/19 166 lb (75.3 kg)  02/23/19 166 lb 8 oz (75.5 kg)  12/16/18 156 lb 2 oz (70.8 kg)   Lab Results  Component Value Date   CREATININE 3.46 (H) 11/02/2018   CREATININE 3.69 (H) 11/01/2018   CREATININE 3.71 (H) 10/31/2018    Physical Exam Vitals signs and nursing note reviewed.  Constitutional:      Appearance: He is well-developed.  HENT:     Head: Normocephalic and atraumatic.  Neck:     Musculoskeletal: Normal range of motion.     Vascular: No JVD.  Cardiovascular:     Rate and Rhythm: Normal rate and regular rhythm.  Pulmonary:     Effort: Pulmonary effort is normal. No respiratory distress.     Breath sounds: No wheezing or rales.  Abdominal:     Palpations: Abdomen is soft.     Tenderness: There is no abdominal tenderness.  Musculoskeletal:        General: No tenderness.     Right lower leg: He exhibits no tenderness. No edema.     Left lower leg: He exhibits no tenderness. No edema.  Skin:    General: Skin is warm and dry.  Neurological:     General: No focal deficit present.     Mental Status: He is alert and oriented to person, place, and time.  Psychiatric:        Behavior: Behavior normal.    Assessment & Plan:  1. Chronic heart failure with reduced ejection fraction- - NYHA class II - euvolemic  - weighing daily and his daughter says that his weight has slowly increased - reminded to call for an overnight weight gain of >2 pounds or a weekly weight gain of >5 pounds - weight up 10 pounds from last visit 2 months ago - not adding salt and is trying to eat low sodium foods - entresto started at his last visit; consider  titration if renal function allows - saw cardiology (End) 02/23/2019 - palliative care saw patient 11/12/2018 - BNP 10/27/2018 was >4500.0  2: HTN- - BP looks good today - saw PCP Caryl Bis) 11/28/2018 - BMP from 11/02/2018 reviewed and showed sodium 137, potassium 3.6, creatinine 3.46 and GFR 17   3: CKD stage 4- - follows with nephrology ([place])  - will request lab work that was drawn yesterday  Medication list was reviewed.  Return in 2 months or sooner for any questions/problems before then.

## 2019-02-24 ENCOUNTER — Ambulatory Visit: Payer: Medicare PPO | Attending: Family | Admitting: Family

## 2019-02-24 ENCOUNTER — Telehealth: Payer: Self-pay | Admitting: Internal Medicine

## 2019-02-24 ENCOUNTER — Encounter: Payer: Self-pay | Admitting: Family

## 2019-02-24 VITALS — BP 134/69 | HR 69 | Resp 18 | Ht 74.0 in | Wt 166.0 lb

## 2019-02-24 DIAGNOSIS — N184 Chronic kidney disease, stage 4 (severe): Secondary | ICD-10-CM | POA: Insufficient documentation

## 2019-02-24 DIAGNOSIS — I10 Essential (primary) hypertension: Secondary | ICD-10-CM

## 2019-02-24 DIAGNOSIS — Z87891 Personal history of nicotine dependence: Secondary | ICD-10-CM | POA: Insufficient documentation

## 2019-02-24 DIAGNOSIS — I130 Hypertensive heart and chronic kidney disease with heart failure and stage 1 through stage 4 chronic kidney disease, or unspecified chronic kidney disease: Secondary | ICD-10-CM | POA: Insufficient documentation

## 2019-02-24 DIAGNOSIS — I5022 Chronic systolic (congestive) heart failure: Secondary | ICD-10-CM

## 2019-02-24 DIAGNOSIS — I739 Peripheral vascular disease, unspecified: Secondary | ICD-10-CM | POA: Diagnosis not present

## 2019-02-24 DIAGNOSIS — I2510 Atherosclerotic heart disease of native coronary artery without angina pectoris: Secondary | ICD-10-CM | POA: Diagnosis not present

## 2019-02-24 DIAGNOSIS — Z79899 Other long term (current) drug therapy: Secondary | ICD-10-CM | POA: Insufficient documentation

## 2019-02-24 DIAGNOSIS — Z955 Presence of coronary angioplasty implant and graft: Secondary | ICD-10-CM | POA: Diagnosis not present

## 2019-02-24 DIAGNOSIS — R0602 Shortness of breath: Secondary | ICD-10-CM | POA: Insufficient documentation

## 2019-02-24 MED ORDER — ASPIRIN EC 81 MG PO TBEC: 81.0000 mg | DELAYED_RELEASE_TABLET | Freq: Every day | ORAL | 3 refills | Status: DC

## 2019-02-24 MED ORDER — SACUBITRIL-VALSARTAN 24-26 MG PO TABS: 2.0000 | ORAL_TABLET | Freq: Two times a day (BID) | ORAL | 3 refills | Status: DC

## 2019-02-24 NOTE — Telephone Encounter (Signed)
BMP obtained yesterday by Dr. Juleen China reviewed.  Creatinine at baseline at 3.1 (improved slightly since 12/2018).  I spoke with Dr. Juleen China and the patient's daughter, and we have agreed to double [facility] and check a BMP in 1 week.  We will also start aspirin 81 mg daily, given h/o CAD and PAD.  Nelva Bush, MD [facility] Pager: [phone]

## 2019-02-24 NOTE — Patient Instructions (Signed)
Continue weighing daily and call for an overnight weight gain of > 2 pounds or a weekly weight gain of >5 pounds. 

## 2019-02-24 NOTE — Telephone Encounter (Signed)
Spoke with patient's daughter and she verbalized understanding to take patient to the [facility] in about 1 week on Sept 30 for repeat lab work. She is aware patient should start aspirin 81 mg daily and increase Enstresto to 2 tablets two times a day of his current pill. Med list updated.

## 2019-02-24 NOTE — Addendum Note (Signed)
Addended by: Vanessa Ralphs on: 02/24/2019 03:20 PM   Modules accepted: Orders

## 2019-02-25 ENCOUNTER — Telehealth: Payer: Self-pay

## 2019-02-25 ENCOUNTER — Telehealth: Payer: Self-pay | Admitting: Internal Medicine

## 2019-02-25 DIAGNOSIS — J9612 Chronic respiratory failure with hypercapnia: Secondary | ICD-10-CM

## 2019-02-25 NOTE — Telephone Encounter (Signed)
Copied from [place] [id number]. Topic: General - Other >> Feb 25, 2019  2:35 PM Antonieta Iba C wrote: Reason for CRM: pt's daughter Lavone Nian called in to speak with provider or his assistant.   CB: [phone]

## 2019-02-25 NOTE — Telephone Encounter (Signed)
Patient caregiver calling They are having trouble getting portable oxygen, needing all new equipment  States we would need to send an order to a DME company  Please call to discuss

## 2019-02-25 NOTE — Telephone Encounter (Signed)
Called patient's caregiver and let her know she would need to contact patient's PCP regarding oxygen order. She was appreciative and verbalized understanding.

## 2019-02-26 NOTE — Telephone Encounter (Signed)
Ok let me know if I should schedule anything.  Nina,cma

## 2019-02-26 NOTE — Telephone Encounter (Signed)
I called the daughter and scheduled a nurse visit to check 02 sat.  Jivan Symanski,cma

## 2019-02-26 NOTE — Telephone Encounter (Signed)
Noted. I am fine ordering this. I placed an order for home O2 and I will forward to Juliann Pulse and Lenna Sciara to make sure this is the correct order and to see if we need to bring the patient in to complete formal O2 testing to get this approved and documented.

## 2019-02-26 NOTE — Telephone Encounter (Signed)
Medicare will require we do the walking qualify 02 sats, if we do not have a copy of testing mentioned by patient in  Note below.

## 2019-02-26 NOTE — Telephone Encounter (Signed)
Please let the patients daughter know that we will need to complete the walking testing of his O2 to submit the order. He will need to have a nurse visit to have this completed. Thanks.

## 2019-02-26 NOTE — Telephone Encounter (Signed)
I called and spoke with the patient's daughter and she stated that the patient went to his cardiology appt on yesterday and when they checked his oxygen level it was at 80%, they gave him oxygen at the office and made him stay until his oxygen was at 98.  The conclusion is that the patient needs a portable oxygen tank.  The patient has a concentrator at home and it is from a company called [facility] and it is in [place],  They do not carry portable tanks so they suggested she get with a local company that can give the patient a concentrator and a portable oxygen tank and the cardiologist, and nephrologist states his primary will have to be the one to order this.  The patient had testing done for oxygen on last Thursday so now he just needs a local company to give him the concentrator and portable oxygen.  Please advise.  Erickson Yamashiro,cma

## 2019-03-03 ENCOUNTER — Ambulatory Visit (INDEPENDENT_AMBULATORY_CARE_PROVIDER_SITE_OTHER): Payer: Medicare PPO | Admitting: *Deleted

## 2019-03-03 ENCOUNTER — Other Ambulatory Visit: Payer: Self-pay

## 2019-03-03 DIAGNOSIS — J9611 Chronic respiratory failure with hypoxia: Secondary | ICD-10-CM

## 2019-03-03 NOTE — Progress Notes (Signed)
Patient into office for Ambulatory 02 sat for home use. Patient came into office on room air for 1 hour 02 sats were 77% with pulse of 91. Applied 02 at 2 liters 02 sat took approximately 15 minutes to attain 97% on 2 liters. Started ambulation at 97% on 2 liters patient ambulated approximately 100 feet on 2 liters and 02 sats dropped to 77%, patient had to stop ambulation due to shortness of breath. Increased 02 to 3 liters and patient 02 sats came up to 89% on 3  Liter at beginning of ambulation for second try immediately 02 sats dropped again to 80 % and increased to 4 liter 02, patient 02 sats maintained at 97%  With ambulating for 2 minutes .  Walked patient to car and advised patient to go straight home and reapply 02 at 2 liters and that I would contact PCP and update.   CMA cleaned patient room after ambulating and advised me of a bug that was found in the chair where patient wassitting the bug was a bed bug app. Between  4 to 5 th fed per on line chart. There was some blood in the chair also. Advised PCP, by phone call.

## 2019-03-03 NOTE — Progress Notes (Signed)
I have reviewed the above note and agree. Please see if he can do a virtual visit at 3:15 tomorrow for follow-up. I can discuss the bed bug at that time.   [name], M.D.

## 2019-03-04 ENCOUNTER — Telehealth: Payer: Self-pay | Admitting: *Deleted

## 2019-03-04 ENCOUNTER — Encounter: Payer: Self-pay | Admitting: Family Medicine

## 2019-03-04 ENCOUNTER — Ambulatory Visit (INDEPENDENT_AMBULATORY_CARE_PROVIDER_SITE_OTHER): Payer: Medicare PPO | Admitting: Family Medicine

## 2019-03-04 DIAGNOSIS — B882 Other arthropod infestations: Secondary | ICD-10-CM

## 2019-03-04 DIAGNOSIS — W57XXXA Bitten or stung by nonvenomous insect and other nonvenomous arthropods, initial encounter: Secondary | ICD-10-CM

## 2019-03-04 DIAGNOSIS — J9611 Chronic respiratory failure with hypoxia: Secondary | ICD-10-CM | POA: Diagnosis not present

## 2019-03-04 NOTE — Addendum Note (Signed)
Addended by: Caryl Bis Nealie Mchatton G on: 03/04/2019 04:46 PM   Modules accepted: Level of Service

## 2019-03-04 NOTE — Assessment & Plan Note (Signed)
Overall stable.  Discussed that he needs to use 2 L of oxygen at rest and 4 L with ambulation.  We will get information from the home health company to see if we can transfer his oxygen supplies to a company in [place].  Information was given to our clinical RN Kerin Salen.

## 2019-03-04 NOTE — Assessment & Plan Note (Signed)
They are getting the bedbugs treated.  No significant issues related to this.

## 2019-03-04 NOTE — Progress Notes (Addendum)
Virtual Visit via telephone Note  This visit type was conducted due to national recommendations for restrictions regarding the COVID-19 pandemic (e.g. social distancing).  This format is felt to be most appropriate for this patient at this time.  All issues noted in this document were discussed and addressed.  No physical exam was performed (except for noted visual exam findings with Video Visits).   I connected with Alex Larson today at  3:15 PM EDT by telephone and verified that I am speaking with the correct person using two identifiers. Location patient: home Location provider: work Persons participating in the virtual visit: patient, provider, Mrs Uvaldo Bristle  I discussed the limitations, risks, security and privacy concerns of performing an evaluation and management service by telephone and the availability of in person appointments. I also discussed with the patient that there may be a patient responsible charge related to this service. The patient expressed understanding and agreed to proceed.  Interactive audio and video telecommunications were attempted between this provider and patient, however failed, due to patient having technical difficulties OR patient did not have access to video capability.  We continued and completed visit with audio only.  Reason for visit: follow-up  HPI: Chronic hypoxic respiratory failure: Patient notes he is doing quite well with regards to this.  He notes no shortness of breath while wearing his oxygen.  He does note some mild dyspnea on exertion that is chronic and stable without oxygen.  Rare cough that is chronic.  No wheezing.  He has been on oxygen since 2018 though they are not 100% sure what month.  His daughter has the information for his home health company in [place].  He has never been evaluated by pulmonology though notes he is on oxygen for respiratory failure related to cardiac cause.  Bedbugs: The patient's daughter notes that they have  found those in the house they moved into recently.  She has been attacking this issue and has a company coming out to treat the house as well.   ROS: See pertinent positives and negatives per HPI.  Past Medical History:  Diagnosis Date  . CAD (coronary artery disease)   . CHF (congestive heart failure) ([place])   . CKD (chronic kidney disease)   . Hypertension   . PAD (peripheral artery disease) ([place])     Past Surgical History:  Procedure Laterality Date  . BYPASS GRAFT     Right lower extremity  . CORONARY STENT INTERVENTION     x2 in [place].  Details unknown.  . REPLACEMENT TOTAL KNEE BILATERAL      Family History  Problem Relation Age of Onset  . Breast cancer Mother     SOCIAL HX: Former smoker   Current Outpatient Medications:  .  amLODipine (NORVASC) 5 MG tablet, Take 1 tablet (5 mg total) by mouth daily., Disp: 30 tablet, Rfl: 5 .  aspirin EC 81 MG tablet, Take 1 tablet (81 mg total) by mouth daily., Disp: 90 tablet, Rfl: 3 .  carvedilol (COREG) 3.125 MG tablet, Take 1 tablet (3.125 mg total) by mouth 2 (two) times daily with a meal., Disp: 60 tablet, Rfl: 5 .  ferrous sulfate 220 (44 Fe) MG/5ML solution, Take 220 mg by mouth daily., Disp: , Rfl:  .  furosemide (LASIX) 40 MG tablet, Take 1 tablet (40 mg total) by mouth 2 (two) times daily., Disp: 60 tablet, Rfl: 5 .  indapamide (LOZOL) 2.5 MG tablet, Take 1 tablet (2.5 mg total) by mouth daily.  30 minutes prior to furosemide, Disp: 30 tablet, Rfl: 5 .  isosorbide mononitrate (ISMO) 10 MG tablet, Take 1 tablet (10 mg total) by mouth daily., Disp: 30 tablet, Rfl: 5 .  multivitamin-iron-minerals-folic acid (CENTRUM) chewable tablet, Chew 1 tablet by mouth daily., Disp: , Rfl:  .  pantoprazole (PROTONIX) 40 MG tablet, Take 1 tablet (40 mg total) by mouth daily., Disp: 30 tablet, Rfl: 5 .  sacubitril-valsartan (ENTRESTO) 24-26 MG, Take 2 tablets by mouth 2 (two) times daily., Disp: 60 tablet, Rfl: 3  EXAM: This was a  telehealth telephone visit and thus no physical exam was completed.   ASSESSMENT AND PLAN:  Discussed the following assessment and plan:  Chronic respiratory failure with hypoxia ([name]) Overall stable.  Discussed that he needs to use 2 L of oxygen at rest and 4 L with ambulation.  We will get information from the home health company to see if we can transfer his oxygen supplies to a company in [place].  Information was given to our clinical RN Kerin Salen.  Bedbug bite They are getting the bedbugs treated.  No significant issues related to this.    I discussed the assessment and treatment plan with the patient. The patient was provided an opportunity to ask questions and all were answered. The patient agreed with the plan and demonstrated an understanding of the instructions.   The patient was advised to call back or seek an in-person evaluation if the symptoms worsen or if the condition fails to improve as anticipated.   I provided 18 minutes of non-face-to-face time during this encounter.   [name], MD

## 2019-03-04 NOTE — Telephone Encounter (Signed)
Copied from [place] [id number]. Topic: General - Inquiry >> Mar 04, 2019 10:23 AM Oneta Rack wrote: Reason for CRM: Alex Larson  daughter returning Juliann Pulse call (daughter didn't want to elaborate what the recall was regarding) please advise

## 2019-03-05 NOTE — Telephone Encounter (Signed)
See clinical note concerning oxygen

## 2019-03-05 NOTE — Progress Notes (Signed)
Called patient previous 76 supplier and advised patient would need portable 02 tanks , spoke with ope she advised there as no way they could ship 02 tanks and the do not supply portable concentrators. Called [employer] and was advised that Alex Larson has never paid the bill for patient 02 that this was through patient old insurance. So, with patient attaining new insurance this is the same as patient never being on oxygen and we can file for new account. Called and explained to [name] and they are willing to take his account and will get new equipment out today and patient can call [facility] and have old concentrator shipped back to [place].  Patient will receive portable and stationary concentrator from [name].

## 2019-03-10 ENCOUNTER — Telehealth: Payer: Self-pay

## 2019-03-10 NOTE — Telephone Encounter (Signed)
Copied from CRM #[id number]. Topic: Appointment Scheduling - Prior Auth Required for Appointment >> Nov 14, 2018  3:14 PM McGee, Demi B, NT wrote: No appointment has been scheduled. Patient's daughter, Jacqualyn [place], is requesting New Patient appointment. Per scheduling protocol, this appointment requires a prior authorization prior to scheduling.  Advised patient's daughter, Jacqualyn McHenry one of Dr Sonnenberg's patient's, that he is not currently accepting new patients. Daughter very addiment that a message be sent to Dr Sonnenberg about the possibility of him accepting her father as a new patient. States that patient's Cardiologist gave her a paper that had a list of providers accepting new patients and she states that Dr Sonnenberg was on that list. Please advise.  CB#: [phone]  Route to department's PEC pool.- 

## 2019-04-03 ENCOUNTER — Ambulatory Visit: Payer: Self-pay | Admitting: Family Medicine

## 2019-04-21 ENCOUNTER — Ambulatory Visit: Payer: Medicare PPO | Admitting: Family

## 2019-04-21 NOTE — Progress Notes (Signed)
Patient ID: Alex Larson, male    DOB: [birth year], 85 y.o.   MRN: [id number]  HPI  Alex Larson is a 85 y/o male with a history of CAD, HTN, CKD, PAD and chronic heart failure.   Echo report from 10/28/2018 reviewed and showed an EF of 10-15% along with moderate/ severe TR.  Admitted 10/27/2018 due to heart failure along with chest pain. Cardiology, nephrology and palliative care consults obtained. He had been out of lasix for the previous 4 months. Initially given IV lasix and then transitioned to oral diuretics. Elevated troponin thought to be due to demand ischemia. Not on ACE-i or entresto due to renal failure. Discharged after 6 days.   He presents today for a follow-up visit with a chief complaint of minimal shortness of breath upon moderate exertion. He describes this as chronic in nature having been present for several years. He has associated slight weight gain and dark looking stools along with this. He denies any dizziness, abdominal distention, palpitations, pedal edema, chest pain, cough or fatigue.   He says that the dark looking stools have been present for "years" and he's been worked up for this in the past and has had been scoped without being able to find the source of the bleed. Has been taking iron supplements for years as well.   Past Medical History:  Diagnosis Date  . CAD (coronary artery disease)   . CHF (congestive heart failure) ([place])   . CKD (chronic kidney disease)   . Hypertension   . PAD (peripheral artery disease) ([place])    Past Surgical History:  Procedure Laterality Date  . BYPASS GRAFT     Right lower extremity  . CORONARY STENT INTERVENTION     x2 in [place].  Details unknown.  . REPLACEMENT TOTAL KNEE BILATERAL     Family History  Problem Relation Age of Onset  . Breast cancer Mother    Social History   Tobacco Use  . Smoking status: Former Research scientist (life sciences)  . Smokeless tobacco: Former Systems developer  . Tobacco comment: quit age 57, smoked from teenage years   Substance Use Topics  . Alcohol use: Not Currently   No Known Allergies  Prior to Admission medications   Medication Sig Start Date End Date Taking? Authorizing Provider  amLODipine (NORVASC) 5 MG tablet Take 1 tablet (5 mg total) by mouth daily. 11/04/18  Yes Darylene Price A, FNP  aspirin EC 81 MG tablet Take 1 tablet (81 mg total) by mouth daily. 02/24/19  Yes End, Harrell Gave, MD  carvedilol (COREG) 3.125 MG tablet Take 1 tablet (3.125 mg total) by mouth 2 (two) times daily with a meal. 11/04/18  Yes Darylene Price A, FNP  ferrous sulfate 220 (44 Fe) MG/5ML solution Take 220 mg by mouth daily.   Yes [provider]  furosemide (LASIX) 40 MG tablet Take 1 tablet (40 mg total) by mouth 2 (two) times daily. 11/04/18  Yes Barth Trella, Otila Kluver A, FNP  indapamide (LOZOL) 2.5 MG tablet Take 1 tablet (2.5 mg total) by mouth daily. 30 minutes prior to furosemide 11/04/18  Yes Tesla Keeler, Otila Kluver A, FNP  isosorbide mononitrate (ISMO) 10 MG tablet Take 1 tablet (10 mg total) by mouth daily. 11/04/18  Yes Shontay Wallner, Otila Kluver A, FNP  multivitamin-iron-minerals-folic acid (CENTRUM) chewable tablet Chew 1 tablet by mouth daily.   Yes [provider]  pantoprazole (PROTONIX) 40 MG tablet Take 1 tablet (40 mg total) by mouth daily. 11/04/18  Yes Tawney Vanorman, Aura Fey, FNP  sacubitril-valsartan (ENTRESTO)  24-26 MG Take 2 tablets by mouth 2 (two) times daily. 02/24/19  Yes End, Harrell Gave, MD     Review of Systems  Constitutional: Negative for appetite change and fatigue.  HENT: Negative for congestion, postnasal drip and sore throat.   Eyes: Negative.   Respiratory: Positive for shortness of breath (with moderate exertion). Negative for cough.   Cardiovascular: Negative for chest pain, palpitations and leg swelling.  Gastrointestinal: Negative for abdominal distention, abdominal pain and blood in stool.       Dark colored stool  Endocrine: Negative.   Genitourinary: Negative.   Musculoskeletal: Positive for arthralgias  (left hand).  Skin: Negative.   Allergic/Immunologic: Negative.   Neurological: Negative for dizziness and light-headedness.  Hematological: Negative for adenopathy. Does not bruise/bleed easily.  Psychiatric/Behavioral: Negative for dysphoric mood and sleep disturbance (sleeping on 1 pillow; wearing oxygen at 2L at bedtime). The patient is not nervous/anxious.    Vitals:   04/22/19 0910 04/22/19 0911  BP: 92/79 122/60  Pulse: 69   Resp: 16   SpO2: (!) 82% (!) 89%  Weight: 169 lb 3.2 oz (76.7 kg)   Height: 6\' 2"  (1.88 m)    Wt Readings from Last 3 Encounters:  04/22/19 169 lb 3.2 oz (76.7 kg)  03/04/19 164 lb (74.4 kg)  02/24/19 166 lb (75.3 kg)   Lab Results  Component Value Date   CREATININE 3.46 (H) 11/02/2018   CREATININE 3.69 (H) 11/01/2018   CREATININE 3.71 (H) 10/31/2018    Physical Exam Vitals signs and nursing note reviewed.  Constitutional:      Appearance: He is well-developed.  HENT:     Head: Normocephalic and atraumatic.  Neck:     Musculoskeletal: Normal range of motion.     Vascular: No JVD.  Cardiovascular:     Rate and Rhythm: Normal rate and regular rhythm.  Pulmonary:     Effort: Pulmonary effort is normal. No respiratory distress.     Breath sounds: No wheezing or rales.  Abdominal:     Palpations: Abdomen is soft.     Tenderness: There is no abdominal tenderness.  Musculoskeletal:        General: No tenderness.     Right lower leg: He exhibits no tenderness. No edema.     Left lower leg: He exhibits no tenderness. No edema.  Skin:    General: Skin is warm and dry.  Neurological:     General: No focal deficit present.     Mental Status: He is alert and oriented to person, place, and time.  Psychiatric:        Behavior: Behavior normal.    Assessment & Plan:  1. Chronic heart failure with reduced ejection fraction- - NYHA class II - euvolemic  - weighing daily and his daughter says that his weight has slowly increased during this  pandemic - reminded to call for an overnight weight gain of >2 pounds or a weekly weight gain of >5 pounds - weight up 3 pounds from last visit 2 months ago - not adding salt and is trying to eat low sodium foods - renal function does not allow for entresto titration or initiation of farxiga - saw cardiology (End) 02/23/2019 - consider titrating carvedilol if HR allows - palliative care saw patient 11/12/2018 - BNP 10/27/2018 was >4500.0  2: HTN- - BP low initially but was normal after recheck with a manual cuff - had telemedicine visit with PCP Caryl Bis) 03/04/2019 - BMP from 02/23/2019 reviewed and showed sodium  141, potassium 4.3, creatinine 3.19 and GFR 16   3: CKD stage 4- - saw nephrology ([place]) 04/06/2019  4: Dark stools- - hemoglobin 10/29/2018 was 11.4 - says this has been present for years with a work-up that didn't find anything - advised to follow-up with PCP regarding this  Medication list was reviewed.  Return in 3 months or sooner for any questions/problems before then.

## 2019-04-22 ENCOUNTER — Encounter: Payer: Self-pay | Admitting: Family

## 2019-04-22 ENCOUNTER — Ambulatory Visit: Payer: Medicare PPO | Attending: Family | Admitting: Family

## 2019-04-22 ENCOUNTER — Other Ambulatory Visit: Payer: Self-pay

## 2019-04-22 ENCOUNTER — Other Ambulatory Visit: Payer: Self-pay | Admitting: Family

## 2019-04-22 VITALS — BP 122/60 | HR 69 | Resp 16 | Ht 74.0 in | Wt 169.2 lb

## 2019-04-22 DIAGNOSIS — Z955 Presence of coronary angioplasty implant and graft: Secondary | ICD-10-CM | POA: Diagnosis not present

## 2019-04-22 DIAGNOSIS — I2510 Atherosclerotic heart disease of native coronary artery without angina pectoris: Secondary | ICD-10-CM | POA: Insufficient documentation

## 2019-04-22 DIAGNOSIS — N184 Chronic kidney disease, stage 4 (severe): Secondary | ICD-10-CM | POA: Diagnosis not present

## 2019-04-22 DIAGNOSIS — Z87891 Personal history of nicotine dependence: Secondary | ICD-10-CM | POA: Diagnosis not present

## 2019-04-22 DIAGNOSIS — Z803 Family history of malignant neoplasm of breast: Secondary | ICD-10-CM | POA: Diagnosis not present

## 2019-04-22 DIAGNOSIS — I130 Hypertensive heart and chronic kidney disease with heart failure and stage 1 through stage 4 chronic kidney disease, or unspecified chronic kidney disease: Secondary | ICD-10-CM | POA: Diagnosis not present

## 2019-04-22 DIAGNOSIS — Z79899 Other long term (current) drug therapy: Secondary | ICD-10-CM | POA: Diagnosis not present

## 2019-04-22 DIAGNOSIS — K921 Melena: Secondary | ICD-10-CM | POA: Diagnosis not present

## 2019-04-22 DIAGNOSIS — I739 Peripheral vascular disease, unspecified: Secondary | ICD-10-CM | POA: Diagnosis not present

## 2019-04-22 DIAGNOSIS — Z96653 Presence of artificial knee joint, bilateral: Secondary | ICD-10-CM | POA: Diagnosis not present

## 2019-04-22 DIAGNOSIS — I10 Essential (primary) hypertension: Secondary | ICD-10-CM

## 2019-04-22 DIAGNOSIS — Z7982 Long term (current) use of aspirin: Secondary | ICD-10-CM | POA: Diagnosis not present

## 2019-04-22 DIAGNOSIS — I5022 Chronic systolic (congestive) heart failure: Secondary | ICD-10-CM

## 2019-04-22 DIAGNOSIS — R195 Other fecal abnormalities: Secondary | ICD-10-CM

## 2019-04-22 MED ORDER — SACUBITRIL-VALSARTAN 24-26 MG PO TABS: 2.0000 | ORAL_TABLET | Freq: Two times a day (BID) | ORAL | 3 refills | Status: DC

## 2019-04-22 NOTE — Patient Instructions (Signed)
Continue weighing daily and call for an overnight weight gain of > 2 pounds or a weekly weight gain of >5 pounds. 

## 2019-04-23 ENCOUNTER — Other Ambulatory Visit: Payer: Self-pay | Admitting: Family

## 2019-04-23 ENCOUNTER — Telehealth: Payer: Self-pay

## 2019-04-23 MED ORDER — SACUBITRIL-VALSARTAN 49-51 MG PO TABS: 1.0000 | ORAL_TABLET | Freq: Two times a day (BID) | ORAL | 3 refills | Status: DC

## 2019-04-23 NOTE — Telephone Encounter (Signed)
Copied from CRM #[id number]. Topic: Appointment Scheduling - Prior Auth Required for Appointment >> Nov 14, 2018  3:14 PM McGee, Demi B, NT wrote: No appointment has been scheduled. Patient's daughter, Jacqualyn [place], is requesting New Patient appointment. Per scheduling protocol, this appointment requires a prior authorization prior to scheduling.  Advised patient's daughter, Jacqualyn McHenry one of Dr Sonnenberg's patient's, that he is not currently accepting new patients. Daughter very addiment that a message be sent to Dr Sonnenberg about the possibility of him accepting her father as a new patient. States that patient's Cardiologist gave her a paper that had a list of providers accepting new patients and she states that Dr Sonnenberg was on that list. Please advise.  CB#: [phone]  Route to department's PEC pool.- 

## 2019-05-09 ENCOUNTER — Other Ambulatory Visit: Payer: Self-pay | Admitting: Family

## 2019-05-13 ENCOUNTER — Ambulatory Visit (INDEPENDENT_AMBULATORY_CARE_PROVIDER_SITE_OTHER): Payer: Medicare PPO | Admitting: Nurse Practitioner

## 2019-05-13 ENCOUNTER — Ambulatory Visit: Payer: Medicare PPO | Admitting: Internal Medicine

## 2019-05-13 ENCOUNTER — Encounter: Payer: Self-pay | Admitting: Nurse Practitioner

## 2019-05-13 ENCOUNTER — Other Ambulatory Visit: Payer: Self-pay

## 2019-05-13 VITALS — BP 122/60 | HR 66 | Ht 74.0 in | Wt 167.0 lb

## 2019-05-13 DIAGNOSIS — I739 Peripheral vascular disease, unspecified: Secondary | ICD-10-CM

## 2019-05-13 DIAGNOSIS — I10 Essential (primary) hypertension: Secondary | ICD-10-CM

## 2019-05-13 DIAGNOSIS — I255 Ischemic cardiomyopathy: Secondary | ICD-10-CM

## 2019-05-13 DIAGNOSIS — I2510 Atherosclerotic heart disease of native coronary artery without angina pectoris: Secondary | ICD-10-CM

## 2019-05-13 DIAGNOSIS — E782 Mixed hyperlipidemia: Secondary | ICD-10-CM | POA: Diagnosis not present

## 2019-05-13 DIAGNOSIS — I5042 Chronic combined systolic (congestive) and diastolic (congestive) heart failure: Secondary | ICD-10-CM

## 2019-05-13 DIAGNOSIS — N184 Chronic kidney disease, stage 4 (severe): Secondary | ICD-10-CM

## 2019-05-13 MED ORDER — ATORVASTATIN CALCIUM 40 MG PO TABS: 40.0000 mg | ORAL_TABLET | Freq: Every day | ORAL | 3 refills | Status: AC

## 2019-05-13 NOTE — Patient Instructions (Signed)
Medication Instructions:  1- START Lipitor 1 tablet (40 mg total) once daily.  *If you need a refill on your cardiac medications before your next appointment, please call your pharmacy*  Lab Work: Your physician recommends that you return for lab work in: 6 weeks at the medical mall. You will need to be fasting. (Lipids, LFT) No appt is needed. Hours are M-F 7AM- 6 PM.  If you have labs (blood work) drawn today and your tests are completely normal, you will receive your results only by: Marland Kitchen MyChart Message (if you have MyChart) OR . A paper copy in the mail If you have any lab test that is abnormal or we need to change your treatment, we will call you to review the results.  Testing/Procedures: None ordered   Follow-Up: At [facility], you and your health needs are our priority.  As part of our continuing mission to provide you with exceptional heart care, we have created designated Provider Care Teams.  These Care Teams include your primary Cardiologist (physician) and Advanced Practice Providers (APPs -  Physician Assistants and Nurse Practitioners) who all work together to provide you with the care you need, when you need it.  Your next appointment:   4 month(s)  The format for your next appointment:   In Person  Provider:    You may see Nelva Bush, MD or Murray Hodgkins, NP.

## 2019-05-13 NOTE — Progress Notes (Signed)
Office Visit    Patient Name: Alex Larson Date of Encounter: 05/13/2019  Primary Care Provider:  Leone Haven, MD Primary Cardiologist:  Nelva Bush, MD  Chief Complaint    85 y/o ? w/ a h/o CAD status post PCI x2 (details unknown), HFrEF, stage III chronic kidney disease, peripheral arterial disease status post right lower extremity bypass, chronic respiratory failure with hypoxia, and hypertension, who presents for follow-up related to CAD and heart failure.  Past Medical History    Past Medical History:  Diagnosis Date  . CAD (coronary artery disease)   . CKD (chronic kidney disease), stage IV ([place])   . HFrEF (heart failure with reduced ejection fraction) ([place])    a. 10/2018 Echo: EF 10-15%, DD. Diff HK. RVSP 70.4 mmHg. Mildly dil LA. Mod dil RA. Mod-sev TR. Ao sclerosis w/o stenosis.  . Hypertension   . Ischemic cardiomyopathy   . PAD (peripheral artery disease) ([name])    a. s/p RLE bypass.   Past Surgical History:  Procedure Laterality Date  . BYPASS GRAFT     Right lower extremity  . CORONARY STENT INTERVENTION     x2 in [place].  Details unknown.  . REPLACEMENT TOTAL KNEE BILATERAL      Allergies  No Known Allergies  History of Present Illness    85 y/o ? w/ a h/o CAD status post PCI x2 (details unknown), HFrEF, stage III chronic kidney disease, peripheral arterial disease status post right lower extremity bypass, chronic respiratory failure with hypoxia, and hypertension.  In May 2020, he was admitted to [facility] with a 1 week history of lower extremity swelling and dyspnea.  He had moved from [place] to [place] about 4 months earlier.  Echocardiogram during admission showed an EF of 10 to 15%.  Given advanced age and comorbidities (CKD and PAD), cardiac catheterization was not pursued.  He has since been followed in heart failure clinic and is also followed closely by nephrology.  He was last seen here in Sept, at which time he  was stable.  He has since f/u in CHF clinic in Nov w/ note of 3 lbs wt gain, though volume was felt to be stable.  He says that he has been overeating but he and his daughter watches salt intake closely.  Today, he says he has been doing quite well.  He wears oxygen around-the-clock and does not typically experience dyspnea on exertion.  His weight is down 2 pounds since his heart failure clinic visit.  He denies chest pain, palpitations, PND, orthopnea, dizziness, syncope, edema, or early satiety.  Home Medications    Prior to Admission medications   Medication Sig Start Date End Date Taking? Authorizing Provider  amLODipine (NORVASC) 5 MG tablet Take 1 tablet (5 mg total) by mouth daily. 05/09/19  Yes Darylene Price A, FNP  aspirin EC 81 MG tablet Take 1 tablet (81 mg total) by mouth daily. 02/24/19  Yes End, Harrell Gave, MD  carvedilol (COREG) 3.125 MG tablet Take 1 tablet (3.125 mg total) by mouth 2 (two) times daily with a meal. 11/04/18  Yes Darylene Price A, FNP  ferrous sulfate 220 (44 Fe) MG/5ML solution Take 220 mg by mouth daily.   Yes [provider]  furosemide (LASIX) 40 MG tablet Take 1 tablet (40 mg total) by mouth 2 (two) times daily. 11/04/18  Yes Hackney, Otila Kluver A, FNP  indapamide (LOZOL) 2.5 MG tablet Take 1 tablet (2.5 mg total) by mouth daily. 30 minutes  prior to furosemide 11/04/18  Yes Darylene Price A, FNP  isosorbide mononitrate (ISMO) 10 MG tablet Take 1 tablet (10 mg total) by mouth daily. 11/04/18  Yes Hackney, Otila Kluver A, FNP  multivitamin-iron-minerals-folic acid (CENTRUM) chewable tablet Chew 1 tablet by mouth daily.   Yes [provider]  pantoprazole (PROTONIX) 40 MG tablet Take 1 tablet (40 mg total) by mouth daily. 11/04/18  Yes Hackney, Tina A, FNP  sacubitril-valsartan (ENTRESTO) 49-51 MG Take 1 tablet by mouth 2 (two) times daily. 04/23/19  Yes Alisa Graff, FNP    Review of Systems    Doing well since his last visit.  He denies chest pain, palpitations,  dyspnea, pnd, orthopnea, n, v, dizziness, syncope, edema, weight gain, or early satiety.  All other systems reviewed and are otherwise negative except as noted above.  Physical Exam    VS:  BP 122/60 (BP Location: Left Arm, Patient Position: Sitting, Cuff Size: Normal)   Pulse 66   Ht 6\' 2"  (1.88 m)   Wt 167 lb (75.8 kg)   SpO2 97%   BMI 21.44 kg/m  , BMI Body mass index is 21.44 kg/m. GEN: Well nourished, well developed, in no acute distress. HEENT: normal. Neck: Supple, no JVD, carotid bruits, or masses. Cardiac: RRR, no murmurs, rubs, or gallops. No clubbing, cyanosis, edema.  Radials/PT 1+ and equal bilaterally.  Respiratory:  Respirations regular and unlabored, clear to auscultation bilaterally. GI: Soft, nontender, nondistended, BS + x 4. MS: no deformity or atrophy. Skin: warm and dry, no rash. Neuro:  Strength and sensation are intact. Psych: Normal affect.  Accessory Clinical Findings    ECG personally reviewed by me today -regular sinus rhythm, 66, PVCs, left axis deviation, left anterior fascicular block, right bundle branch block, LVH, abnormal R wave progression -no acute ST or T changes  Lab Results  Component Value Date   WBC 3.5 (L) 10/29/2018   HGB 11.4 (L) 10/29/2018   HCT 33.5 (L) 10/29/2018   MCV 85.0 10/29/2018   PLT 122 (L) 10/29/2018   Lab Results  Component Value Date   CREATININE 3.46 (H) 11/02/2018   BUN 84 (H) 11/02/2018   NA 137 11/02/2018   K 3.6 11/02/2018   CL 100 11/02/2018   CO2 25 11/02/2018   Lab Results  Component Value Date   ALT 23 10/27/2018   AST 34 10/27/2018   ALKPHOS 92 10/27/2018   BILITOT 1.7 (H) 10/27/2018   Lab Results  Component Value Date   CHOL 240 (H) 10/27/2018   HDL 45 10/27/2018   LDLCALC 172 (H) 10/27/2018   TRIG 117 10/27/2018   CHOLHDL 5.3 10/27/2018    Lab Results  Component Value Date   HGBA1C 5.7 (H) 10/27/2018    Assessment & Plan    1.  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  Patient has been doing well since his last cardiology visit in September and heart failure clinic visit in November.  He is wearing oxygen around-the-clock and does not typically experience dyspnea on exertion or edema.  He is careful with his salt intake and is weighing daily.  He remains on beta-blocker, Entresto, and Imdur, and is tolerating all well.  Renal function followed closely by nephrology.  Blood pressure stable today at 122/60.  Could consider addition of hydralazine if pressures higher in the future (or changing amlodipine to hydralazine).  No MRA secondary to CKD 4.  2.  Coronary  artery disease: Status post prior stenting though details are unclear.  He has not experienced any chest pain.  He remains on aspirin, beta-blocker, and nitrate.  We discussed his lipid profile from May in detail today.  His daughter was present for this discussion.  We collectively agreed to add atorvastatin 40 mg daily.  Plan to follow-up lipids and LFTs in 6 weeks.  3.  HL:  LDL 172 in May.  As above, adding atorvastatin today. f/u labs in 6 wks.   4.  Peripheral arterial disease: He denies claudication.  He remains on aspirin and I am adding statin as above.  5.  Chronic respiratory failure with hypoxia: Felt to be multifactorial.  He has been doing well and is now on around-the-clock oxygen-2 L/min.  6.  Stage IV chronic kidney disease: Followed closely by Dr. Juleen China.  Creatinine relatively stable at 3.66 in November.  7.  Essential hypertension: Stable on current regimen.  8.  Disposition: Follow-up lipids and LFTs in 6 weeks.  He will follow-up in February in heart failure clinic and we can arrange for follow-up here in about 4 months.  Murray Hodgkins, NP 05/13/2019, 2:32 PM

## 2019-05-21 ENCOUNTER — Other Ambulatory Visit: Payer: Self-pay | Admitting: Family

## 2019-05-21 MED ORDER — SACUBITRIL-VALSARTAN 49-51 MG PO TABS: 1.0000 | ORAL_TABLET | Freq: Two times a day (BID) | ORAL | 5 refills | Status: DC

## 2019-05-22 ENCOUNTER — Other Ambulatory Visit: Payer: Medicare PPO | Admitting: Adult Health Nurse Practitioner

## 2019-05-22 ENCOUNTER — Other Ambulatory Visit: Payer: Self-pay

## 2019-05-22 DIAGNOSIS — Z515 Encounter for palliative care: Secondary | ICD-10-CM

## 2019-05-22 DIAGNOSIS — I5042 Chronic combined systolic (congestive) and diastolic (congestive) heart failure: Secondary | ICD-10-CM

## 2019-05-22 NOTE — Progress Notes (Signed)
[facility] Consult Note Telephone: [phone]  Fax: [phone]  PATIENT NAME: Alex Larson DOB: [birth year] MRN: [id number]  PRIMARY CARE PROVIDER:   Leone Haven, MD  REFERRING PROVIDER:  Alisa Graff, FNP [address] [place],  [id number]  RESPONSIBLE PARTY:   Daughter, Georgia Lopes [phone]  Due to the COVID-19 crisis, this telephone evaluation and treatment contact was done via telephone and it was initiated and consent by this patient and or family.    RECOMMENDATIONS and PLAN:  1.  Advanced care planning.  Patient is a full code.  Daughter has stated that patient has expressed interest in filling out a living will.  Discussed MOST form briefly and has agreed have me mail a blank MOST form to her to review with her father.    Daughter states that he is getting such good care right now from his care team; which includes nephrology, cardiology, heart failure clinic.  States that she has no new concerns today and that he is doing really well for 85yo.  Has agreed to have palliative check in periodically to see if appointment needs to be set up.  Will call every couple of months for check in.    I spent 15 minutes providing this consultation,  from 12:30 to 12:45. More than 50% of the time in this consultation was spent coordinating communication.   HISTORY OF PRESENT ILLNESS:  Alex Larson is a 85 y.o. year old male with multiple medical problems including CHF, CAD, CKD, PAD, hypertension. Palliative Care was asked to help address goals of care.   CODE STATUS: full code  PPS: 0% HOSPICE ELIGIBILITY/DIAGNOSIS: TBD  PHYSICAL EXAM:   Deferred  PAST MEDICAL HISTORY:  Past Medical History:  Diagnosis Date  . CAD (coronary artery disease)   . CKD (chronic kidney disease), stage IV ([place])   . HFrEF (heart failure with reduced ejection fraction) ([place])    a. 10/2018 Echo: EF 10-15%, DD.  Diff HK. RVSP 70.4 mmHg. Mildly dil LA. Mod dil RA. Mod-sev TR. Ao sclerosis w/o stenosis.  . Hypertension   . Ischemic cardiomyopathy   . PAD (peripheral artery disease) ([name])    a. s/p RLE bypass.    SOCIAL HX:  Social History   Tobacco Use  . Smoking status: Former Research scientist (life sciences)  . Smokeless tobacco: Former Systems developer  . Tobacco comment: quit age 85, smoked from teenage years  Substance Use Topics  . Alcohol use: Not Currently    ALLERGIES: No Known Allergies   PERTINENT MEDICATIONS:  Outpatient Encounter Medications as of 05/22/2019  Medication Sig  . amLODipine (NORVASC) 5 MG tablet Take 1 tablet (5 mg total) by mouth daily.  Marland Kitchen aspirin EC 81 MG tablet Take 1 tablet (81 mg total) by mouth daily.  Marland Kitchen atorvastatin (LIPITOR) 40 MG tablet Take 1 tablet (40 mg total) by mouth daily.  . carvedilol (COREG) 3.125 MG tablet Take 1 tablet (3.125 mg total) by mouth 2 (two) times daily with a meal.  . ferrous sulfate 220 (44 Fe) MG/5ML solution Take 220 mg by mouth daily.  . furosemide (LASIX) 40 MG tablet Take 1 tablet (40 mg total) by mouth 2 (two) times daily.  . indapamide (LOZOL) 2.5 MG tablet Take 1 tablet (2.5 mg total) by mouth daily. 30 minutes prior to furosemide  . isosorbide mononitrate (ISMO) 10 MG tablet Take 1 tablet (10 mg total) by mouth daily.  . multivitamin-iron-minerals-folic acid (CENTRUM) chewable tablet  Chew 1 tablet by mouth daily.  . pantoprazole (PROTONIX) 40 MG tablet Take 1 tablet (40 mg total) by mouth daily.  . sacubitril-valsartan (ENTRESTO) 49-51 MG Take 1 tablet by mouth 2 (two) times daily.   No facility-administered encounter medications on file as of 05/22/2019.       Katha Kuehne Jenetta Downer, NP

## 2019-06-03 ENCOUNTER — Ambulatory Visit: Payer: Medicare PPO | Admitting: Family Medicine

## 2019-06-04 ENCOUNTER — Telehealth: Payer: Self-pay | Admitting: Family Medicine

## 2019-06-04 ENCOUNTER — Other Ambulatory Visit: Payer: Self-pay

## 2019-06-04 ENCOUNTER — Encounter: Payer: Self-pay | Admitting: Family Medicine

## 2019-06-04 ENCOUNTER — Ambulatory Visit (INDEPENDENT_AMBULATORY_CARE_PROVIDER_SITE_OTHER): Payer: Medicare PPO | Admitting: Family Medicine

## 2019-06-04 VITALS — Ht 74.0 in | Wt 167.0 lb

## 2019-06-04 DIAGNOSIS — N184 Chronic kidney disease, stage 4 (severe): Secondary | ICD-10-CM | POA: Diagnosis not present

## 2019-06-04 DIAGNOSIS — J9611 Chronic respiratory failure with hypoxia: Secondary | ICD-10-CM

## 2019-06-04 DIAGNOSIS — K219 Gastro-esophageal reflux disease without esophagitis: Secondary | ICD-10-CM

## 2019-06-04 DIAGNOSIS — I5042 Chronic combined systolic (congestive) and diastolic (congestive) heart failure: Secondary | ICD-10-CM

## 2019-06-04 DIAGNOSIS — E039 Hypothyroidism, unspecified: Secondary | ICD-10-CM

## 2019-06-04 NOTE — Progress Notes (Signed)
Virtual Visit via video Note  This visit type was conducted due to national recommendations for restrictions regarding the COVID-19 pandemic (e.g. social distancing).  This format is felt to be most appropriate for this patient at this time.  All issues noted in this document were discussed and addressed.  No physical exam was performed (except for noted visual exam findings with Video Visits).   I connected with Alex Larson today at  9:30 AM EST by a video enabled telemedicine application and verified that I am speaking with the correct person using two identifiers. Location patient: home Location provider: work Persons participating in the virtual visit: patient, provider, Georgia Lopes (daughter)  I discussed the limitations, risks, security and privacy concerns of performing an evaluation and management service by telephone and the availability of in person appointments. I also discussed with the patient that there may be a patient responsible charge related to this service. The patient expressed understanding and agreed to proceed.   Reason for visit: Follow-up.  HPI: CHF: Notes no dyspnea, chest pain, edema, orthopnea, or PND. He does have chronic respiratory failure that is likely multifactorial and he remains on 2 L nasal cannula. He notes doing quite well. He did follow-up with cardiology recently. Note reviewed. Lipitor started. They plan for lab work in the near future.  Hyperlipidemia: Currently on Lipitor. No myalgias or right upper quadrant pain.  CKD stage IV: Patient follows with nephrology. He had a visit in November. He had been taking some Advil at that time though he notes he is no longer taking Advil.  GERD: Currently on Protonix. He has no reflux symptoms. No abdominal pain. No blood in stool or dysphagia.   ROS: See pertinent positives and negatives per HPI.  Past Medical History:  Diagnosis Date  . CAD (coronary artery disease)   . CKD (chronic kidney  disease), stage IV (Alex Larson)   . HFrEF (heart failure with reduced ejection fraction) ([name])    a. 10/2018 Echo: EF 10-15%, DD. Diff HK. RVSP 70.4 mmHg. Mildly dil LA. Mod dil RA. Mod-sev TR. Ao sclerosis w/o stenosis.  . Hypertension   . Ischemic cardiomyopathy   . PAD (peripheral artery disease) ([name])    a. s/p RLE bypass.    Past Surgical History:  Procedure Laterality Date  . BYPASS GRAFT     Right lower extremity  . CORONARY STENT INTERVENTION     x2 in [place].  Details unknown.  . REPLACEMENT TOTAL KNEE BILATERAL      Family History  Problem Relation Age of Onset  . Breast cancer Mother     SOCIAL HX: Former smoker   Current Outpatient Medications:  .  amLODipine (NORVASC) 5 MG tablet, Take 1 tablet (5 mg total) by mouth daily., Disp: 90 tablet, Rfl: 3 .  aspirin EC 81 MG tablet, Take 1 tablet (81 mg total) by mouth daily., Disp: 90 tablet, Rfl: 3 .  atorvastatin (LIPITOR) 40 MG tablet, Take 1 tablet (40 mg total) by mouth daily., Disp: 90 tablet, Rfl: 3 .  carvedilol (COREG) 3.125 MG tablet, Take 1 tablet (3.125 mg total) by mouth 2 (two) times daily with a meal., Disp: 60 tablet, Rfl: 5 .  ferrous sulfate 220 (44 Fe) MG/5ML solution, Take 220 mg by mouth daily., Disp: , Rfl:  .  furosemide (LASIX) 40 MG tablet, Take 1 tablet (40 mg total) by mouth 2 (two) times daily., Disp: 60 tablet, Rfl: 5 .  indapamide (LOZOL) 2.5 MG tablet, Take 1  tablet (2.5 mg total) by mouth daily. 30 minutes prior to furosemide, Disp: 30 tablet, Rfl: 5 .  isosorbide mononitrate (ISMO) 10 MG tablet, Take 1 tablet (10 mg total) by mouth daily., Disp: 30 tablet, Rfl: 5 .  multivitamin-iron-minerals-folic acid (CENTRUM) chewable tablet, Chew 1 tablet by mouth daily., Disp: , Rfl:  .  pantoprazole (PROTONIX) 40 MG tablet, Take 1 tablet (40 mg total) by mouth daily., Disp: 30 tablet, Rfl: 5 .  sacubitril-valsartan (ENTRESTO) 49-51 MG, Take 1 tablet by mouth 2 (two) times daily., Disp: 60 tablet, Rfl:  5  EXAM:  VITALS per patient if applicable:  GENERAL: alert, oriented, appears well and in no acute distress  HEENT: atraumatic, conjunttiva clear, no obvious abnormalities on inspection of external nose and ears  NECK: normal movements of the head and neck  LUNGS: on inspection no signs of respiratory distress, breathing rate appears normal, no obvious gross SOB, gasping or wheezing  CV: no obvious cyanosis  MS: moves all visible extremities without noticeable abnormality  PSYCH/NEURO: pleasant and cooperative, no obvious depression or anxiety, speech and thought processing grossly intact  ASSESSMENT AND PLAN:  Discussed the following assessment and plan:  Chronic combined systolic and diastolic heart failure ([name]) Asymptomatic. He'll continue to follow with cardiology. We'll continue his current medication regimen.  Chronic respiratory failure with hypoxia ([name]) Stable. He'll continue oxygen 2 L by nasal cannula.  Hypothyroidism TSH was elevated previously. This could be in the setting of his prior illness. We will recheck with his upcoming labs.  CKD (chronic kidney disease) stage 4, GFR 15-29 ml/min ([name]) Continue to see nephrology. Discussed avoiding NSAIDs. If he needs something for pain he should take Tylenol.  GERD (gastroesophageal reflux disease) Asymptomatic on Protonix. He'll continue this medication.   Orders Placed This Encounter  Procedures  . TSH    Standing Status:   Future    Standing Expiration Date:   06/03/2020    No orders of the defined types were placed in this encounter.    I discussed the assessment and treatment plan with the patient. The patient was provided an opportunity to ask questions and all were answered. The patient agreed with the plan and demonstrated an understanding of the instructions.   The patient was advised to call back or seek an in-person evaluation if the symptoms worsen or if the condition fails to improve as  anticipated.    [name], MD

## 2019-06-04 NOTE — Telephone Encounter (Signed)
Please let the patient and his daughter know that I added a TSH to be checked during his lab work with the cardiologist. His TSH was elevated when he was hospitalized previously we need to recheck this. When they go to the lab at the hospital they need to inform them that they're having labs completed for me and his cardiologist. Thanks.

## 2019-06-04 NOTE — Assessment & Plan Note (Signed)
Stable. He'll continue oxygen 2 L by nasal cannula.

## 2019-06-04 NOTE — Assessment & Plan Note (Signed)
Asymptomatic on Protonix. He'll continue this medication.

## 2019-06-04 NOTE — Assessment & Plan Note (Signed)
TSH was elevated previously. This could be in the setting of his prior illness. We will recheck with his upcoming labs.

## 2019-06-04 NOTE — Telephone Encounter (Signed)
I called and informed the daughter that the provider was adding a [facility] to his labs that he is getting done by the cardiologist and I informed her that she needed to let them know at the lab. She understood.  Alex Larson,cma

## 2019-06-04 NOTE — Assessment & Plan Note (Signed)
Asymptomatic. He'll continue to follow with cardiology. We'll continue his current medication regimen.

## 2019-06-04 NOTE — Assessment & Plan Note (Signed)
Continue to see nephrology. Discussed avoiding NSAIDs. If he needs something for pain he should take Tylenol.

## 2019-06-05 ENCOUNTER — Other Ambulatory Visit: Payer: Self-pay | Admitting: Family

## 2019-06-08 ENCOUNTER — Other Ambulatory Visit: Payer: Medicare PPO

## 2019-06-10 ENCOUNTER — Telehealth: Payer: Self-pay | Admitting: Family Medicine

## 2019-06-10 ENCOUNTER — Other Ambulatory Visit: Payer: Self-pay

## 2019-06-10 MED ORDER — PANTOPRAZOLE SODIUM 40 MG PO TBEC: 40.0000 mg | DELAYED_RELEASE_TABLET | Freq: Every day | ORAL | 1 refills | Status: DC

## 2019-06-10 NOTE — Telephone Encounter (Signed)
Pt needs a refill on pantoprazole (PROTONIX) 40 MG tablet sent to [facility]

## 2019-06-10 NOTE — Telephone Encounter (Signed)
Medication sent to pharmacy per patient request.  Darika Ildefonso,cma  

## 2019-06-10 NOTE — Telephone Encounter (Signed)
Patient called and requested Protonix, sent to pharmacy.  Tyquez Hollibaugh,cma

## 2019-06-27 ENCOUNTER — Other Ambulatory Visit: Payer: Self-pay | Admitting: Family

## 2019-07-08 ENCOUNTER — Other Ambulatory Visit
Admission: RE | Admit: 2019-07-08 | Discharge: 2019-07-08 | Disposition: A | Discharge status: Home / Self Care | Payer: Medicare PPO | Source: Ambulatory Visit | Attending: Nurse Practitioner | Admitting: Nurse Practitioner

## 2019-07-08 ENCOUNTER — Telehealth: Payer: Self-pay

## 2019-07-08 DIAGNOSIS — I5042 Chronic combined systolic (congestive) and diastolic (congestive) heart failure: Secondary | ICD-10-CM | POA: Diagnosis present

## 2019-07-08 LAB — HEPATIC FUNCTION PANEL
ALT: 12 U/L (ref 0–44)
AST: 27 U/L (ref 15–41)
Albumin: 3.4 g/dL — ABNORMAL LOW (ref 3.5–5.0)
Alkaline Phosphatase: 47 U/L (ref 38–126)
Bilirubin, Direct: <0.1 mg/dL (ref 0.0–0.2)
Total Bilirubin: 0.9 mg/dL (ref 0.3–1.2)
Total Protein: 6.7 g/dL (ref 6.5–8.1)

## 2019-07-08 LAB — LIPID PANEL
Cholesterol: 187 mg/dL (ref 0–200)
HDL: 41 mg/dL (ref >40)
LDL Cholesterol: 119 mg/dL — ABNORMAL HIGH (ref 0–99)
Total CHOL/HDL Ratio: 4.6 RATIO
Triglycerides: 134 mg/dL (ref <150)
VLDL: 27 mg/dL (ref 0–40)

## 2019-07-08 NOTE — Telephone Encounter (Signed)
spoke to patient and daughter Alex Larson.   Reviewed labs and POC. No further orders at this time.   Advised pt to call for any further questions or concerns.

## 2019-07-08 NOTE — Telephone Encounter (Signed)
-----   Message from Theora Gianotti, NP sent at 07/08/2019 12:56 PM EST ----- Cholesterol numbers look good.  LDL at goal.  LFTs wnl.  Albumin (a form of circulating protein) is mildly low.

## 2019-07-16 ENCOUNTER — Other Ambulatory Visit: Payer: Self-pay

## 2019-07-16 ENCOUNTER — Inpatient Hospital Stay
Admission: EM | Admit: 2019-07-16 | Discharge: 2019-07-20 | DRG: 377 | Disposition: A | Discharge status: Skilled Nursing Facility | Payer: Medicare PPO | Attending: Internal Medicine | Admitting: Internal Medicine

## 2019-07-16 ENCOUNTER — Emergency Department: Payer: Medicare PPO

## 2019-07-16 DIAGNOSIS — K644 Residual hemorrhoidal skin tags: Secondary | ICD-10-CM | POA: Diagnosis present

## 2019-07-16 DIAGNOSIS — Z20822 Contact with and (suspected) exposure to covid-19: Secondary | ICD-10-CM | POA: Diagnosis present

## 2019-07-16 DIAGNOSIS — I509 Heart failure, unspecified: Secondary | ICD-10-CM

## 2019-07-16 DIAGNOSIS — I255 Ischemic cardiomyopathy: Secondary | ICD-10-CM | POA: Diagnosis present

## 2019-07-16 DIAGNOSIS — K5730 Diverticulosis of large intestine without perforation or abscess without bleeding: Secondary | ICD-10-CM | POA: Diagnosis present

## 2019-07-16 DIAGNOSIS — K922 Gastrointestinal hemorrhage, unspecified: Secondary | ICD-10-CM | POA: Diagnosis present

## 2019-07-16 DIAGNOSIS — D62 Acute posthemorrhagic anemia: Secondary | ICD-10-CM

## 2019-07-16 DIAGNOSIS — D649 Anemia, unspecified: Secondary | ICD-10-CM

## 2019-07-16 DIAGNOSIS — Z9981 Dependence on supplemental oxygen: Secondary | ICD-10-CM

## 2019-07-16 DIAGNOSIS — Z23 Encounter for immunization: Secondary | ICD-10-CM

## 2019-07-16 DIAGNOSIS — N179 Acute kidney failure, unspecified: Secondary | ICD-10-CM

## 2019-07-16 DIAGNOSIS — Z803 Family history of malignant neoplasm of breast: Secondary | ICD-10-CM

## 2019-07-16 DIAGNOSIS — K219 Gastro-esophageal reflux disease without esophagitis: Secondary | ICD-10-CM | POA: Diagnosis present

## 2019-07-16 DIAGNOSIS — I739 Peripheral vascular disease, unspecified: Secondary | ICD-10-CM | POA: Diagnosis present

## 2019-07-16 DIAGNOSIS — E43 Unspecified severe protein-calorie malnutrition: Secondary | ICD-10-CM | POA: Insufficient documentation

## 2019-07-16 DIAGNOSIS — E785 Hyperlipidemia, unspecified: Secondary | ICD-10-CM | POA: Diagnosis present

## 2019-07-16 DIAGNOSIS — I082 Rheumatic disorders of both aortic and tricuspid valves: Secondary | ICD-10-CM | POA: Diagnosis present

## 2019-07-16 DIAGNOSIS — K31811 Angiodysplasia of stomach and duodenum with bleeding: Secondary | ICD-10-CM | POA: Diagnosis not present

## 2019-07-16 DIAGNOSIS — R0602 Shortness of breath: Secondary | ICD-10-CM | POA: Diagnosis not present

## 2019-07-16 DIAGNOSIS — Z955 Presence of coronary angioplasty implant and graft: Secondary | ICD-10-CM

## 2019-07-16 DIAGNOSIS — I272 Pulmonary hypertension, unspecified: Secondary | ICD-10-CM | POA: Diagnosis present

## 2019-07-16 DIAGNOSIS — I440 Atrioventricular block, first degree: Secondary | ICD-10-CM | POA: Diagnosis present

## 2019-07-16 DIAGNOSIS — K921 Melena: Secondary | ICD-10-CM

## 2019-07-16 DIAGNOSIS — N185 Chronic kidney disease, stage 5: Secondary | ICD-10-CM | POA: Diagnosis present

## 2019-07-16 DIAGNOSIS — I890 Lymphedema, not elsewhere classified: Secondary | ICD-10-CM | POA: Diagnosis present

## 2019-07-16 DIAGNOSIS — Z79899 Other long term (current) drug therapy: Secondary | ICD-10-CM

## 2019-07-16 DIAGNOSIS — Z66 Do not resuscitate: Secondary | ICD-10-CM | POA: Diagnosis present

## 2019-07-16 DIAGNOSIS — K648 Other hemorrhoids: Secondary | ICD-10-CM | POA: Diagnosis present

## 2019-07-16 DIAGNOSIS — K31819 Angiodysplasia of stomach and duodenum without bleeding: Secondary | ICD-10-CM

## 2019-07-16 DIAGNOSIS — R778 Other specified abnormalities of plasma proteins: Secondary | ICD-10-CM

## 2019-07-16 DIAGNOSIS — N189 Chronic kidney disease, unspecified: Secondary | ICD-10-CM

## 2019-07-16 DIAGNOSIS — I4510 Unspecified right bundle-branch block: Secondary | ICD-10-CM | POA: Diagnosis present

## 2019-07-16 DIAGNOSIS — J9611 Chronic respiratory failure with hypoxia: Secondary | ICD-10-CM | POA: Diagnosis present

## 2019-07-16 DIAGNOSIS — E039 Hypothyroidism, unspecified: Secondary | ICD-10-CM | POA: Diagnosis present

## 2019-07-16 DIAGNOSIS — Z95828 Presence of other vascular implants and grafts: Secondary | ICD-10-CM

## 2019-07-16 DIAGNOSIS — Z7982 Long term (current) use of aspirin: Secondary | ICD-10-CM

## 2019-07-16 DIAGNOSIS — Z96653 Presence of artificial knee joint, bilateral: Secondary | ICD-10-CM | POA: Diagnosis present

## 2019-07-16 DIAGNOSIS — I5022 Chronic systolic (congestive) heart failure: Secondary | ICD-10-CM | POA: Diagnosis present

## 2019-07-16 DIAGNOSIS — I2510 Atherosclerotic heart disease of native coronary artery without angina pectoris: Secondary | ICD-10-CM | POA: Diagnosis present

## 2019-07-16 DIAGNOSIS — Z87891 Personal history of nicotine dependence: Secondary | ICD-10-CM

## 2019-07-16 DIAGNOSIS — D631 Anemia in chronic kidney disease: Secondary | ICD-10-CM | POA: Diagnosis present

## 2019-07-16 DIAGNOSIS — I132 Hypertensive heart and chronic kidney disease with heart failure and with stage 5 chronic kidney disease, or end stage renal disease: Secondary | ICD-10-CM | POA: Diagnosis present

## 2019-07-16 LAB — COMPREHENSIVE METABOLIC PANEL
ALT: 22 U/L (ref 0–44)
AST: 45 U/L — ABNORMAL HIGH (ref 15–41)
Albumin: 3.6 g/dL (ref 3.5–5.0)
Alkaline Phosphatase: 67 U/L (ref 38–126)
Anion gap: 14 (ref 5–15)
BUN: 87 mg/dL — ABNORMAL HIGH (ref 8–23)
CO2: 18 mmol/L — ABNORMAL LOW (ref 22–32)
Calcium: 10.3 mg/dL (ref 8.9–10.3)
Chloride: 108 mmol/L (ref 98–111)
Creatinine, Ser: 4.68 mg/dL — ABNORMAL HIGH (ref 0.61–1.24)
GFR calc Af Amer: 12 mL/min — ABNORMAL LOW (ref >60)
GFR calc non Af Amer: 10 mL/min — ABNORMAL LOW (ref >60)
Glucose, Bld: 134 mg/dL — ABNORMAL HIGH (ref 70–99)
Potassium: 4.7 mmol/L (ref 3.5–5.1)
Sodium: 140 mmol/L (ref 135–145)
Total Bilirubin: 0.9 mg/dL (ref 0.3–1.2)
Total Protein: 7.4 g/dL (ref 6.5–8.1)

## 2019-07-16 LAB — CBC
HCT: 21 % — ABNORMAL LOW (ref 39.0–52.0)
Hemoglobin: 6.4 g/dL — ABNORMAL LOW (ref 13.0–17.0)
MCH: 30.9 pg (ref 26.0–34.0)
MCHC: 30.5 g/dL (ref 30.0–36.0)
MCV: 101.4 fL — ABNORMAL HIGH (ref 80.0–100.0)
Platelets: 128 10*3/uL — ABNORMAL LOW (ref 150–400)
RBC: 2.07 MIL/uL — ABNORMAL LOW (ref 4.22–5.81)
RDW: 21.8 % — ABNORMAL HIGH (ref 11.5–15.5)
WBC: 4.8 10*3/uL (ref 4.0–10.5)
nRBC: 0.6 % — ABNORMAL HIGH (ref 0.0–0.2)

## 2019-07-16 LAB — TROPONIN I (HIGH SENSITIVITY): Troponin I (High Sensitivity): 53 ng/L — ABNORMAL HIGH (ref <18)

## 2019-07-16 NOTE — ED Triage Notes (Addendum)
Pt in with co shob that started today, denies any cough or fever. Denies any pain at this time, pt is on O2 at home for chf. Per daughter nephrologist called today and was told to come to ED due to hgb of 6. Pt has renal failure and they are discussing possible dialysis.

## 2019-07-17 ENCOUNTER — Encounter: Payer: Self-pay | Admitting: Family Medicine

## 2019-07-17 ENCOUNTER — Telehealth: Payer: Self-pay

## 2019-07-17 DIAGNOSIS — K31811 Angiodysplasia of stomach and duodenum with bleeding: Secondary | ICD-10-CM | POA: Diagnosis present

## 2019-07-17 DIAGNOSIS — I440 Atrioventricular block, first degree: Secondary | ICD-10-CM | POA: Diagnosis present

## 2019-07-17 DIAGNOSIS — I739 Peripheral vascular disease, unspecified: Secondary | ICD-10-CM | POA: Diagnosis present

## 2019-07-17 DIAGNOSIS — Z66 Do not resuscitate: Secondary | ICD-10-CM | POA: Diagnosis present

## 2019-07-17 DIAGNOSIS — I132 Hypertensive heart and chronic kidney disease with heart failure and with stage 5 chronic kidney disease, or end stage renal disease: Secondary | ICD-10-CM | POA: Diagnosis present

## 2019-07-17 DIAGNOSIS — J9611 Chronic respiratory failure with hypoxia: Secondary | ICD-10-CM | POA: Diagnosis present

## 2019-07-17 DIAGNOSIS — N179 Acute kidney failure, unspecified: Secondary | ICD-10-CM

## 2019-07-17 DIAGNOSIS — K5520 Angiodysplasia of colon without hemorrhage: Secondary | ICD-10-CM | POA: Diagnosis not present

## 2019-07-17 DIAGNOSIS — I082 Rheumatic disorders of both aortic and tricuspid valves: Secondary | ICD-10-CM | POA: Diagnosis present

## 2019-07-17 DIAGNOSIS — K921 Melena: Secondary | ICD-10-CM

## 2019-07-17 DIAGNOSIS — K31819 Angiodysplasia of stomach and duodenum without bleeding: Secondary | ICD-10-CM | POA: Diagnosis not present

## 2019-07-17 DIAGNOSIS — I4510 Unspecified right bundle-branch block: Secondary | ICD-10-CM | POA: Diagnosis present

## 2019-07-17 DIAGNOSIS — K922 Gastrointestinal hemorrhage, unspecified: Secondary | ICD-10-CM | POA: Diagnosis present

## 2019-07-17 DIAGNOSIS — I509 Heart failure, unspecified: Secondary | ICD-10-CM | POA: Diagnosis not present

## 2019-07-17 DIAGNOSIS — N189 Chronic kidney disease, unspecified: Secondary | ICD-10-CM

## 2019-07-17 DIAGNOSIS — K644 Residual hemorrhoidal skin tags: Secondary | ICD-10-CM | POA: Diagnosis present

## 2019-07-17 DIAGNOSIS — I272 Pulmonary hypertension, unspecified: Secondary | ICD-10-CM | POA: Diagnosis present

## 2019-07-17 DIAGNOSIS — Z20822 Contact with and (suspected) exposure to covid-19: Secondary | ICD-10-CM | POA: Diagnosis present

## 2019-07-17 DIAGNOSIS — I10 Essential (primary) hypertension: Secondary | ICD-10-CM

## 2019-07-17 DIAGNOSIS — N185 Chronic kidney disease, stage 5: Secondary | ICD-10-CM

## 2019-07-17 DIAGNOSIS — I5022 Chronic systolic (congestive) heart failure: Secondary | ICD-10-CM | POA: Diagnosis present

## 2019-07-17 DIAGNOSIS — D62 Acute posthemorrhagic anemia: Secondary | ICD-10-CM | POA: Diagnosis present

## 2019-07-17 DIAGNOSIS — E43 Unspecified severe protein-calorie malnutrition: Secondary | ICD-10-CM | POA: Insufficient documentation

## 2019-07-17 DIAGNOSIS — K5730 Diverticulosis of large intestine without perforation or abscess without bleeding: Secondary | ICD-10-CM | POA: Diagnosis present

## 2019-07-17 DIAGNOSIS — I255 Ischemic cardiomyopathy: Secondary | ICD-10-CM | POA: Diagnosis present

## 2019-07-17 DIAGNOSIS — Z9981 Dependence on supplemental oxygen: Secondary | ICD-10-CM | POA: Diagnosis not present

## 2019-07-17 DIAGNOSIS — D649 Anemia, unspecified: Secondary | ICD-10-CM | POA: Diagnosis not present

## 2019-07-17 DIAGNOSIS — N178 Other acute kidney failure: Secondary | ICD-10-CM | POA: Diagnosis not present

## 2019-07-17 DIAGNOSIS — K648 Other hemorrhoids: Secondary | ICD-10-CM | POA: Diagnosis present

## 2019-07-17 DIAGNOSIS — I2510 Atherosclerotic heart disease of native coronary artery without angina pectoris: Secondary | ICD-10-CM | POA: Diagnosis present

## 2019-07-17 DIAGNOSIS — I890 Lymphedema, not elsewhere classified: Secondary | ICD-10-CM | POA: Diagnosis present

## 2019-07-17 DIAGNOSIS — D631 Anemia in chronic kidney disease: Secondary | ICD-10-CM | POA: Diagnosis present

## 2019-07-17 DIAGNOSIS — R0602 Shortness of breath: Secondary | ICD-10-CM | POA: Diagnosis present

## 2019-07-17 DIAGNOSIS — Z23 Encounter for immunization: Secondary | ICD-10-CM | POA: Diagnosis present

## 2019-07-17 LAB — BRAIN NATRIURETIC PEPTIDE: B Natriuretic Peptide: 3427 pg/mL — ABNORMAL HIGH (ref 0.0–100.0)

## 2019-07-17 LAB — BASIC METABOLIC PANEL
Anion gap: 7 (ref 5–15)
BUN: 79 mg/dL — ABNORMAL HIGH (ref 8–23)
CO2: 24 mmol/L (ref 22–32)
Calcium: 10.2 mg/dL (ref 8.9–10.3)
Chloride: 108 mmol/L (ref 98–111)
Creatinine, Ser: 4.72 mg/dL — ABNORMAL HIGH (ref 0.61–1.24)
GFR calc Af Amer: 12 mL/min — ABNORMAL LOW (ref >60)
GFR calc non Af Amer: 10 mL/min — ABNORMAL LOW (ref >60)
Glucose, Bld: 125 mg/dL — ABNORMAL HIGH (ref 70–99)
Potassium: 4.5 mmol/L (ref 3.5–5.1)
Sodium: 139 mmol/L (ref 135–145)

## 2019-07-17 LAB — HEMOGLOBIN AND HEMATOCRIT, BLOOD
HCT: 22.4 % — ABNORMAL LOW (ref 39.0–52.0)
HCT: 22.9 % — ABNORMAL LOW (ref 39.0–52.0)
Hemoglobin: 7.2 g/dL — ABNORMAL LOW (ref 13.0–17.0)
Hemoglobin: 7.4 g/dL — ABNORMAL LOW (ref 13.0–17.0)

## 2019-07-17 LAB — RESPIRATORY PANEL BY RT PCR (FLU A&B, COVID)
Influenza A by PCR: NEGATIVE
Influenza B by PCR: NEGATIVE
SARS Coronavirus 2 by RT PCR: NEGATIVE

## 2019-07-17 LAB — CBC
HCT: 22.6 % — ABNORMAL LOW (ref 39.0–52.0)
Hemoglobin: 7.4 g/dL — ABNORMAL LOW (ref 13.0–17.0)
MCH: 31.5 pg (ref 26.0–34.0)
MCHC: 32.7 g/dL (ref 30.0–36.0)
MCV: 96.2 fL (ref 80.0–100.0)
Platelets: 189 10*3/uL (ref 150–400)
RBC: 2.35 MIL/uL — ABNORMAL LOW (ref 4.22–5.81)
RDW: 20 % — ABNORMAL HIGH (ref 11.5–15.5)
WBC: 4.4 10*3/uL (ref 4.0–10.5)
nRBC: 0 % (ref 0.0–0.2)

## 2019-07-17 LAB — PROTIME-INR
INR: 1.5 — ABNORMAL HIGH (ref 0.8–1.2)
Prothrombin Time: 17.6 seconds — ABNORMAL HIGH (ref 11.4–15.2)

## 2019-07-17 LAB — PREPARE RBC (CROSSMATCH)

## 2019-07-17 LAB — TROPONIN I (HIGH SENSITIVITY): Troponin I (High Sensitivity): 47 ng/L — ABNORMAL HIGH (ref <18)

## 2019-07-17 LAB — MAGNESIUM: Magnesium: 2.1 mg/dL (ref 1.7–2.4)

## 2019-07-17 LAB — TSH: TSH: 203 u[IU]/mL — ABNORMAL HIGH (ref 0.350–4.500)

## 2019-07-17 LAB — ABO/RH: ABO/RH(D): B NEG

## 2019-07-17 LAB — APTT: aPTT: 38 seconds — ABNORMAL HIGH (ref 24–36)

## 2019-07-17 MED ORDER — ONDANSETRON HCL 4 MG PO TABS: 4.0000 mg | ORAL_TABLET | Freq: Four times a day (QID) | ORAL | Status: DC | PRN

## 2019-07-17 MED ORDER — TRAZODONE HCL 50 MG PO TABS: 25.0000 mg | ORAL_TABLET | Freq: Every evening | ORAL | Status: DC | PRN

## 2019-07-17 MED ORDER — LEVOTHYROXINE SODIUM 50 MCG PO TABS
50.0000 ug | ORAL_TABLET | Freq: Every day | ORAL | Status: DC
  Administered 2019-07-18 – 2019-07-20 (×3): 50 ug via ORAL
  Filled 2019-07-17 (×3): qty 1

## 2019-07-17 MED ORDER — PANTOPRAZOLE SODIUM 40 MG IV SOLR: 40.0000 mg | INTRAVENOUS | Status: DC

## 2019-07-17 MED ORDER — ISOSORBIDE MONONITRATE 20 MG PO TABS
10.0000 mg | ORAL_TABLET | Freq: Every day | ORAL | Status: DC
  Administered 2019-07-19 – 2019-07-20 (×2): 10 mg via ORAL
  Filled 2019-07-17 (×3): qty 1

## 2019-07-17 MED ORDER — ATORVASTATIN CALCIUM 20 MG PO TABS
40.0000 mg | ORAL_TABLET | Freq: Every day | ORAL | Status: DC
  Administered 2019-07-17 – 2019-07-19 (×3): 40 mg via ORAL
  Filled 2019-07-17 (×3): qty 2

## 2019-07-17 MED ORDER — PANTOPRAZOLE SODIUM 40 MG IV SOLR: 40.0000 mg | Freq: Two times a day (BID) | INTRAVENOUS | Status: DC

## 2019-07-17 MED ORDER — FERROUS SULFATE 220 (44 FE) MG/5ML PO ELIX
220.0000 mg | ORAL_SOLUTION | Freq: Every day | ORAL | Status: DC
  Administered 2019-07-19 – 2019-07-20 (×2): 220 mg via ORAL
  Filled 2019-07-17 (×5): qty 5

## 2019-07-17 MED ORDER — SODIUM CHLORIDE 0.9 % IV SOLN: INTRAVENOUS | Status: DC

## 2019-07-17 MED ORDER — INFLUENZA VAC A&B SA ADJ QUAD 0.5 ML IM PRSY
0.5000 mL | PREFILLED_SYRINGE | INTRAMUSCULAR | Status: AC
  Administered 2019-07-19: 0.5 mL via INTRAMUSCULAR
  Filled 2019-07-17: qty 0.5

## 2019-07-17 MED ORDER — EPOETIN ALFA 10000 UNIT/ML IJ SOLN
20000.0000 [IU] | Freq: Once | INTRAMUSCULAR | Status: AC
  Administered 2019-07-17: 14:00:00 20000 [IU] via SUBCUTANEOUS
  Filled 2019-07-17 (×3): qty 2

## 2019-07-17 MED ORDER — CARVEDILOL 6.25 MG PO TABS
3.1250 mg | ORAL_TABLET | Freq: Two times a day (BID) | ORAL | Status: DC
  Administered 2019-07-19 (×2): 3.125 mg via ORAL
  Filled 2019-07-17 (×3): qty 1

## 2019-07-17 MED ORDER — SODIUM CHLORIDE 0.9 % IV SOLN
10.0000 mL/h | Freq: Once | INTRAVENOUS | Status: AC
  Administered 2019-07-17: 10 mL/h via INTRAVENOUS

## 2019-07-17 MED ORDER — SODIUM CHLORIDE 0.9 % IV SOLN
8.0000 mg/h | INTRAVENOUS | Status: DC
  Administered 2019-07-17 – 2019-07-18 (×4): 8 mg/h via INTRAVENOUS
  Filled 2019-07-17 (×4): qty 80

## 2019-07-17 MED ORDER — ADULT MULTIVITAMIN W/MINERALS CH
1.0000 | ORAL_TABLET | Freq: Every day | ORAL | Status: DC
  Administered 2019-07-19 – 2019-07-20 (×2): 1 via ORAL
  Filled 2019-07-17 (×3): qty 1

## 2019-07-17 MED ORDER — AMLODIPINE BESYLATE 5 MG PO TABS
5.0000 mg | ORAL_TABLET | Freq: Every day | ORAL | Status: DC
  Administered 2019-07-20: 10:00:00 5 mg via ORAL
  Filled 2019-07-17 (×2): qty 1

## 2019-07-17 MED ORDER — ONDANSETRON HCL 4 MG/2ML IJ SOLN: 4.0000 mg | Freq: Four times a day (QID) | INTRAMUSCULAR | Status: DC | PRN

## 2019-07-17 MED ORDER — PEG 3350-KCL-NA BICARB-NACL 420 G PO SOLR
4000.0000 mL | Freq: Once | ORAL | Status: AC
  Administered 2019-07-17: 18:00:00 4000 mL via ORAL
  Filled 2019-07-17: qty 4000

## 2019-07-17 MED ORDER — ACETAMINOPHEN 325 MG PO TABS: 650.0000 mg | ORAL_TABLET | Freq: Four times a day (QID) | ORAL | Status: DC | PRN

## 2019-07-17 MED ORDER — SACUBITRIL-VALSARTAN 49-51 MG PO TABS: 1.0000 | ORAL_TABLET | Freq: Two times a day (BID) | ORAL | Status: DC

## 2019-07-17 MED ORDER — ACETAMINOPHEN 650 MG RE SUPP: 650.0000 mg | Freq: Four times a day (QID) | RECTAL | Status: DC | PRN

## 2019-07-17 NOTE — ED Notes (Addendum)
ED TO INPATIENT HANDOFF REPORT  ED Nurse Name and Phone #:   Gershon Mussel RN  [phone]  S Name/Age/Gender Alex Larson 86 y.o. male Room/Bed: ED17A/ED17A  Code Status   Code Status: DNR  Home/SNF/Other Home Patient oriented to: self, place, time and situation Is this baseline? Yes   Triage Complete: Triage complete  Chief Complaint GI bleeding [K92.2]  Triage Note Pt in with co shob that started today, denies any cough or fever. Denies any pain at this time, pt is on O2 at home for chf. Per daughter nephrologist called today and was told to come to ED due to hgb of 6. Pt has renal failure and they are discussing possible dialysis.     Allergies No Known Allergies  Level of Care/Admitting Diagnosis ED Disposition    ED Disposition Condition [place] Area: [place] [[id number]]  Level of Care: Med-Surg [16]  Covid Evaluation: Asymptomatic Screening Protocol (No Symptoms)  Diagnosis: GI bleeding [id number]  Admitting Physician: Christel Mormon [id number]  Attending Physician: Christel Mormon [id number]  Estimated length of stay: past midnight tomorrow  Certification:: I certify this patient will need inpatient services for at least 2 midnights       B Medical/Surgery History Past Medical History:  Diagnosis Date  . CAD (coronary artery disease)   . CKD (chronic kidney disease), stage IV ([place])   . HFrEF (heart failure with reduced ejection fraction) ([place])    a. 10/2018 Echo: EF 10-15%, DD. Diff HK. RVSP 70.4 mmHg. Mildly dil LA. Mod dil RA. Mod-sev TR. Ao sclerosis w/o stenosis.  . Hypertension   . Ischemic cardiomyopathy   . PAD (peripheral artery disease) ([name])    a. s/p RLE bypass.   Past Surgical History:  Procedure Laterality Date  . BYPASS GRAFT     Right lower extremity  . CORONARY STENT INTERVENTION     x2 in [place].  Details unknown.  . REPLACEMENT TOTAL KNEE BILATERAL       A IV Location/Drains/Wounds Patient  Lines/Drains/Airways Status   Active Line/Drains/Airways    Name:   Placement date:   Placement time:   Site:   Days:   Peripheral IV 07/17/19 Left Antecubital   07/17/19    0028    Antecubital   less than 1          Intake/Output Last 24 hours No intake or output data in the 24 hours ending 07/17/19 0208  Labs/Imaging Results for orders placed or performed during the hospital encounter of 07/16/19 (from the past 48 hour(s))  CBC     Status: Abnormal   Collection Time: 07/16/19 11:07 PM  Result Value Ref Range   WBC 4.8 4.0 - 10.5 K/uL   RBC 2.07 (L) 4.22 - 5.81 MIL/uL   Hemoglobin 6.4 (L) 13.0 - 17.0 g/dL   HCT 21.0 (L) 39.0 - 52.0 %   MCV 101.4 (H) 80.0 - 100.0 fL   MCH 30.9 26.0 - 34.0 pg   MCHC 30.5 30.0 - 36.0 g/dL   RDW 21.8 (H) 11.5 - 15.5 %   Platelets 128 (L) 150 - 400 K/uL    Comment: PLATELET COUNT CONFIRMED BY SMEAR Immature Platelet Fraction may be clinically indicated, consider ordering this additional test [id number]    nRBC 0.6 (H) 0.0 - 0.2 %    Comment: Performed at [facility], [address] [place], [place] [address]  Comprehensive metabolic panel     Status: Abnormal  Collection Time: 07/16/19 11:07 PM  Result Value Ref Range   Sodium 140 135 - 145 mmol/L   Potassium 4.7 3.5 - 5.1 mmol/L   Chloride 108 98 - 111 mmol/L   CO2 18 (L) 22 - 32 mmol/L   Glucose, Bld 134 (H) 70 - 99 mg/dL   BUN 87 (H) 8 - 23 mg/dL   Creatinine, Ser 4.68 (H) 0.61 - 1.24 mg/dL   Calcium 10.3 8.9 - 10.3 mg/dL   Total Protein 7.4 6.5 - 8.1 g/dL   Albumin 3.6 3.5 - 5.0 g/dL   AST 45 (H) 15 - 41 U/L   ALT 22 0 - 44 U/L   Alkaline Phosphatase 67 38 - 126 U/L   Total Bilirubin 0.9 0.3 - 1.2 mg/dL   GFR calc non Af Amer 10 (L) >60 mL/min   GFR calc Af Amer 12 (L) >60 mL/min   Anion gap 14 5 - 15    Comment: Performed at [facility], [address], [place], [place] [address]  Troponin I (High Sensitivity)     Status: Abnormal   Collection Time:  07/16/19 11:07 PM  Result Value Ref Range   Troponin I (High Sensitivity) 53 (H) <18 ng/L    Comment: (NOTE) Elevated high sensitivity troponin I (hsTnI) values and significant  changes across serial measurements may suggest ACS but many other  chronic and acute conditions are known to elevate hsTnI results.  Refer to the "Links" section for chest pain algorithms and additional  guidance. Performed at [facility], [address] [place], [place] [address]   Brain natriuretic peptide     Status: Abnormal   Collection Time: 07/16/19 11:07 PM  Result Value Ref Range   B Natriuretic Peptide 3,427.0 (H) 0.0 - 100.0 pg/mL    Comment: Performed at [facility], [address] [place], [place] [address]  Respiratory Panel by RT PCR (Flu A&B, Covid) - Nasopharyngeal Swab     Status: None   Collection Time: 07/16/19 11:44 PM   Specimen: Nasopharyngeal Swab  Result Value Ref Range   SARS Coronavirus 2 by RT PCR NEGATIVE NEGATIVE    Comment: (NOTE) SARS-CoV-2 target nucleic acids are NOT DETECTED. The SARS-CoV-2 RNA is generally detectable in upper respiratoy specimens during the acute phase of infection. The lowest concentration of SARS-CoV-2 viral copies this assay can detect is 131 copies/mL. A negative result does not preclude SARS-Cov-2 infection and should not be used as the sole basis for treatment or other patient management decisions. A negative result may occur with  improper specimen collection/handling, submission of specimen other than nasopharyngeal swab, presence of viral mutation(s) within the areas targeted by this assay, and inadequate number of viral copies (<131 copies/mL). A negative result must be combined with clinical observations, patient history, and epidemiological information. The expected result is Negative. Fact Sheet for Patients:  PinkCheek.be Fact Sheet for Healthcare Providers:   GravelBags.it This test is not yet ap proved or cleared by the [place] FDA and  has been authorized for detection and/or diagnosis of SARS-CoV-2 by FDA under an Emergency Use Authorization (EUA). This EUA will remain  in effect (meaning this test can be used) for the duration of the COVID-19 declaration under Section 564(b)(1) of the Act, 21 U.S.C. section 360bbb-3(b)(1), unless the authorization is terminated or revoked sooner.    Influenza A by PCR NEGATIVE NEGATIVE   Influenza B by PCR NEGATIVE NEGATIVE    Comment: (NOTE) The Xpert Xpress SARS-CoV-2/FLU/RSV  assay is intended as an aid in  the diagnosis of influenza from Nasopharyngeal swab specimens and  should not be used as a sole basis for treatment. Nasal washings and  aspirates are unacceptable for Xpert Xpress SARS-CoV-2/FLU/RSV  testing. Fact Sheet for Patients: PinkCheek.be Fact Sheet for Healthcare Providers: GravelBags.it This test is not yet approved or cleared by the [place] FDA and  has been authorized for detection and/or diagnosis of SARS-CoV-2 by  FDA under an Emergency Use Authorization (EUA). This EUA will remain  in effect (meaning this test can be used) for the duration of the  Covid-19 declaration under Section 564(b)(1) of the Act, 21  U.S.C. section 360bbb-3(b)(1), unless the authorization is  terminated or revoked. Performed at [facility], [address] [place], [place] [address]   Prepare RBC     Status: None   Collection Time: 07/17/19 12:28 AM  Result Value Ref Range   Order Confirmation      ORDER PROCESSED BY BLOOD BANK Performed at [facility], [address] [place], [place] [address]   Type and screen [place]     Status: None   Collection Time: 07/17/19 12:28 AM  Result Value Ref Range   ABO/RH(D) B NEG    Antibody Screen NEG    Sample  Expiration      07/20/2019,2359 Performed at [facility], [address] [place], [place] [address]   Protime-INR     Status: Abnormal   Collection Time: 07/17/19 12:29 AM  Result Value Ref Range   Prothrombin Time 17.6 (H) 11.4 - 15.2 seconds   INR 1.5 (H) 0.8 - 1.2    Comment: (NOTE) INR goal varies based on device and disease states. Performed at [facility], [address] [place], [place] [address]   APTT     Status: Abnormal   Collection Time: 07/17/19 12:29 AM  Result Value Ref Range   aPTT 38 (H) 24 - 36 seconds    Comment:        IF BASELINE aPTT IS ELEVATED, SUGGEST PATIENT RISK ASSESSMENT BE USED TO DETERMINE APPROPRIATE ANTICOAGULANT THERAPY. Performed at [facility], [address] [place], [place] [address]   Magnesium     Status: None   Collection Time: 07/17/19 12:29 AM  Result Value Ref Range   Magnesium 2.1 1.7 - 2.4 mg/dL    Comment: Performed at [facility], [place],  [address]  Troponin I (High Sensitivity)     Status: Abnormal   Collection Time: 07/17/19 12:29 AM  Result Value Ref Range   Troponin I (High Sensitivity) 47 (H) <18 ng/L    Comment: (NOTE) Elevated high sensitivity troponin I (hsTnI) values and significant  changes across serial measurements may suggest ACS but many other  chronic and acute conditions are known to elevate hsTnI results.  Refer to the "Links" section for chest pain algorithms and additional  guidance. Performed at [facility], [address] [place],  [address]    DG Chest Portable 1 View  Result Date: 07/16/2019 CLINICAL DATA:  Shortness of breath EXAM: PORTABLE CHEST 1 VIEW COMPARISON:  10/28/2018 FINDINGS: Moderate cardiomegaly with diffuse bilateral interstitial opacity, unchanged. No pleural effusion or pneumothorax. IMPRESSION: Unchanged cardiomegaly and interstitial opacities, likely chronic interstitial lung disease. There  may be a component of superimposed pulmonary edema. Electronically Signed   By: Ulyses Jarred M.D.   On: 07/16/2019 23:45    Pending  Labs [employer] (From admission, onward)    Start     Ordered   07/17/19 [id number]  Basic metabolic panel  Tomorrow morning,   STAT     07/17/19 0138   07/17/19 0500  CBC  Tomorrow morning,   STAT     07/17/19 0138   07/17/19 0121  ABO/Rh  Once,   STAT     07/17/19 0121          Vitals/Pain Today's Vitals   07/16/19 2252 07/17/19 0045 07/17/19 0115 07/17/19 0200  BP: (!) 113/53 (!) 123/56 118/68 118/63  Pulse: (!) 52 (!) 42 (!) 40 (!) 46  Resp: 20 15 18 20   Temp: (!) 96.6 F (35.9 C)     TempSrc: Oral     SpO2:  92% 93% 97%  Weight:      Height:      PainSc:        Isolation Precautions No active isolations  Medications Medications  0.9 %  sodium chloride infusion (has no administration in time range)  amLODipine (NORVASC) tablet 5 mg (has no administration in time range)  atorvastatin (LIPITOR) tablet 40 mg (has no administration in time range)  carvedilol (COREG) tablet 3.125 mg (has no administration in time range)  isosorbide mononitrate (ISMO) tablet 10 mg (has no administration in time range)  sacubitril-valsartan (ENTRESTO) 49-51 mg per tablet (has no administration in time range)  ferrous sulfate 220 (44 Fe) MG/5ML solution 220 mg (has no administration in time range)  multivitamin-iron-minerals-folic acid (CENTRUM) chewable tablet 1 tablet (has no administration in time range)  pantoprazole (PROTONIX) 80 mg in sodium chloride 0.9 % 250 mL (0.32 mg/mL) infusion (has no administration in time range)  pantoprazole (PROTONIX) injection 40 mg (has no administration in time range)  0.9 %  sodium chloride infusion (has no administration in time range)  acetaminophen (TYLENOL) tablet 650 mg (has no administration in time range)    Or  acetaminophen (TYLENOL) suppository 650 mg (has no administration in time range)  traZODone (DESYREL)  tablet 25 mg (has no administration in time range)  ondansetron (ZOFRAN) tablet 4 mg (has no administration in time range)    Or  ondansetron (ZOFRAN) injection 4 mg (has no administration in time range)    Mobility walks with device Low fall risk   Focused Assessments Cardiac Assessment Handoff:    Lab Results  Component Value Date   TROPONINI 0.33 (HH) 10/27/2018   No results found for: DDIMER Does the Patient currently have chest pain? No     R Recommendations: See Admitting Provider Note  Report given to:  Stacy RN on 2C  Additional Notes:

## 2019-07-17 NOTE — Progress Notes (Signed)
[place], [place] 07/17/19  Subjective:   Hospital day # 0  Patient known to our practice from outpatient.  He was sent over for low hemoglobin of 6.0 noticed on February 9 Patient has received a blood transfusions He denies any nausea or vomiting.  He has lost nearly couple of pounds over the last several weeks No shortness of breath No leg edema    02/11 0701 - 02/12 0700 In: -  Out: 150 [Urine:150] Lab Results  Component Value Date   CREATININE 4.72 (H) 07/17/2019   CREATININE 4.68 (H) 07/16/2019   CREATININE 3.46 (H) 11/02/2018     Objective:  Vital signs in last 24 hours:  Temp:  [92.9 F (33.8 C)-97.5 F (36.4 C)] 94.9 F (34.9 C) (02/12 0900) Pulse Rate:  [40-58] 48 (02/12 0717) Resp:  [15-20] 16 (02/12 0717) BP: (105-128)/(50-68) 128/66 (02/12 0717) SpO2:  [90 %-100 %] 100 % (02/12 0717) Weight:  [75.5 kg-78 kg] 75.5 kg (02/12 0314)  Weight change:  Filed Weights   07/16/19 2249 07/17/19 0314  Weight: 78 kg 75.5 kg    Intake/Output:    Intake/Output Summary (Last 24 hours) at 07/17/2019 1035 Last data filed at 07/17/2019 1020 Gross per 24 hour  Intake 365 ml  Output 650 ml  Net -285 ml     Physical Exam: General:  Frail, elderly gentleman, laying in the bed  HEENT  anicteric, moist oral mucosa  Pulm/lungs  normal breathing effort, clear to auscultation  CVS/Heart  no rub  Abdomen:   Soft nontender  Extremities:  No peripheral edema  Neurologic:  Alert, able to answer question  Skin:  No acute rashes    Basic Metabolic Panel:  Recent Labs  Lab 07/16/19 2307 07/17/19 0029 07/17/19 0219  NA 140  --  139  K 4.7  --  4.5  CL 108  --  108  CO2 18*  --  24  GLUCOSE 134*  --  125*  BUN 87*  --  79*  CREATININE 4.68*  --  4.72*  CALCIUM 10.3  --  10.2  MG  --  2.1  --      CBC: Recent Labs  Lab 07/16/19 2307 07/17/19 0912  WBC 4.8 4.4  HGB 6.4* 7.4*  HCT 21.0* 22.6*  MCV 101.4* 96.2  PLT 128* 189       Lab Results  Component Value Date   HEPBSAG Negative 10/28/2018   HEPBSAB Non Reactive 10/28/2018   HEPBIGM Negative 10/28/2018      Microbiology:  Recent Results (from the past 240 hour(s))  Respiratory Panel by RT PCR (Flu A&B, Covid) - Nasopharyngeal Swab     Status: None   Collection Time: 07/16/19 11:44 PM   Specimen: Nasopharyngeal Swab  Result Value Ref Range Status   SARS Coronavirus 2 by RT PCR NEGATIVE NEGATIVE Final    Comment: (NOTE) SARS-CoV-2 target nucleic acids are NOT DETECTED. The SARS-CoV-2 RNA is generally detectable in upper respiratoy specimens during the acute phase of infection. The lowest concentration of SARS-CoV-2 viral copies this assay can detect is 131 copies/mL. A negative result does not preclude SARS-Cov-2 infection and should not be used as the sole basis for treatment or other patient management decisions. A negative result may occur with  improper specimen collection/handling, submission of specimen other than nasopharyngeal swab, presence of viral mutation(s) within the areas targeted by this assay, and inadequate number of viral copies (<131 copies/mL). A negative result must be combined  with clinical observations, patient history, and epidemiological information. The expected result is Negative. Fact Sheet for Patients:  PinkCheek.be Fact Sheet for Healthcare Providers:  GravelBags.it This test is not yet ap proved or cleared by the [place] FDA and  has been authorized for detection and/or diagnosis of SARS-CoV-2 by FDA under an Emergency Use Authorization (EUA). This EUA will remain  in effect (meaning this test can be used) for the duration of the COVID-19 declaration under Section 564(b)(1) of the Act, 21 U.S.C. section 360bbb-3(b)(1), unless the authorization is terminated or revoked sooner.    Influenza A by PCR NEGATIVE NEGATIVE Final   Influenza B by PCR  NEGATIVE NEGATIVE Final    Comment: (NOTE) The Xpert Xpress SARS-CoV-2/FLU/RSV assay is intended as an aid in  the diagnosis of influenza from Nasopharyngeal swab specimens and  should not be used as a sole basis for treatment. Nasal washings and  aspirates are unacceptable for Xpert Xpress SARS-CoV-2/FLU/RSV  testing. Fact Sheet for Patients: PinkCheek.be Fact Sheet for Healthcare Providers: GravelBags.it This test is not yet approved or cleared by the [place] FDA and  has been authorized for detection and/or diagnosis of SARS-CoV-2 by  FDA under an Emergency Use Authorization (EUA). This EUA will remain  in effect (meaning this test can be used) for the duration of the  Covid-19 declaration under Section 564(b)(1) of the Act, 21  U.S.C. section 360bbb-3(b)(1), unless the authorization is  terminated or revoked. Performed at [facility], [address] [place],  [address]     Coagulation Studies: Recent Labs    07/17/19 0029  LABPROT 17.6*  INR 1.5*    Urinalysis: No results for input(s): COLORURINE, LABSPEC, PHURINE, GLUCOSEU, HGBUR, BILIRUBINUR, KETONESUR, PROTEINUR, UROBILINOGEN, NITRITE, LEUKOCYTESUR in the last 72 hours.  Invalid input(s): APPERANCEUR    Imaging: DG Chest Portable 1 View  Result Date: 07/16/2019 CLINICAL DATA:  Shortness of breath EXAM: PORTABLE CHEST 1 VIEW COMPARISON:  10/28/2018 FINDINGS: Moderate cardiomegaly with diffuse bilateral interstitial opacity, unchanged. No pleural effusion or pneumothorax. IMPRESSION: Unchanged cardiomegaly and interstitial opacities, likely chronic interstitial lung disease. There may be a component of superimposed pulmonary edema. Electronically Signed   By: Ulyses Jarred M.D.   On: 07/16/2019 23:45     Medications:   . sodium chloride    . pantoprozole (PROTONIX) infusion 8 mg/hr (07/17/19 0349)   . amLODipine  5 mg Oral Daily  .  atorvastatin  40 mg Oral Daily  . carvedilol  3.125 mg Oral BID WC  . ferrous sulfate  220 mg Oral Daily  . [START ON 07/18/2019] influenza vaccine adjuvanted  0.5 mL Intramuscular Tomorrow-1000  . isosorbide mononitrate  10 mg Oral Daily  . multivitamin with minerals  1 tablet Oral Daily  . [START ON 07/20/2019] pantoprazole  40 mg Intravenous Q12H  . sacubitril-valsartan  1 tablet Oral BID   acetaminophen **OR** acetaminophen, ondansetron **OR** ondansetron (ZOFRAN) IV, traZODone  Assessment/ Plan:  86 y.o. male with coronary disease, vascular disease, nausea, chronic kidney disease, congestive heart failure with EF 10 to 15%   admitted on 07/16/2019 for Melena [K92.1] GI bleeding [K92.2] Elevated troponin level [R77.8] Symptomatic anemia [D64.9] Chronic heart failure, unspecified heart failure type ([place]) [I50.9] Acute renal failure superimposed on chronic kidney disease, unspecified CKD stage, unspecified acute renal failure type ([place]) [N17.9, N18.9]  2D echo May 2020: LVEF with severely reduced systolic function, LVEF 10 to 15%, severe pulmonary hypertension, right ventricle systolic pressure 70 mm  #  chronic kidney disease stage V Creatinine of 4.20/GFR 12 Current creatinine of 1.70/GFR 12.  Renal function at baseline Electrolytes and volume status are acceptable.  No acute indication for dialysis at present  In light of severe congestive heart failure and severe pulmonary hypertension, will need further discussion with family regarding dialysis versus nondialytic management   #Anemia of chronic kidney disease Guaiac positive in the emergency room GI evaluation is pending Given blood transfusion this admission Epogen 20,000 units subcu x 1    LOS: 0 Fidencia Mccloud 2/12/202110:35 AM  [place], [place]  Note: This note was prepared with Dragon dictation. Any transcription errors are unintentional

## 2019-07-17 NOTE — Plan of Care (Signed)
Patient is alert and oriented. Temperature was 94.8 rectally. Received orders for blanket warmer but there are none available so I bundled patient up with warm blankets and put one around his head. He is currently receiving a blood transfusion. Will continue to monitor.  Christene Slates

## 2019-07-17 NOTE — H&P (Signed)
[name] NAME: Alex Larson    MR#:  [id number]  DATE OF BIRTH:  1926-12-29  DATE OF ADMISSION:  07/16/2019  PRIMARY CARE PHYSICIAN: Leone Haven, MD   REQUESTING/REFERRING PHYSICIAN: Hinda Kehr, MD CHIEF COMPLAINT:   Chief Complaint  Patient presents with  . Shortness of Breath    HISTORY OF PRESENT ILLNESS:  Alex Larson  is a 86 y.o. African-American male with a known history of coronary artery disease, stage V chronic kidney disease, heart failure with reduced ejection fraction, hypertension and ischemic cardiomyopathy who presented to the emergency room with acute onset of worsening dyspnea since yesterday p.m. with no cough or wheezing.  He admitted to dyspnea on exertion with no chest pain or palpitations.  No fever or chills.  He has been having melena with no bright red bleeding per rectum.  He denied any nausea or vomiting or heartburn or diarrhea.  No dysuria, oliguria or hematuria or flank pain.  No other bleeding diathesis.  He follows with Dr. Juleen China for his advanced chronic kidney disease.  Upon presentation to the emergency room, his temperature was 96.6 with a heart rate of 52 and pulse oximetry was 91 on 2 L O2 by nasal cannula and later 100% on 3 L.  His temperature later on was down to 94 he was ordered [facility].  Labs revealed a BUN of 79 with creatinine 4.72 comparable to previous levels.  High-sensitivity troponin I was 47.  INR is 1.5 and PT 17.6 with PTT of 38.  CBC showed hemoglobin of 6.4 hematocrit 21 compared to 11.4 and 33.5 on 10/29/2018.  For which x-ray showed unchanged cardiomegaly interstitial opacities like chronic interstitial lung disease and there may be component of super imposed pulmonary edema.  Influenza antigens and COVID-19 PCR came back negative.  The patient was noted to have heme positive stools with melena upon rectal exam by the ER physician.  Dr. Candiss Norse was contacted about the patient  given the possible need for hemodialysis.  Recommended transfusion of 1 unit of packed red blood cells over 4 hours and the patient will be evaluated in a.m.  He is admitted to a medical monitored bed for further evaluation and management. PAST MEDICAL HISTORY:   Past Medical History:  Diagnosis Date  . CAD (coronary artery disease)   . CKD (chronic kidney disease), stage IV ([place])   . HFrEF (heart failure with reduced ejection fraction) ([place])    a. 10/2018 Echo: EF 10-15%, DD. Diff HK. RVSP 70.4 mmHg. Mildly dil LA. Mod dil RA. Mod-sev TR. Ao sclerosis w/o stenosis.  . Hypertension   . Ischemic cardiomyopathy   . PAD (peripheral artery disease) ([name])    a. s/p RLE bypass.    PAST SURGICAL HISTORY:   Past Surgical History:  Procedure Laterality Date  . BYPASS GRAFT     Right lower extremity  . CORONARY STENT INTERVENTION     x2 in [place].  Details unknown.  . REPLACEMENT TOTAL KNEE BILATERAL      SOCIAL HISTORY:   Social History   Tobacco Use  . Smoking status: Former Research scientist (life sciences)  . Smokeless tobacco: Former Systems developer  . Tobacco comment: quit age 47, smoked from teenage years  Substance Use Topics  . Alcohol use: Not Currently    FAMILY HISTORY:   Family History  Problem Relation Age of Onset  . Breast cancer Mother     DRUG ALLERGIES:  No Known Allergies  REVIEW OF SYSTEMS:   ROS As per history of present illness. All pertinent systems were reviewed above. Constitutional,  HEENT, cardiovascular, respiratory, GI, GU, musculoskeletal, neuro, psychiatric, endocrine,  integumentary and hematologic systems were reviewed and are otherwise  negative/unremarkable except for positive findings mentioned above in the HPI.   MEDICATIONS AT HOME:   Prior to Admission medications   Medication Sig Start Date End Date Taking? Authorizing Provider  amLODipine (NORVASC) 5 MG tablet Take 1 tablet (5 mg total) by mouth daily. 05/09/19  Yes Darylene Price A, FNP  aspirin EC 81 MG tablet  Take 1 tablet (81 mg total) by mouth daily. 02/24/19  Yes End, Harrell Gave, MD  atorvastatin (LIPITOR) 40 MG tablet Take 1 tablet (40 mg total) by mouth daily. 05/13/19 08/11/19 Yes Theora Gianotti, NP  carvedilol (COREG) 3.125 MG tablet Take 1 tablet (3.125 mg total) by mouth 2 (two) times daily with a meal. 11/04/18  Yes Darylene Price A, FNP  ferrous sulfate 220 (44 Fe) MG/5ML solution Take 220 mg by mouth daily.   Yes [provider]  furosemide (LASIX) 40 MG tablet Take 1 tablet (40 mg total) by mouth 2 (two) times daily. Patient taking differently: Take 80 mg by mouth 2 (two) times daily.  11/04/18  Yes Darylene Price A, FNP  isosorbide mononitrate (ISMO) 10 MG tablet Take 1 tablet by mouth once daily 06/27/19  Yes Hackney, [place] A, FNP  multivitamin-iron-minerals-folic acid (CENTRUM) chewable tablet Chew 1 tablet by mouth daily.   Yes [provider]  pantoprazole (PROTONIX) 40 MG tablet Take 1 tablet (40 mg total) by mouth daily. 06/10/19  Yes Leone Haven, MD  sacubitril-valsartan (ENTRESTO) 49-51 MG Take 1 tablet by mouth 2 (two) times daily. 05/21/19  Yes Hackney, Otila Kluver A, FNP  indapamide (LOZOL) 2.5 MG tablet Take 1 tablet (2.5 mg total) by mouth daily. 30 minutes prior to furosemide Patient not taking: Reported on 07/16/2019 11/04/18   Alisa Graff, FNP      VITAL SIGNS:  Blood pressure 118/63, pulse (!) 46, temperature (!) 96.6 F (35.9 C), temperature source Oral, resp. rate 20, height 6\' 2"  (1.88 m), weight 78 kg, SpO2 97 %.  PHYSICAL EXAMINATION:  Physical Exam  GENERAL:  86 y.o.-year-old African-American male patient lying in the bed with no acute distress, covered in blankets.Marland Kitchen  EYES: Pupils equal, round, reactive to light and accommodation. No scleral icterus.  Positive pallor.  Extraocular muscles intact.  HEENT: Head atraumatic, normocephalic. Oropharynx and nasopharynx clear.  NECK:  Supple, no jugular venous distention. No thyroid enlargement, no  tenderness.  LUNGS: Normal breath sounds bilaterally, no wheezing, rales,rhonchi or crepitation. No use of accessory muscles of respiration.  CARDIOVASCULAR: Regular rate and rhythm, S1, S2 normal. No murmurs, rubs, or gallops.  ABDOMEN: Soft, nondistended, nontender. Bowel sounds present. No organomegaly or mass.  Rectal exam deferred.  It revealed positive occult blood in the ER by the ER physician. EXTREMITIES: No pedal edema, cyanosis, or clubbing.  NEUROLOGIC: Cranial nerves II through XII are intact. Muscle strength 5/5 in all extremities. Sensation intact. Gait not checked.  PSYCHIATRIC: The patient is alert and oriented x 3.  Normal affect and good eye contact. SKIN: No obvious rash, lesion, or ulcer.   LABORATORY PANEL:   CBC Recent Labs  Lab 07/16/19 2307  WBC 4.8  HGB 6.4*  HCT 21.0*  PLT 128*   ------------------------------------------------------------------------------------------------------------------  Chemistries  Recent Labs  Lab 07/16/19 2307 07/17/19 0029  NA 140  --  K 4.7  --   CL 108  --   CO2 18*  --   GLUCOSE 134*  --   BUN 87*  --   CREATININE 4.68*  --   CALCIUM 10.3  --   MG  --  2.1  AST 45*  --   ALT 22  --   ALKPHOS 67  --   BILITOT 0.9  --    ------------------------------------------------------------------------------------------------------------------  Cardiac Enzymes No results for input(s): TROPONINI in the last 168 hours. ------------------------------------------------------------------------------------------------------------------  RADIOLOGY:  DG Chest Portable 1 View  Result Date: 07/16/2019 CLINICAL DATA:  Shortness of breath EXAM: PORTABLE CHEST 1 VIEW COMPARISON:  10/28/2018 FINDINGS: Moderate cardiomegaly with diffuse bilateral interstitial opacity, unchanged. No pleural effusion or pneumothorax. IMPRESSION: Unchanged cardiomegaly and interstitial opacities, likely chronic interstitial lung disease. There may be a  component of superimposed pulmonary edema. Electronically Signed   By: Ulyses Jarred M.D.   On: 07/16/2019 23:45      IMPRESSION AND PLAN:   1.  GI bleeding with subsequent acute blood loss anemia on top of chronic anemia. -The patient will be admitted to a medical monitored bed. -He was typed and crossmatch and will be transfused 1 unit of packed red blood cells over 4 hours. -We will follow serial hemoglobin and hematocrit. -The patient was given IV Protonix bolus and started on IV Protonix drip. -This could be related to upper GI etiology as in acute gastritis, duodenitis, gastric ulcers or erosions or bleeding AVMs.  Right colonic bleeding AVMs would be in the differential diagnosis. -His hemoglobin and hematocrit could have been likely deteriorating over the last several months especially with advanced chronic kidney disease as well. -We will defer further transfusion to nephrology as he may need hemodialysis. -I notified Dr. Vicente Males about the patient and Dr. Allen Norris will see him today.  2.  Stage V chronic kidney disease. -Dr. Candiss Norse was notified about the patient as mentioned above and he will be seen by Dr. Juleen China -He may need hemodialysis.  3.  Hypertension. -Coreg, Entresto and Norvasc will be continued.  4.  Dyslipidemia. -Statin therapy will be resumed.  5.  DVT prophylaxis. -Medical DVT prophylaxis is currently contraindicated due to GI bleeding. -The patient will be placed on SCDs.  6 GI prophylaxis. -This was addressed above with IV PPI therapy.  All the records are reviewed and case discussed with ED provider. The plan of care was discussed in details with the patient (and family). I answered all questions. The patient agreed to proceed with the above mentioned plan. Further management will depend upon hospital course.   CODE STATUS: Full code  TOTAL TIME TAKING CARE OF THIS PATIENT: 55 minutes.    Christel Mormon M.D on 07/17/2019 at 2:19 AM  [facility]    From 7 PM-7 AM, contact night-coverage [email or web address]  CC: Primary care physician; Leone Haven, MD   Note: This dictation was prepared with Dragon dictation along with smaller phrase technology. Any transcriptional errors that result from this process are unintentional.

## 2019-07-17 NOTE — Progress Notes (Signed)
Initial Nutrition Assessment  DOCUMENTATION CODES:   Severe malnutrition in context of chronic illness  INTERVENTION:  Once diet advances provide Ensure Enlive po BID, each supplement provides 350 kcal and 20 grams of protein.  Continue daily MVI.  NUTRITION DIAGNOSIS:   Severe Malnutrition related to chronic illness(CKD stage V, CHF) as evidenced by severe fat depletion, severe muscle depletion.  GOAL:   Patient will meet greater than or equal to 90% of their needs  MONITOR:   PO intake, Supplement acceptance, Diet advancement, Labs, Weight trends, I & O's  REASON FOR ASSESSMENT:   Malnutrition Screening Tool    ASSESSMENT:   86 year old male with PMHx of CAD, PAD, HTN, CKD stage V, CHF admitted with GI bleed.   -Plan is for EGD/colonoscopy tomorrow.  Met with patient at bedside. He reports his appetite has been decreased and he has been losing weight. He is unable to describe his usual intake. He has been NPO here in setting of GI bleed. His diet was advanced to clears this afternoon but patient reports that was just for his prep and he has not been given any other liquids to drink. He is amenable to drinking an oral nutrition supplement to help meet calorie/protein needs once diet advances.   Patient reports his UBW was 170-175 lbs and he has been losing weight over time. Weights in chart appear fairly stable for the past year. He is currently 75.5 kg (166.45 lbs).  Medications reviewed and include: levothyroxine, MVI daily, pantoprazole, ferrous sulfate 220 mg daily. Potassium WNL. No phosphorus level available.  Labs reviewed: BUN 79, Creatinine 4.72.  NUTRITION - FOCUSED PHYSICAL EXAM:    Most Recent Value  Orbital Region  Severe depletion  Upper Arm Region  Severe depletion  Thoracic and Lumbar Region  Moderate depletion  Buccal Region  Severe depletion  Temple Region  Severe depletion  Clavicle Bone Region  Severe depletion  Clavicle and Acromion Bone Region   Severe depletion  Scapular Bone Region  Severe depletion  Dorsal Hand  Moderate depletion  Patellar Region  Severe depletion  Anterior Thigh Region  Severe depletion  Posterior Calf Region  Severe depletion  Edema (RD Assessment)  None  Hair  Reviewed  Eyes  Reviewed  Mouth  Reviewed  Skin  Reviewed  Nails  Reviewed     Diet Order:   Diet Order            Diet clear liquid Room service appropriate? Yes; Fluid consistency: Thin  Diet effective now             EDUCATION NEEDS:   No education needs have been identified at this time  Skin:  Skin Assessment: Reviewed RN Assessment  Last BM:  07/17/2019 per chart  Height:   Ht Readings from Last 1 Encounters:  07/17/19 '6\' 2"'  (1.88 m)   Weight:   Wt Readings from Last 1 Encounters:  07/17/19 75.5 kg   Ideal Body Weight:  86.4 kg  BMI:  Body mass index is 21.37 kg/m.  Estimated Nutritional Needs:   Kcal:  1800-2000  Protein:  75-90 grams  Fluid:  1.8 L/day  Jacklynn Barnacle, MS, RD, LDN Pager number available on Amion

## 2019-07-17 NOTE — Progress Notes (Signed)
Patient's temperature dropped to 94.9. Notified MD and applied [facility]. Patient's temperature was rechecked 30 minutes later and was 97.6. Bair Hugger turned down to medium temperature. Continuing with yellow MEWS vital signs.

## 2019-07-17 NOTE — Progress Notes (Signed)
Patient is currently followed by [facility] community Palliative program at home. TOC Stephanie Bowen made aware. Thank you. Flo Shanks BSN, RN, [place] [phone]

## 2019-07-17 NOTE — Consult Note (Signed)
Lucilla Lame, MD [facility]  [address], [place], [place] [address] Phone: [phone] Fax : [phone]  Consultation  Referring Provider:     Dr. Posey Pronto Primary Care Physician:  Leone Haven, MD Primary Gastroenterologist: Althia Forts         Reason for Consultation:     Symptomatic anemia  Date of Admission:  07/16/2019 Date of Consultation:  07/17/2019         HPI:   Tylin Zuccaro is a 86 y.o. male who came to the hospital with shortness of breath.  The patient has a history of congestive heart failure and stage V chronic kidney disease.  The patient had worsening of his shortness of breath and has been reporting black stools without any bright red blood per rectum.  There is also no report of any nausea vomiting diarrhea constipation.  Patient follows with his kidney doctor Dr. Juleen China for his advanced kidney disease.  The patient was found to have a INR of 1.5 and elevated troponin 47 and a hematocrit of 21 with a hemoglobin of 6.4.  The patient's hemoglobin was 11.4 prior to admission.  The patient cannot recall when he last had an EGD and colonoscopy but reports that he thinks it was a few years ago when he was living in [place].  The patient denies any abdominal pain associated with his anemia and melena.  When asked about his melanotic stools he thought that it was caused by the vitamins he was taking. I am now being asked to see the patient for GI work-up for his symptomatic anemia with melena.  Past Medical History:  Diagnosis Date  . CAD (coronary artery disease)   . CKD (chronic kidney disease), stage IV ([place])   . HFrEF (heart failure with reduced ejection fraction) ([place])    a. 10/2018 Echo: EF 10-15%, DD. Diff HK. RVSP 70.4 mmHg. Mildly dil LA. Mod dil RA. Mod-sev TR. Ao sclerosis w/o stenosis.  . Hypertension   . Ischemic cardiomyopathy   . PAD (peripheral artery disease) ([name])    a. s/p RLE bypass.    Past Surgical History:  Procedure Laterality Date    . BYPASS GRAFT     Right lower extremity  . CORONARY STENT INTERVENTION     x2 in [place].  Details unknown.  . REPLACEMENT TOTAL KNEE BILATERAL      Prior to Admission medications   Medication Sig Start Date End Date Taking? Authorizing Provider  amLODipine (NORVASC) 5 MG tablet Take 1 tablet (5 mg total) by mouth daily. 05/09/19  Yes Darylene Price A, FNP  aspirin EC 81 MG tablet Take 1 tablet (81 mg total) by mouth daily. 02/24/19  Yes End, Harrell Gave, MD  atorvastatin (LIPITOR) 40 MG tablet Take 1 tablet (40 mg total) by mouth daily. 05/13/19 08/11/19 Yes Theora Gianotti, NP  carvedilol (COREG) 3.125 MG tablet Take 1 tablet (3.125 mg total) by mouth 2 (two) times daily with a meal. 11/04/18  Yes Darylene Price A, FNP  ferrous sulfate 220 (44 Fe) MG/5ML solution Take 220 mg by mouth daily.   Yes [provider]  furosemide (LASIX) 40 MG tablet Take 1 tablet (40 mg total) by mouth 2 (two) times daily. Patient taking differently: Take 80 mg by mouth 2 (two) times daily.  11/04/18  Yes Darylene Price A, FNP  isosorbide mononitrate (ISMO) 10 MG tablet Take 1 tablet by mouth once daily 06/27/19  Yes Hackney, Aura Fey, FNP  multivitamin-iron-minerals-folic acid (CENTRUM) chewable  tablet Chew 1 tablet by mouth daily.   Yes [provider]  pantoprazole (PROTONIX) 40 MG tablet Take 1 tablet (40 mg total) by mouth daily. 06/10/19  Yes Leone Haven, MD  sacubitril-valsartan (ENTRESTO) 49-51 MG Take 1 tablet by mouth 2 (two) times daily. 05/21/19  Yes Hackney, Otila Kluver A, FNP  indapamide (LOZOL) 2.5 MG tablet Take 1 tablet (2.5 mg total) by mouth daily. 30 minutes prior to furosemide Patient not taking: Reported on 07/16/2019 11/04/18   Alisa Graff, FNP    Family History  Problem Relation Age of Onset  . Breast cancer Mother      Social History   Tobacco Use  . Smoking status: Former Research scientist (life sciences)  . Smokeless tobacco: Former Systems developer  . Tobacco comment: quit age 86, smoked from  teenage years  Substance Use Topics  . Alcohol use: Not Currently  . Drug use: Never    Allergies as of 07/16/2019  . (No Known Allergies)    Review of Systems:    All systems reviewed and negative except where noted in HPI.   Physical Exam:  Vital signs in last 24 hours: Temp:  [92.9 F (33.8 C)-98.2 F (36.8 C)] 98.2 F (36.8 C) (02/12 1230) Pulse Rate:  [40-58] 51 (02/12 1230) Resp:  [15-20] 17 (02/12 1230) BP: (105-128)/(47-68) 106/47 (02/12 1230) SpO2:  [90 %-100 %] 98 % (02/12 1230) Weight:  [75.5 kg-78 kg] 75.5 kg (02/12 0314) Last BM Date: 07/17/19 General:   Pleasant, cooperative in NAD Head:  Normocephalic and atraumatic. Eyes:   No icterus.   Conjunctiva pink. PERRLA. Ears:  Normal auditory acuity. Neck:  Supple; no masses or thyroidomegaly Lungs: Respirations even and unlabored. Lungs clear to auscultation bilaterally.   No wheezes, crackles, or rhonchi.  Heart:  Regular rate and rhythm;  Without murmur, clicks, rubs or gallops Abdomen:  Soft, nondistended, nontender. Normal bowel sounds. No appreciable masses or hepatomegaly.  No rebound or guarding.  Rectal:  Not performed. Msk:  Symmetrical without gross deformities.    Extremities:  Without edema, cyanosis or clubbing. Neurologic:  Alert and oriented x3;  grossly normal neurologically. Skin:  Intact without significant lesions or rashes. Cervical Nodes:  No significant cervical adenopathy. Psych:  Alert and cooperative. Normal affect.  LAB RESULTS: Recent Labs    07/16/19 2307 07/17/19 0912 07/17/19 1323  WBC 4.8 4.4  --   HGB 6.4* 7.4* 7.2*  HCT 21.0* 22.6* 22.4*  PLT 128* 189  --    BMET Recent Labs    07/16/19 2307 07/17/19 0219  NA 140 139  K 4.7 4.5  CL 108 108  CO2 18* 24  GLUCOSE 134* 125*  BUN 87* 79*  CREATININE 4.68* 4.72*  CALCIUM 10.3 10.2   LFT Recent Labs    07/16/19 2307  PROT 7.4  ALBUMIN 3.6  AST 45*  ALT 22  ALKPHOS 67  BILITOT 0.9   PT/INR Recent Labs     07/17/19 0029  LABPROT 17.6*  INR 1.5*    STUDIES: DG Chest Portable 1 View  Result Date: 07/16/2019 CLINICAL DATA:  Shortness of breath EXAM: PORTABLE CHEST 1 VIEW COMPARISON:  10/28/2018 FINDINGS: Moderate cardiomegaly with diffuse bilateral interstitial opacity, unchanged. No pleural effusion or pneumothorax. IMPRESSION: Unchanged cardiomegaly and interstitial opacities, likely chronic interstitial lung disease. There may be a component of superimposed pulmonary edema. Electronically Signed   By: Ulyses Jarred M.D.   On: 07/16/2019 23:45      Impression / Plan:  Assessment: Active Problems:   GI bleeding   Jishnu Lankford is a 86 y.o. y/o male with who is being seen for symptomatic anemia with profound drop in his hemoglobin.  The patient has been having black stools.  He denies any abdominal pain nausea or vomiting.  The patient was documented to have heme positive stools.  Plan: Discussed the case with hospitalist Dr. Posey Pronto and the patient.  We are going to proceed with an EGD and colonoscopy for tomorrow.  The patient has been explained the plan and agrees with it.  He will be prepped today and Dr. Marius Ditch will be covering this weekend and will perform the procedures.  Thank you for involving me in the care of this patient.      LOS: 0 days   Lucilla Lame, MD  07/17/2019, 2:08 PM Pager [phone] 7am-5pm  Check AMION for 5pm -7am coverage and on weekends   Note: This dictation was prepared with Dragon dictation along with smaller phrase technology. Any transcriptional errors that result from this process are unintentional.

## 2019-07-17 NOTE — Progress Notes (Signed)
   07/17/19 1320  Clinical Encounter Type  Visited With Patient  Visit Type Initial  Referral From Chaplain  Consult/Referral To [place] encounter patient while he was laying in bed and nurse was getting blood. Patient engaged in a lively conversation. Patient was very pleasant to be with. Patient said I am Blessed and Highly Favored and I have no complaints. Patient talked about his children. Patient offered pastoral presence, empathy, and prayer.

## 2019-07-17 NOTE — Progress Notes (Signed)
[facility] NAME: Alex Larson    MR#:  [id number]  DATE OF BIRTH:  03-Sep-1926  SUBJECTIVE:   Patient was referred by his nephrologist with hemoglobin of six. Patient denies any active bleeding. His noticed some black alert stools but he is on iron pills. Patient told me he has a lot of issues with anemia in the past and has had G.I. workup also  Denies any abdominal pain denies taking excessive use of NSAIDs or any other blood thinners  Clear his temperature was 94.5 with soft blood pressure. Patient currently has all bear hugger. He denies any symptoms. REVIEW OF SYSTEMS:   Review of Systems  Constitutional: Negative for chills, fever and weight loss.  HENT: Negative for ear discharge, ear pain and nosebleeds.   Eyes: Negative for blurred vision, pain and discharge.  Respiratory: Negative for sputum production, shortness of breath, wheezing and stridor.   Cardiovascular: Negative for chest pain, palpitations, orthopnea and PND.  Gastrointestinal: Positive for melena. Negative for abdominal pain, diarrhea, nausea and vomiting.  Genitourinary: Negative for frequency and urgency.  Musculoskeletal: Negative for back pain and joint pain.  Neurological: Positive for weakness. Negative for sensory change, speech change and focal weakness.  Psychiatric/Behavioral: Negative for depression and hallucinations. The patient is not nervous/anxious.    Tolerating Diet: Tolerating PT:   DRUG ALLERGIES:  No Known Allergies  VITALS:  Blood pressure (!) 106/47, pulse (!) 51, temperature 98.2 F (36.8 C), temperature source Oral, resp. rate 17, height 6\' 2"  (1.88 m), weight 75.5 kg, SpO2 98 %.  PHYSICAL EXAMINATION:   Physical Exam  GENERAL:  86 y.o.-year-old patient lying in the bed with no acute distress. Pallor+ EYES: Pupils equal, round, reactive to light and accommodation. No scleral icterus.   HEENT: Head atraumatic, normocephalic.  Oropharynx and nasopharynx clear.  NECK:  Supple, no jugular venous distention. No thyroid enlargement, no tenderness.  LUNGS: Normal breath sounds bilaterally, no wheezing, rales, rhonchi. No use of accessory muscles of respiration.  CARDIOVASCULAR: S1, S2 normal. No murmurs, rubs, or gallops.  ABDOMEN: Soft, nontender, nondistended. Bowel sounds present. No organomegaly or mass.  EXTREMITIES: No cyanosis, clubbing or edema b/l.    NEUROLOGIC: Cranial nerves II through XII are intact. No focal Motor or sensory deficits b/l.   PSYCHIATRIC:  patient is alert and oriented x 3.  SKIN: No obvious rash, lesion, or ulcer.   LABORATORY PANEL:  CBC Recent Labs  Lab 07/17/19 0912 07/17/19 0912 07/17/19 1323  WBC 4.4  --   --   HGB 7.4*   < > 7.2*  HCT 22.6*   < > 22.4*  PLT 189  --   --    < > = values in this interval not displayed.    Chemistries  Recent Labs  Lab 07/16/19 2307 07/16/19 2307 07/17/19 0029 07/17/19 0219  NA 140   < >  --  139  K 4.7   < >  --  4.5  CL 108   < >  --  108  CO2 18*   < >  --  24  GLUCOSE 134*   < >  --  125*  BUN 87*   < >  --  79*  CREATININE 4.68*   < >  --  4.72*  CALCIUM 10.3   < >  --  10.2  MG  --   --  2.1  --   AST 45*  --   --   --  ALT 22  --   --   --   ALKPHOS 67  --   --   --   BILITOT 0.9  --   --   --    < > = values in this interval not displayed.   Cardiac Enzymes No results for input(s): TROPONINI in the last 168 hours. RADIOLOGY:  DG Chest Portable 1 View  Result Date: 07/16/2019 CLINICAL DATA:  Shortness of breath EXAM: PORTABLE CHEST 1 VIEW COMPARISON:  10/28/2018 FINDINGS: Moderate cardiomegaly with diffuse bilateral interstitial opacity, unchanged. No pleural effusion or pneumothorax. IMPRESSION: Unchanged cardiomegaly and interstitial opacities, likely chronic interstitial lung disease. There may be a component of superimposed pulmonary edema. Electronically Signed   By: Ulyses Jarred M.D.   On: 07/16/2019 23:45    ASSESSMENT AND PLAN:  Alex Larson  is a 86 y.o. African-American male with a known history of coronary artery disease, stage V chronic kidney disease, heart failure with reduced ejection fraction, hypertension and ischemic cardiomyopathy who presented to the emergency room with acute onset of worsening dyspnea since yesterday p.m. with no cough or wheezing. He saw Dr. Juleen China in clinic 2 days ago and lab results came back tonight which indicated a hemoglobin of 6 which is acutely decreased from prior  1.  GI bleeding with subsequent acute blood loss anemia on top of chronic anemia. -no active bleeding per pt -has had issues with melonotic stools however is on iron supplements also. -found to have hemoglobin of 6.0--- two unit blood transfusion-- 7.4-- 7.2 -hgb 11.2 in may 2020 - on IV Protonix drip. -hold asa -This could be related to upper GI etiology as in acute gastritis, duodenitis, gastric ulcers or erosions or bleeding AVMs.  Right colonic bleeding AVMs would be in the differential diagnosis. -Seen by G.I. Dr. Allen Norris-- EGD colonoscopy plan for tomorrow patient is in agreement  2.  Stage V chronic kidney disease. -Dr. Candiss Norse was notified about the patient as mentioned above and he will be seen by Dr. Juleen China  3.  Hypertension. -Coreg,Norvasc will be continued-- eating today blood pressure soft -d/c Entresto   4.  Dyslipidemia. -Statin therapy will be resumed.  5.  DVT prophylaxis. -Medical DVT prophylaxis is currently contraindicated due to GI bleeding. -The patient will be placed on SCDs.  6 GI prophylaxis. -This was addressed above with IV PPI therapy.   Procedures: Family communication : Consults : G.I. Discharge Disposition : home CODE STATUS: full DVT Prophylaxis : SCD Barriers to discharge: G.I. evaluation  TOTAL TIME TAKING CARE OF THIS PATIENT: *30* minutes.  >50% time spent on counselling and coordination of care  Note: This dictation was prepared  with Dragon dictation along with smaller phrase technology. Any transcriptional errors that result from this process are unintentional.  Fritzi Mandes M.D    [facility]   CC: Primary care physician; Leone Haven, MDPatient ID: Alex Larson, male   DOB: 22-Aug-1926, 86 y.o.   MRN: [id number]

## 2019-07-17 NOTE — ED Provider Notes (Signed)
[facility] Emergency Department Provider Note  ____________________________________________   First MD Initiated Contact with Patient 07/16/19 2325     (approximate)  I have reviewed the triage vital signs and the nursing notes.   HISTORY  Chief Complaint Shortness of Breath    HPI Alex Larson is a 86 y.o. male with a history as listed below which notably includes severe heart failure with an estimated ejection fraction of 10 to 15% on 2 L of oxygen at all times and chronic kidney disease managed by Dr. Juleen China with anticipation of needing to advance to dialysis.  He presents tonight for shortness of breath that has been gradually worsening over a period of time.  He saw Dr. Juleen China in clinic 2 days ago and lab results came  back tonight which indicated a hemoglobin of 6 which is acutely decreased from prior.  Dr. Juleen China talked with the patient's family who recommended he come to the emergency department for evaluation.  The patient states that his shortness of breath is worse with exertion and can be moderate at times.  He denies pain.  He denies fever/chills, sore throat, chest pain, nausea, vomiting, and abdominal pain.  He has had blood transfusions in the past and has no problem receiving blood products.  He was unaware of having any blood in his stool or dark stools.        Past Medical History:  Diagnosis Date  . CAD (coronary artery disease)   . CKD (chronic kidney disease), stage IV ([place])   . HFrEF (heart failure with reduced ejection fraction) ([place])    a. 10/2018 Echo: EF 10-15%, DD. Diff HK. RVSP 70.4 mmHg. Mildly dil LA. Mod dil RA. Mod-sev TR. Ao sclerosis w/o stenosis.  . Hypertension   . Ischemic cardiomyopathy   . PAD (peripheral artery disease) ([name])    a. s/p RLE bypass.    Patient Active Problem List   Diagnosis Date Noted  . GERD (gastroesophageal reflux disease) 06/04/2019  . Peripheral vascular disease, unspecified ([place])  02/24/2019  . Hypertension 02/24/2019  . History of anemia 11/29/2018  . Chronic respiratory failure with hypoxia ([place])   . Hypothyroidism   . Coronary artery disease involving native coronary artery of native heart without angina pectoris   . CKD (chronic kidney disease) stage 4, GFR 15-29 ml/min ([name])   . Chronic combined systolic and diastolic heart failure ([place]) 10/27/2018    Past Surgical History:  Procedure Laterality Date  . BYPASS GRAFT     Right lower extremity  . CORONARY STENT INTERVENTION     x2 in [place].  Details unknown.  . REPLACEMENT TOTAL KNEE BILATERAL      Prior to Admission medications   Medication Sig Start Date End Date Taking? Authorizing Provider  amLODipine (NORVASC) 5 MG tablet Take 1 tablet (5 mg total) by mouth daily. 05/09/19  Yes Darylene Price A, FNP  aspirin EC 81 MG tablet Take 1 tablet (81 mg total) by mouth daily. 02/24/19  Yes End, Harrell Gave, MD  atorvastatin (LIPITOR) 40 MG tablet Take 1 tablet (40 mg total) by mouth daily. 05/13/19 08/11/19 Yes Theora Gianotti, NP  carvedilol (COREG) 3.125 MG tablet Take 1 tablet (3.125 mg total) by mouth 2 (two) times daily with a meal. 11/04/18  Yes Darylene Price A, FNP  ferrous sulfate 220 (44 Fe) MG/5ML solution Take 220 mg by mouth daily.   Yes [provider]  furosemide (LASIX) 40 MG tablet Take 1 tablet (40 mg  total) by mouth 2 (two) times daily. Patient taking differently: Take 80 mg by mouth 2 (two) times daily.  11/04/18  Yes Darylene Price A, FNP  isosorbide mononitrate (ISMO) 10 MG tablet Take 1 tablet by mouth once daily 06/27/19  Yes Hackney, Joyce A, FNP  multivitamin-iron-minerals-folic acid (CENTRUM) chewable tablet Chew 1 tablet by mouth daily.   Yes [provider]  pantoprazole (PROTONIX) 40 MG tablet Take 1 tablet (40 mg total) by mouth daily. 06/10/19  Yes Leone Haven, MD  sacubitril-valsartan (ENTRESTO) 49-51 MG Take 1 tablet by mouth 2 (two) times daily. 05/21/19   Yes Hackney, Otila Kluver A, FNP  indapamide (LOZOL) 2.5 MG tablet Take 1 tablet (2.5 mg total) by mouth daily. 30 minutes prior to furosemide Patient not taking: Reported on 07/16/2019 11/04/18   Alisa Graff, FNP    Allergies Patient has no known allergies.  Family History  Problem Relation Age of Onset  . Breast cancer Mother     Social History Social History   Tobacco Use  . Smoking status: Former Research scientist (life sciences)  . Smokeless tobacco: Former Systems developer  . Tobacco comment: quit age 32, smoked from teenage years  Substance Use Topics  . Alcohol use: Not Currently  . Drug use: Never    Review of Systems Constitutional: No fever/chills Eyes: No visual changes. ENT: No sore throat. Cardiovascular: Denies chest pain. Respiratory: Acute on chronic shortness of breath. Gastrointestinal: No abdominal pain.  No nausea, no vomiting.  No diarrhea.  No constipation. Genitourinary: Negative for dysuria. Musculoskeletal: Negative for neck pain.  Negative for back pain. Integumentary: Negative for rash. Neurological: Negative for headaches, focal weakness or numbness.   ____________________________________________   PHYSICAL EXAM:  VITAL SIGNS: ED Triage Vitals  Enc Vitals Group     BP 07/16/19 2252 (!) 113/53     Pulse Rate 07/16/19 2252 (!) 52     Resp 07/16/19 2252 20     Temp 07/16/19 2252 (!) 96.6 F (35.9 C)     Temp Source 07/16/19 2252 Oral     SpO2 --      Weight 07/16/19 2249 78 kg (172 lb)     Height 07/16/19 2249 1.88 m (6\' 2" )     Head Circumference --      Peak Flow --      Pain Score 07/16/19 2249 0     Pain Loc --      Pain Edu? --      Excl. in [place]? --     Constitutional: Alert and oriented.  No distress while lying at rest, appears younger than chronological age. Eyes: Conjunctivae are normal.  Head: Atraumatic. Nose: No congestion/rhinnorhea. Mouth/Throat: Patient is wearing a mask. Neck: No stridor.  No meningeal signs.   Cardiovascular: Normal rate, regular  rhythm. Good peripheral circulation. Grossly normal heart sounds. Respiratory: Normal respiratory effort.  No retractions. Gastrointestinal: Soft and nontender. No distention.  Rectal exam is notable for melena, strongly Hemoccult positive. Musculoskeletal: No lower extremity tenderness nor edema. No gross deformities of extremities. Neurologic:  Normal speech and language. No gross focal neurologic deficits are appreciated.  Skin:  Skin is warm, dry and intact. Psychiatric: Mood and affect are normal. Speech and behavior are normal.  ____________________________________________   LABS (all labs ordered are listed, but only abnormal results are displayed)  Labs Reviewed  CBC - Abnormal; Notable for the following components:      Result Value   RBC 2.07 (*)    Hemoglobin 6.4 (*)  HCT 21.0 (*)    MCV 101.4 (*)    RDW 21.8 (*)    Platelets 128 (*)    nRBC 0.6 (*)    All other components within normal limits  COMPREHENSIVE METABOLIC PANEL - Abnormal; Notable for the following components:   CO2 18 (*)    Glucose, Bld 134 (*)    BUN 87 (*)    Creatinine, Ser 4.68 (*)    AST 45 (*)    GFR calc non Af Amer 10 (*)    GFR calc Af Amer 12 (*)    All other components within normal limits  BRAIN NATRIURETIC PEPTIDE - Abnormal; Notable for the following components:   B Natriuretic Peptide 3,427.0 (*)    All other components within normal limits  PROTIME-INR - Abnormal; Notable for the following components:   Prothrombin Time 17.6 (*)    INR 1.5 (*)    All other components within normal limits  APTT - Abnormal; Notable for the following components:   aPTT 38 (*)    All other components within normal limits  TROPONIN I (HIGH SENSITIVITY) - Abnormal; Notable for the following components:   Troponin I (High Sensitivity) 53 (*)    All other components within normal limits  RESPIRATORY PANEL BY RT PCR (FLU A&B, COVID)  MAGNESIUM  PREPARE RBC (CROSSMATCH)  TYPE AND SCREEN  ABO/RH   TROPONIN I (HIGH SENSITIVITY)   ____________________________________________  EKG  ED ECG REPORT I, Hinda Kehr, the attending physician, personally viewed and interpreted this ECG.  Date: 07/16/2019 EKG Time: 22: 52 Rate: 52 Rhythm: Sinus bradycardia with first-degree AV block QRS Axis: Left axis deviation Intervals: Right bundle branch block ST/T Wave abnormalities: Non-specific ST segment / T-wave changes, but no clear evidence of acute ischemia. Narrative Interpretation: no definitive evidence of acute ischemia; does not meet STEMI criteria.   ____________________________________________  RADIOLOGY I, Hinda Kehr, personally viewed and evaluated these images (plain radiographs) as part of my medical decision making, as well as reviewing the written report by the radiologist.  ED MD interpretation: Chronic lung disease, questionable superimposed pulmonary edema  Official radiology report(s): DG Chest Portable 1 View  Result Date: 07/16/2019 CLINICAL DATA:  Shortness of breath EXAM: PORTABLE CHEST 1 VIEW COMPARISON:  10/28/2018 FINDINGS: Moderate cardiomegaly with diffuse bilateral interstitial opacity, unchanged. No pleural effusion or pneumothorax. IMPRESSION: Unchanged cardiomegaly and interstitial opacities, likely chronic interstitial lung disease. There may be a component of superimposed pulmonary edema. Electronically Signed   By: Ulyses Jarred M.D.   On: 07/16/2019 23:45    ____________________________________________   PROCEDURES   Procedure(s) performed (including Critical Care):  .Critical Care Performed by: Hinda Kehr, MD Authorized by: Hinda Kehr, MD   Critical care provider statement:    Critical care time (minutes):  30   Critical care time was exclusive of:  Separately billable procedures and treating other patients   Critical care was necessary to treat or prevent imminent or life-threatening deterioration of the following conditions:  symptomatic anemia and active GI bleed requiring blood transfusion.   Critical care was time spent personally by me on the following activities:  Development of treatment plan with patient or surrogate, discussions with consultants, evaluation of patient's response to treatment, examination of patient, obtaining history from patient or surrogate, ordering and performing treatments and interventions, ordering and review of laboratory studies, ordering and review of radiographic studies, pulse oximetry, re-evaluation of patient's condition and review of old charts     ____________________________________________  INITIAL IMPRESSION / MDM / ASSESSMENT AND PLAN / ED COURSE  As part of my medical decision making, I reviewed the following data within the [place] notes reviewed and incorporated, Labs reviewed , EKG interpreted , Old chart reviewed, Radiograph reviewed , Discussed with admitting physician , Discussed with nephrologist, and reviewed Notes from prior ED visits   Differential diagnosis includes, but is not limited to, acute on chronic renal failure, acute on chronic heart failure, symptomatic anemia, melena (upper GI bleed), lower GI bleed.  Patient is in no distress in spite of his acute and chronic issues.  Worsening dyspnea is mostly with exertion.  Vital signs are stable.  Patient has melena which I believe accounts for his subacute drop in hemoglobin down to 6.4 in the ED tonight.  I will consult nephrology by phone to discuss the case but I anticipate admission to the hospitalist service, transfusing a unit of PRBCs over an extended period of time given his ejection fraction, and daytime consultation of nephrology and gastroenterology.  Patient is comfortable with this plan.  I will hold off on fluids given that he is hemodynamically stable and likely is a little bit volume overloaded from his renal failure and will need blood transfusion.      Clinical  Course as of Jul 16 116  Thu Jul 16, 2019  2342 Hemoglobin(!): 6.4 [CF]  Fri Jul 17, 2019  0008 I discussed the case by phone with Dr. Candiss Norse with the nephrology service.  He reviewed the information and recommended continuing with the plan of 1 unit of PRBC administration over about 4 hours and admission to the hospitalist service for nephrology and gastroenterology consults during the day.  I will await the results of the respiratory panel to consult the hospitalist given that that will help determine floor placement.   [CF]  0013 B Natriuretic Peptide(!): 3,427.0 [CF]  0016 Worsening creatinine, but given very low ejection fraction, I will hold off on normal saline in favor of the PRBC transfusion that he will be getting once it is crossmatched.  Creatinine(!): 4.68 [CF]  0058 Consulting hospitalist  SARS Coronavirus 2 by RT PCR: NEGATIVE [CF]  0114 Discussed case with Dr. Sidney Ace who will admit.   [CF]    Clinical Course User Index [CF] Hinda Kehr, MD     ____________________________________________  FINAL CLINICAL IMPRESSION(S) / ED DIAGNOSES  Final diagnoses:  Symptomatic anemia  Melena  Acute renal failure superimposed on chronic kidney disease, unspecified CKD stage, unspecified acute renal failure type ([place])  Chronic heart failure, unspecified heart failure type ([name])  Elevated troponin level     MEDICATIONS GIVEN DURING THIS VISIT:  Medications  0.9 %  sodium chloride infusion (has no administration in time range)     ED Discharge Orders    None      *Please note:  Alex Larson was evaluated in Emergency Department on 07/17/2019 for the symptoms described in the history of present illness. He was evaluated in the context of the global COVID-19 pandemic, which necessitated consideration that the patient might be at risk for infection with the SARS-CoV-2 virus that causes COVID-19. Institutional protocols and algorithms that pertain to the evaluation of  patients at risk for COVID-19 are in a state of rapid change based on information released by regulatory bodies including the CDC and federal and state organizations. These policies and algorithms were followed during the patient's care in the ED.  Some ED evaluations and interventions may  be delayed as a result of limited staffing during the pandemic.*  Note:  This document was prepared using Dragon voice recognition software and may include unintentional dictation errors.   Hinda Kehr, MD 07/17/19 [phone]

## 2019-07-17 NOTE — Telephone Encounter (Signed)
Notified by Hanley Ben NP that patient was in the hospital. Hospital liaison team updated.

## 2019-07-18 ENCOUNTER — Inpatient Hospital Stay: Payer: Medicare PPO | Admitting: Anesthesiology

## 2019-07-18 ENCOUNTER — Encounter: Payer: Self-pay | Admitting: Family Medicine

## 2019-07-18 ENCOUNTER — Encounter
Admission: EM | Disposition: A | Discharge status: Skilled Nursing Facility | Payer: Self-pay | Source: Home / Self Care | Attending: Internal Medicine

## 2019-07-18 DIAGNOSIS — D649 Anemia, unspecified: Secondary | ICD-10-CM

## 2019-07-18 DIAGNOSIS — K921 Melena: Secondary | ICD-10-CM

## 2019-07-18 DIAGNOSIS — K5520 Angiodysplasia of colon without hemorrhage: Secondary | ICD-10-CM

## 2019-07-18 HISTORY — PX: COLONOSCOPY WITH PROPOFOL: SHX5780

## 2019-07-18 HISTORY — PX: ESOPHAGOGASTRODUODENOSCOPY (EGD) WITH PROPOFOL: SHX5813

## 2019-07-18 LAB — HEMOGLOBIN AND HEMATOCRIT, BLOOD
HCT: 23.7 % — ABNORMAL LOW (ref 39.0–52.0)
Hemoglobin: 7.6 g/dL — ABNORMAL LOW (ref 13.0–17.0)

## 2019-07-18 LAB — IRON AND TIBC
Iron: 20 ug/dL — ABNORMAL LOW (ref 45–182)
Saturation Ratios: 8 % — ABNORMAL LOW (ref 17.9–39.5)
TIBC: 249 ug/dL — ABNORMAL LOW (ref 250–450)
UIBC: 229 ug/dL

## 2019-07-18 LAB — PREPARE RBC (CROSSMATCH)

## 2019-07-18 LAB — FOLATE: Folate: 26 ng/mL (ref >5.9)

## 2019-07-18 LAB — GLUCOSE, CAPILLARY: Glucose-Capillary: 74 mg/dL (ref 70–99)

## 2019-07-18 LAB — FERRITIN: Ferritin: 120 ng/mL (ref 24–336)

## 2019-07-18 SURGERY — ESOPHAGOGASTRODUODENOSCOPY (EGD) WITH PROPOFOL
Anesthesia: General

## 2019-07-18 MED ORDER — LIDOCAINE HCL (CARDIAC) PF 100 MG/5ML IV SOSY
PREFILLED_SYRINGE | INTRAVENOUS | Status: DC | PRN
  Administered 2019-07-18: 50 mg via INTRAVENOUS

## 2019-07-18 MED ORDER — PROPOFOL 500 MG/50ML IV EMUL
INTRAVENOUS | Status: DC | PRN
  Administered 2019-07-18: 50 ug/kg/min via INTRAVENOUS

## 2019-07-18 MED ORDER — SODIUM CHLORIDE 0.9 % IV SOLN
INTRAVENOUS | Status: DC
  Administered 2019-07-18: 11:00:00 1000 mL via INTRAVENOUS

## 2019-07-18 MED ORDER — SODIUM CHLORIDE FLUSH 0.9 % IV SOLN
INTRAVENOUS | Status: AC
  Filled 2019-07-18: qty 10

## 2019-07-18 MED ORDER — FUROSEMIDE 10 MG/ML IJ SOLN
20.0000 mg | Freq: Once | INTRAMUSCULAR | Status: AC
  Administered 2019-07-18: 09:00:00 20 mg via INTRAVENOUS
  Filled 2019-07-18: qty 4

## 2019-07-18 MED ORDER — PROPOFOL 10 MG/ML IV BOLUS
INTRAVENOUS | Status: DC | PRN
  Administered 2019-07-18: 40 mg via INTRAVENOUS

## 2019-07-18 MED ORDER — EPHEDRINE SULFATE 50 MG/ML IJ SOLN
INTRAMUSCULAR | Status: DC | PRN
  Administered 2019-07-18: 10 mg via INTRAVENOUS

## 2019-07-18 NOTE — Plan of Care (Signed)

## 2019-07-18 NOTE — Progress Notes (Signed)
Will reattempt as time and pt allow.   07/18/19 1200  PT Visit Information  Last PT Received On 07/18/19  Reason Eval/Treat Not Completed Patient at procedure or test/unavailable   Mee Hives, PT MS Acute Rehab Dept. Number: [place] and [place]

## 2019-07-18 NOTE — Progress Notes (Signed)
PT Cancellation Note  Patient Details Name: Alex Larson MRN: [id number] DOB: 01/19/1927   Cancelled Treatment:    Reason Eval/Treat Not Completed: Medical issues which prohibited therapy, Patient was getting blood transfusion. Will attempt PT eval tomorrow.    Alanson Puls, [place] DPT 07/18/2019, 3:40 PM

## 2019-07-18 NOTE — Anesthesia Postprocedure Evaluation (Signed)
Anesthesia Post Note  Patient: Trampas Ravens  Procedure(s) Performed: ESOPHAGOGASTRODUODENOSCOPY (EGD) WITH PROPOFOL (N/A ) COLONOSCOPY WITH PROPOFOL (N/A )  Patient location during evaluation: PACU Anesthesia Type: General Level of consciousness: awake and alert Pain management: pain level controlled Vital Signs Assessment: post-procedure vital signs reviewed and stable Respiratory status: spontaneous breathing, nonlabored ventilation, respiratory function stable and patient connected to nasal cannula oxygen Cardiovascular status: blood pressure returned to baseline and stable Postop Assessment: no apparent nausea or vomiting Anesthetic complications: no     Last Vitals:  Vitals:   07/18/19 1533 07/18/19 1627  BP: (!) 124/54 (!) 121/59  Pulse: (!) 51 (!) 54  Resp: 20 16  Temp: (!) 35.1 C (!) 36.4 C  SpO2: 97% 94%    Last Pain:  Vitals:   07/18/19 1627  TempSrc: Oral  PainSc:                  Martha Clan

## 2019-07-18 NOTE — Progress Notes (Addendum)
Alex Larson at [facility] NAME: Alex Larson    MR#:  [id number]  DATE OF BIRTH:  03-03-1927  SUBJECTIVE:   Patient was referred by his nephrologist with hemoglobin of 6.0 Patient denies any active bleeding. Hss noticed some black alert stools but he is on iron pills. Patient told me he has a lot of issues with anemia in the past and has had G.I. workup also  Denies any abdominal pain denies taking excessive use of NSAIDs or any other blood thinners  Had prep for GI w/u last nite. No complaints REVIEW OF SYSTEMS:   Review of Systems  Constitutional: Negative for chills, fever and weight loss.  HENT: Negative for ear discharge, ear pain and nosebleeds.   Eyes: Negative for blurred vision, pain and discharge.  Respiratory: Negative for sputum production, shortness of breath, wheezing and stridor.   Cardiovascular: Negative for chest pain, palpitations, orthopnea and PND.  Gastrointestinal: Positive for melena. Negative for abdominal pain, diarrhea, nausea and vomiting.  Genitourinary: Negative for frequency and urgency.  Musculoskeletal: Negative for back pain and joint pain.  Neurological: Positive for weakness. Negative for sensory change, speech change and focal weakness.  Psychiatric/Behavioral: Negative for depression and hallucinations. The patient is not nervous/anxious.    Tolerating Diet:npo Tolerating PT: pending  DRUG ALLERGIES:  No Known Allergies  VITALS:  Blood pressure (!) 111/54, pulse (!) 50, temperature 97.6 F (36.4 C), temperature source Oral, resp. rate 20, height 6\' 2"  (1.88 m), weight 75.5 kg, SpO2 93 %.  PHYSICAL EXAMINATION:   Physical Exam  GENERAL:  86 y.o.-year-old patient lying in the bed with no acute distress. Pallor+, Thin EYES: Pupils equal, round, reactive to light and accommodation. No scleral icterus.   HEENT: Head atraumatic, normocephalic. Oropharynx and nasopharynx clear.  NECK:  Supple, no  jugular venous distention. No thyroid enlargement, no tenderness.  LUNGS: Normal breath sounds bilaterally, no wheezing, rales, rhonchi. No use of accessory muscles of respiration.  CARDIOVASCULAR: S1, S2 normal. No murmurs, rubs, or gallops.  ABDOMEN: Soft, nontender, nondistended. Bowel sounds present. No organomegaly or mass.  EXTREMITIES: No cyanosis, clubbing or edema b/l.    NEUROLOGIC: Cranial nerves II through XII are intact. No focal Motor or sensory deficits b/l.   PSYCHIATRIC:  patient is alert and oriented x 3.  SKIN: No obvious rash, lesion, or ulcer.   LABORATORY PANEL:  CBC Recent Labs  Lab 07/17/19 0912 07/17/19 1323 07/18/19 0221  WBC 4.4  --   --   HGB 7.4*   < > 7.6*  HCT 22.6*   < > 23.7*  PLT 189  --   --    < > = values in this interval not displayed.    Chemistries  Recent Labs  Lab 07/16/19 2307 07/16/19 2307 07/17/19 0029 07/17/19 0219  NA 140   < >  --  139  K 4.7   < >  --  4.5  CL 108   < >  --  108  CO2 18*   < >  --  24  GLUCOSE 134*   < >  --  125*  BUN 87*   < >  --  79*  CREATININE 4.68*   < >  --  4.72*  CALCIUM 10.3   < >  --  10.2  MG  --   --  2.1  --   AST 45*  --   --   --  ALT 22  --   --   --   ALKPHOS 67  --   --   --   BILITOT 0.9  --   --   --    < > = values in this interval not displayed.   Cardiac Enzymes No results for input(s): TROPONINI in the last 168 hours. RADIOLOGY:  DG Chest Portable 1 View  Result Date: 07/16/2019 CLINICAL DATA:  Shortness of breath EXAM: PORTABLE CHEST 1 VIEW COMPARISON:  10/28/2018 FINDINGS: Moderate cardiomegaly with diffuse bilateral interstitial opacity, unchanged. No pleural effusion or pneumothorax. IMPRESSION: Unchanged cardiomegaly and interstitial opacities, likely chronic interstitial lung disease. There may be a component of superimposed pulmonary edema. Electronically Signed   By: Ulyses Jarred M.D.   On: 07/16/2019 23:45   ASSESSMENT AND PLAN:  Alex Larson  is a 86 y.o.  African-American male with a known history of coronary artery disease, stage V chronic kidney disease, heart failure with reduced ejection fraction, hypertension and ischemic cardiomyopathy who presented to the emergency room with acute onset of worsening dyspnea since yesterday p.m. with no cough or wheezing. He saw Dr. Juleen China in clinic 2 days ago and lab results came back tonight which indicated a hemoglobin of 6 which is acutely decreased from prior  1.  GI bleeding with subsequent acute blood loss anemia on top of chronic anemia. -no active bleeding per pt -has had issues with melonotic stools however is on iron supplements also. -found to have hemoglobin of 6.0--- two unit blood transfusion-- 7.4-- 7.2--7.6--2nd unit today (has severe cardiomyopathy and would like Hgb >8.0) -hgb 11.2 in may 2020 - on IV Protonix drip. -hold asa --Seen by G.I. Dr. Allen Norris-- EGD/colonoscopy plan for today patient is in agreement  2.  Stage V chronic kidney disease. -seen by Nephrology--to d/w pt and dter given severe CHF (EF 10%) whether they would like HD or cont med mnx  3.  Hypertension. -Coreg,Norvasc --holding Entresto with creat so high  4. Acute on chronicSevere Cardiomyopathy/ Chronic Ischemic cardiomyopathy EF 10-15% by echo may 2020 - [facility] cardiology -LAsix 20 mg IV x1 today with elevated BNP 3400 and severe anemia -on chronic home oxygen -cont coreg and norvasc -enteresto on hold (high creat) -?spironolactone - on chronic home oxygen  5.  Dyslipidemia. -Statin therapy will be resumed.  6.  DVT prophylaxis. -Medical DVT prophylaxis is currently contraindicated due to GI bleeding. -The patient will be placed on SCDs.  7. Nutrition Status: Nutrition Problem: Severe Malnutrition Etiology: chronic illness(CKD stage V, CHF) Signs/Symptoms: severe fat depletion, severe muscle depletion Interventions: Refer to RD note for recommendations   Procedures: Family  communication :dter [name] on the phone Consults : G.I. Discharge Disposition : home CODE STATUS: FULL Code-- d/w HCPOA dter DVT Prophylaxis : SCD Barriers to discharge: G.I. evaluation  TOTAL TIME TAKING CARE OF THIS PATIENT: *30* minutes.  >50% time spent on counselling and coordination of care  Note: This dictation was prepared with Dragon dictation along with smaller phrase technology. Any transcriptional errors that result from this process are unintentional.  Fritzi Mandes M.D    [facility]   CC: Primary care physician; Leone Haven, MDPatient ID: [name], male   DOB: January 08, 1927, 86 y.o.   MRN: [id number]

## 2019-07-18 NOTE — Anesthesia Preprocedure Evaluation (Signed)
Anesthesia Evaluation  Patient identified by MRN, date of birth, ID band Patient awake    Reviewed: Allergy & Precautions, H&P , NPO status , Patient's Chart, lab work & pertinent test results, reviewed documented beta blocker date and time   History of Anesthesia Complications Negative for: history of anesthetic complications  Airway Mallampati: II  TM Distance: >3 FB Neck ROM: full    Dental  (+) Edentulous Upper, Edentulous Lower   Pulmonary neg pulmonary ROS, former smoker,    Pulmonary exam normal        Cardiovascular Exercise Tolerance: Good hypertension, (-) angina+ CAD, + Cardiac Stents, + CABG, + Peripheral Vascular Disease and + DOE  Normal cardiovascular exam(-) dysrhythmias + Valvular Problems/Murmurs      Neuro/Psych negative neurological ROS  negative psych ROS   GI/Hepatic Neg liver ROS, GERD  ,  Endo/Other  Hypothyroidism   Renal/GU CRFRenal disease  negative genitourinary   Musculoskeletal   Abdominal   Peds  Hematology  (+) Blood dyscrasia, anemia ,   Anesthesia Other Findings Past Medical History: No date: CAD (coronary artery disease) No date: CKD (chronic kidney disease), stage IV ([name]) No date: HFrEF (heart failure with reduced ejection fraction) ([place])     Comment:  a. 10/2018 Echo: EF 10-15%, DD. Diff HK. RVSP 70.4 mmHg.               Mildly dil LA. Mod dil RA. Mod-sev TR. Ao sclerosis w/o               stenosis. No date: Hypertension No date: Ischemic cardiomyopathy No date: PAD (peripheral artery disease) ([name])     Comment:  a. s/p RLE bypass.   Reproductive/Obstetrics negative OB ROS                             Anesthesia Physical Anesthesia Plan  ASA: III  Anesthesia Plan: General   Post-op Pain Management:    Induction: Intravenous  PONV Risk Score and Plan: 2 and Propofol infusion and TIVA  Airway Management Planned: Natural Airway and Nasal  Cannula  Additional Equipment:   Intra-op Plan:   Post-operative Plan:   Informed Consent: I have reviewed the patients History and Physical, chart, labs and discussed the procedure including the risks, benefits and alternatives for the proposed anesthesia with the patient or authorized representative who has indicated his/her understanding and acceptance.     Dental Advisory Given  Plan Discussed with: Anesthesiologist, CRNA and Surgeon  Anesthesia Plan Comments:         Anesthesia Quick Evaluation

## 2019-07-18 NOTE — Transfer of Care (Signed)
Immediate Anesthesia Transfer of Care Note  Patient: Creston Klas  Procedure(s) Performed: ESOPHAGOGASTRODUODENOSCOPY (EGD) WITH PROPOFOL (N/A ) COLONOSCOPY WITH PROPOFOL (N/A )  Patient Location: PACU  Anesthesia Type:MAC  Level of Consciousness: awake, alert  and oriented  Airway & Oxygen Therapy: Patient Spontanous Breathing and Patient connected to face mask oxygen  Post-op Assessment: Report given to RN and Post -op Vital signs reviewed and unstable, Anesthesiologist notified  Post vital signs: Reviewed and stable  Last Vitals:  Vitals Value Taken Time  BP    Temp    Pulse 51 07/18/19 1303  Resp    SpO2 89 % 07/18/19 1303  Vitals shown include unvalidated device data.  Last Pain:  Vitals:   07/18/19 1239  TempSrc: (P) Tympanic  PainSc:          Complications: respiratory complications

## 2019-07-18 NOTE — Progress Notes (Signed)
Incentive spirometer done with patient.  Patient is alert and awake, however he states he feels a little sleepy.  Report given to [facility] on floor.

## 2019-07-18 NOTE — Op Note (Signed)
[facility] Patient Name: Alex Larson Procedure Date: 07/18/2019 10:36 AM MRN: [id number] Account #: [id number] Date of Birth: 07/26/1926 Admit Type: Outpatient Age: 86 Room: [facility] ENDO ROOM 4 Gender: Male Note Status: Finalized Procedure:             Upper GI endoscopy Indications:           Iron deficiency anemia secondary to chronic blood loss Providers:             Lin Landsman MD, MD Medicines:             Monitored Anesthesia Care Complications:         No immediate complications. Estimated blood loss: None. Procedure:             Pre-Anesthesia Assessment:                        - Prior to the procedure, a History and Physical was                         performed, and patient medications and allergies were                         reviewed. The patient is competent. The risks and                         benefits of the procedure and the sedation options and                         risks were discussed with the patient. All questions                         were answered and informed consent was obtained.                         Patient identification and proposed procedure were                         verified by the physician, the nurse, the                         anesthesiologist, the anesthetist and the technician                         in the pre-procedure area in the procedure room in the                         endoscopy suite. Mental Status Examination: alert and                         oriented. Airway Examination: normal oropharyngeal                         airway and neck mobility. Respiratory Examination:                         clear to auscultation. CV Examination: normal.                         Prophylactic  Antibiotics: The patient does not require                         prophylactic antibiotics. Prior Anticoagulants: The                         patient has taken no previous anticoagulant or    antiplatelet agents. ASA Grade Assessment: III - A                         patient with severe systemic disease. After reviewing                         the risks and benefits, the patient was deemed in                         satisfactory condition to undergo the procedure. The                         anesthesia plan was to use monitored anesthesia care                         (MAC). Immediately prior to administration of                         medications, the patient was re-assessed for adequacy                         to receive sedatives. The heart rate, respiratory                         rate, oxygen saturations, blood pressure, adequacy of                         pulmonary ventilation, and response to care were                         monitored throughout the procedure. The physical                         status of the patient was re-assessed after the                         procedure.                        After obtaining informed consent, the endoscope was                         passed under direct vision. Throughout the procedure,                         the patient's blood pressure, pulse, and oxygen                         saturations were monitored continuously. The Endoscope                         was introduced through the mouth, and advanced to the  second part of duodenum. The upper GI endoscopy was                         accomplished without difficulty. The patient tolerated                         the procedure well. Findings:      Multiple angioectasias without bleeding were found in the second portion       of the duodenum. Coagulation for bleeding prevention using argon plasma       was successful. Estimated blood loss: none.      The duodenal bulb was normal.      The entire examined stomach was normal.      The gastroesophageal junction and examined esophagus were normal.      Lymphangiectasia was present in the second portion of the  duodenum. Impression:            - Multiple non-bleeding angioectasias in the duodenum.                         Treated with argon plasma coagulation (APC).                        - Normal duodenal bulb.                        - Normal stomach.                        - Normal gastroesophageal junction and esophagus.                        - No specimens collected. Recommendation:        - Return patient to hospital ward for ongoing care.                        - To visualize the small bowel, perform video capsule                         endoscopy today.                        - NPO.                        - Continue present medications. Procedure Code(s):     --- Professional ---                        [id number], Esophagogastroduodenoscopy, flexible,                         transoral; with control of bleeding, any method Diagnosis Code(s):     --- Professional ---                        F29.244, Angiodysplasia of stomach and duodenum                         without bleeding                        D50.0, Iron  deficiency anemia secondary to blood loss                         (chronic) CPT copyright 2019 American Medical Association. All rights reserved. The codes documented in this report are preliminary and upon coder review may  be revised to meet current compliance requirements. Dr. Ulyess Mort Lin Landsman MD, MD 07/18/2019 12:15:59 PM This report has been signed electronically. Number of Addenda: 0 Note Initiated On: 07/18/2019 10:36 AM Estimated Blood Loss:  Estimated blood loss: none. Estimated blood loss: none.      [facility]

## 2019-07-18 NOTE — Op Note (Signed)
[facility] Gastroenterology Patient Name: Alex Larson Procedure Date: 07/18/2019 10:35 AM MRN: [id number] Account #: [id number] Date of Birth: 10/02/1926 Admit Type: Inpatient Age: 86 Room: [facility] ENDO ROOM 4 Gender: Male Note Status: Finalized Procedure:             Colonoscopy Indications:           Iron deficiency anemia secondary to chronic blood loss Providers:             Lin Landsman MD, MD Referring MD:          Lin Landsman MD, MD (Referring MD) Medicines:             Monitored Anesthesia Care Complications:         No immediate complications. Estimated blood loss: None. Procedure:             Pre-Anesthesia Assessment:                        - Prior to the procedure, a History and Physical was                         performed, and patient medications and allergies were                         reviewed. The patient is competent. The risks and                         benefits of the procedure and the sedation options and                         risks were discussed with the patient. All questions                         were answered and informed consent was obtained.                         Patient identification and proposed procedure were                         verified by the physician, the nurse, the                         anesthesiologist, the anesthetist and the technician                         in the pre-procedure area in the procedure room in the                         endoscopy suite. Mental Status Examination: alert and                         oriented. Airway Examination: normal oropharyngeal                         airway and neck mobility. Respiratory Examination:                         clear to auscultation. CV Examination: normal.  Prophylactic Antibiotics: The patient does not require                         prophylactic antibiotics. Prior Anticoagulants: The                         patient has  taken no previous anticoagulant or                         antiplatelet agents. ASA Grade Assessment: III - A                         patient with severe systemic disease. After reviewing                         the risks and benefits, the patient was deemed in                         satisfactory condition to undergo the procedure. The                         anesthesia plan was to use monitored anesthesia care                         (MAC). Immediately prior to administration of                         medications, the patient was re-assessed for adequacy                         to receive sedatives. The heart rate, respiratory                         rate, oxygen saturations, blood pressure, adequacy of                         pulmonary ventilation, and response to care were                         monitored throughout the procedure. The physical                         status of the patient was re-assessed after the                         procedure.                        After obtaining informed consent, the colonoscope was                         passed under direct vision. Throughout the procedure,                         the patient's blood pressure, pulse, and oxygen                         saturations were monitored continuously. The  Colonoscope was introduced through the anus and                         advanced to the 20 cm into the ileum. The colonoscopy                         was performed without difficulty. The patient                         tolerated the procedure well. The quality of the bowel                         preparation was good. Findings:      The perianal and digital rectal examinations were normal. Pertinent       negatives include normal sphincter tone and no palpable rectal lesions.      The terminal ileum appeared normal till 30cm.      Multiple diverticula were found in the sigmoid colon and descending       colon. There was no  evidence of diverticular bleeding.      Non-bleeding external and internal hemorrhoids were found during       retroflexion. The hemorrhoids were large. Impression:            - The examined portion of the ileum was normal.                        - Severe diverticulosis in the sigmoid colon and in                         the descending colon. There was no evidence of                         diverticular bleeding.                        - Non-bleeding external and internal hemorrhoids.                        - No specimens collected. Recommendation:        - Return patient to hospital ward for ongoing care.                        - To visualize the small bowel, perform video capsule                         endoscopy today.                        - NPO until capsule is placed.                        - No aspirin, ibuprofen, naproxen, or other                         non-steroidal anti-inflammatory drugs.                        - Recommend iron replacement                        -  Return to GI clinic in 1 month. Procedure Code(s):     --- Professional ---                        [phone], Colonoscopy, flexible; diagnostic, including                         collection of specimen(s) by brushing or washing, when                         performed (separate procedure) Diagnosis Code(s):     --- Professional ---                        K64.8, Other hemorrhoids                        D50.0, Iron deficiency anemia secondary to blood loss                         (chronic)                        K57.30, Diverticulosis of large intestine without                         perforation or abscess without bleeding CPT copyright 2019 American Medical Association. All rights reserved. The codes documented in this report are preliminary and upon coder review may  be revised to meet current compliance requirements. Dr. Ulyess Mort Lin Landsman MD, MD 07/18/2019 12:35:18 PM This report has been signed  electronically. Number of Addenda: 0 Note Initiated On: 07/18/2019 10:35 AM Scope Withdrawal Time: 0 hours 10 minutes 33 seconds  Total Procedure Duration: 0 hours 13 minutes 15 seconds  Estimated Blood Loss:  Estimated blood loss: none.      [facility]

## 2019-07-18 NOTE — Progress Notes (Signed)
15 minute call to floor. 

## 2019-07-19 DIAGNOSIS — K31819 Angiodysplasia of stomach and duodenum without bleeding: Secondary | ICD-10-CM

## 2019-07-19 DIAGNOSIS — I509 Heart failure, unspecified: Secondary | ICD-10-CM

## 2019-07-19 DIAGNOSIS — E43 Unspecified severe protein-calorie malnutrition: Secondary | ICD-10-CM

## 2019-07-19 LAB — TYPE AND SCREEN
ABO/RH(D): B NEG
Antibody Screen: NEGATIVE
Unit division: 0
Unit division: 0

## 2019-07-19 LAB — BASIC METABOLIC PANEL
Anion gap: 9 (ref 5–15)
BUN: 74 mg/dL — ABNORMAL HIGH (ref 8–23)
CO2: 22 mmol/L (ref 22–32)
Calcium: 9.9 mg/dL (ref 8.9–10.3)
Chloride: 113 mmol/L — ABNORMAL HIGH (ref 98–111)
Creatinine, Ser: 4.67 mg/dL — ABNORMAL HIGH (ref 0.61–1.24)
GFR calc Af Amer: 12 mL/min — ABNORMAL LOW (ref >60)
GFR calc non Af Amer: 10 mL/min — ABNORMAL LOW (ref >60)
Glucose, Bld: 68 mg/dL — ABNORMAL LOW (ref 70–99)
Potassium: 3.8 mmol/L (ref 3.5–5.1)
Sodium: 144 mmol/L (ref 135–145)

## 2019-07-19 LAB — BPAM RBC
Blood Product Expiration Date: 202103032359
Blood Product Expiration Date: 202103042359
ISSUE DATE / TIME: 202102120405
ISSUE DATE / TIME: 202102131502
Unit Type and Rh: 1700
Unit Type and Rh: 9500

## 2019-07-19 LAB — VITAMIN B12: Vitamin B-12: 1442 pg/mL — ABNORMAL HIGH (ref 180–914)

## 2019-07-19 LAB — T4, FREE: Free T4: <0.25 ng/dL — ABNORMAL LOW (ref 0.61–1.12)

## 2019-07-19 LAB — HEMOGLOBIN: Hemoglobin: 8.6 g/dL — ABNORMAL LOW (ref 13.0–17.0)

## 2019-07-19 MED ORDER — PANTOPRAZOLE SODIUM 40 MG PO TBEC
40.0000 mg | DELAYED_RELEASE_TABLET | Freq: Every day | ORAL | Status: DC
  Administered 2019-07-19 – 2019-07-20 (×2): 40 mg via ORAL
  Filled 2019-07-19 (×2): qty 1

## 2019-07-19 MED ORDER — PANTOPRAZOLE SODIUM 40 MG IV SOLR
40.0000 mg | Freq: Two times a day (BID) | INTRAVENOUS | Status: DC
  Administered 2019-07-19: 40 mg via INTRAVENOUS
  Filled 2019-07-19: qty 40

## 2019-07-19 MED ORDER — FUROSEMIDE 40 MG PO TABS
40.0000 mg | ORAL_TABLET | Freq: Every day | ORAL | Status: DC
  Administered 2019-07-19: 14:00:00 40 mg via ORAL
  Filled 2019-07-19: qty 1

## 2019-07-19 MED ORDER — NEPRO/CARBSTEADY PO LIQD
237.0000 mL | Freq: Two times a day (BID) | ORAL | Status: DC
  Administered 2019-07-19 – 2019-07-20 (×2): 237 mL via ORAL

## 2019-07-19 NOTE — Progress Notes (Signed)
Alex Darby, MD [address]  [place]  [place], [place] [address]  Main: [phone]  Fax: [phone] Pager: [phone]   Subjective: No acute events overnight, patient reports that his stools are lighter compared to admission Hemoglobin is improving.  Patient underwent upper endoscopy yesterday with cauterization of several duodenal AVMs   Objective: Vital signs in last 24 hours: Vitals:   07/19/19 1021 07/19/19 1025 07/19/19 1123 07/19/19 1130  BP:   121/67   Pulse:   60   Resp:      Temp:   (!) 97.5 F (36.4 C)   TempSrc:   Oral   SpO2: 90% 96% (!) 88% 92%  Weight:      Height:       Weight change:   Intake/Output Summary (Last 24 hours) at 07/19/2019 1800 Last data filed at 07/19/2019 1600 Gross per 24 hour  Intake 883.68 ml  Output 0 ml  Net 883.68 ml     Exam: Heart:: Regular rate and rhythm, S1S2 present or without murmur or extra heart sounds Lungs: normal and clear to auscultation Abdomen: soft, nontender, normal bowel sounds   Lab Results: CBC Latest Ref Rng & Units 07/19/2019 07/18/2019 07/17/2019  WBC 4.0 - 10.5 K/uL - - -  Hemoglobin 13.0 - 17.0 g/dL 8.6(L) 7.6(L) 7.4(L)  Hematocrit 39.0 - 52.0 % - 23.7(L) 22.9(L)  Platelets 150 - 400 K/uL - - -   CMP Latest Ref Rng & Units 07/19/2019 07/17/2019 07/16/2019  Glucose 70 - 99 mg/dL 68(L) 125(H) 134(H)  BUN 8 - 23 mg/dL 74(H) 79(H) 87(H)  Creatinine 0.61 - 1.24 mg/dL 4.67(H) 4.72(H) 4.68(H)  Sodium 135 - 145 mmol/L 144 139 140  Potassium 3.5 - 5.1 mmol/L 3.8 4.5 4.7  Chloride 98 - 111 mmol/L 113(H) 108 108  CO2 22 - 32 mmol/L 22 24 18(L)  Calcium 8.9 - 10.3 mg/dL 9.9 10.2 10.3  Total Protein 6.5 - 8.1 g/dL - - 7.4  Total Bilirubin 0.3 - 1.2 mg/dL - - 0.9  Alkaline Phos 38 - 126 U/L - - 67  AST 15 - 41 U/L - - 45(H)  ALT 0 - 44 U/L - - 22    Micro Results: Recent Results (from the past 240 hour(s))  Respiratory Panel by RT PCR (Flu A&B, Covid) - Nasopharyngeal Swab     Status:  None   Collection Time: 07/16/19 11:44 PM   Specimen: Nasopharyngeal Swab  Result Value Ref Range Status   SARS Coronavirus 2 by RT PCR NEGATIVE NEGATIVE Final    Comment: (NOTE) SARS-CoV-2 target nucleic acids are NOT DETECTED. The SARS-CoV-2 RNA is generally detectable in upper respiratoy specimens during the acute phase of infection. The lowest concentration of SARS-CoV-2 viral copies this assay can detect is 131 copies/mL. A negative result does not preclude SARS-Cov-2 infection and should not be used as the sole basis for treatment or other patient management decisions. A negative result may occur with  improper specimen collection/handling, submission of specimen other than nasopharyngeal swab, presence of viral mutation(s) within the areas targeted by this assay, and inadequate number of viral copies (<131 copies/mL). A negative result must be combined with clinical observations, patient history, and epidemiological information. The expected result is Negative. Fact Sheet for Patients:  PinkCheek.be Fact Sheet for Healthcare Providers:  GravelBags.it This test is not yet ap proved or cleared by the [place] FDA and  has been authorized for detection and/or diagnosis of SARS-CoV-2 by FDA under an  Emergency Use Authorization (EUA). This EUA will remain  in effect (meaning this test can be used) for the duration of the COVID-19 declaration under Section 564(b)(1) of the Act, 21 U.S.C. section 360bbb-3(b)(1), unless the authorization is terminated or revoked sooner.    Influenza A by PCR NEGATIVE NEGATIVE Final   Influenza B by PCR NEGATIVE NEGATIVE Final    Comment: (NOTE) The Xpert Xpress SARS-CoV-2/FLU/RSV assay is intended as an aid in  the diagnosis of influenza from Nasopharyngeal swab specimens and  should not be used as a sole basis for treatment. Nasal washings and  aspirates are unacceptable for Xpert  Xpress SARS-CoV-2/FLU/RSV  testing. Fact Sheet for Patients: PinkCheek.be Fact Sheet for Healthcare Providers: GravelBags.it This test is not yet approved or cleared by the [place] FDA and  has been authorized for detection and/or diagnosis of SARS-CoV-2 by  FDA under an Emergency Use Authorization (EUA). This EUA will remain  in effect (meaning this test can be used) for the duration of the  Covid-19 declaration under Section 564(b)(1) of the Act, 21  U.S.C. section 360bbb-3(b)(1), unless the authorization is  terminated or revoked. Performed at [facility], [address] [place], [place] [address]    Studies/Results: No results found. Medications:  Prior to Admission:  Medications Prior to Admission  Medication Sig Dispense Refill Last Dose  . amLODipine (NORVASC) 5 MG tablet Take 1 tablet (5 mg total) by mouth daily. 90 tablet 3 07/16/2019 at 1000  . aspirin EC 81 MG tablet Take 1 tablet (81 mg total) by mouth daily. 90 tablet 3 07/16/2019 at 1000  . atorvastatin (LIPITOR) 40 MG tablet Take 1 tablet (40 mg total) by mouth daily. 90 tablet 3 07/16/2019 at 1000  . carvedilol (COREG) 3.125 MG tablet Take 1 tablet (3.125 mg total) by mouth 2 (two) times daily with a meal. 60 tablet 5 07/16/2019 at 1000  . ferrous sulfate 220 (44 Fe) MG/5ML solution Take 220 mg by mouth daily.   07/16/2019 at 1000  . furosemide (LASIX) 40 MG tablet Take 1 tablet (40 mg total) by mouth 2 (two) times daily. (Patient taking differently: Take 80 mg by mouth 2 (two) times daily. ) 60 tablet 5 07/16/2019 at 1000  . isosorbide mononitrate (ISMO) 10 MG tablet Take 1 tablet by mouth once daily 90 tablet 3 07/16/2019 at 1000  . multivitamin-iron-minerals-folic acid (CENTRUM) chewable tablet Chew 1 tablet by mouth daily.   07/16/2019 at 1000  . pantoprazole (PROTONIX) 40 MG tablet Take 1 tablet (40 mg total) by mouth daily. 90 tablet 1 07/16/2019 at  1000  . sacubitril-valsartan (ENTRESTO) 49-51 MG Take 1 tablet by mouth 2 (two) times daily. 60 tablet 5 07/16/2019 at 1000  . indapamide (LOZOL) 2.5 MG tablet Take 1 tablet (2.5 mg total) by mouth daily. 30 minutes prior to furosemide (Patient not taking: Reported on 07/16/2019) 30 tablet 5 Not Taking at Unknown time   Scheduled: . amLODipine  5 mg Oral Daily  . atorvastatin  40 mg Oral Daily  . carvedilol  3.125 mg Oral BID WC  . feeding supplement (NEPRO CARB STEADY)  237 mL Oral BID BM  . ferrous sulfate  220 mg Oral Daily  . furosemide  40 mg Oral Daily  . isosorbide mononitrate  10 mg Oral Daily  . levothyroxine  50 mcg Oral Q0600  . multivitamin with minerals  1 tablet Oral Daily  . pantoprazole  40 mg Oral Daily   Continuous:  [id number] **OR**  acetaminophen, ondansetron **OR** ondansetron (ZOFRAN) IV, traZODone Anti-infectives (From admission, onward)   None     Scheduled Meds: . amLODipine  5 mg Oral Daily  . atorvastatin  40 mg Oral Daily  . carvedilol  3.125 mg Oral BID WC  . feeding supplement (NEPRO CARB STEADY)  237 mL Oral BID BM  . ferrous sulfate  220 mg Oral Daily  . furosemide  40 mg Oral Daily  . isosorbide mononitrate  10 mg Oral Daily  . levothyroxine  50 mcg Oral Q0600  . multivitamin with minerals  1 tablet Oral Daily  . pantoprazole  40 mg Oral Daily   Continuous Infusions: PRN Meds:.acetaminophen **OR** acetaminophen, ondansetron **OR** ondansetron (ZOFRAN) IV, traZODone   Assessment: Active Problems:   GI bleeding   Symptomatic anemia   Melena   Acute renal failure superimposed on chronic kidney disease ([name])   Protein-calorie malnutrition, severe   Chronic heart failure ([place])  S/p EGD 07/19/2019 which revealed several nonbleeding duodenal AVMs, s/p APC Hemoglobin is improving, no evidence of active GI bleed at this time  VCE result Capsule retained in stomach for 4 hrs which is above normal as pt is not ambulatory Image of the  cecum not captured as length of recorder time is 8hrs only Scattered lymphangiectasias are identified. A tiny non bleeding red spot is identified, likely of no clinical significance No active bleeding present  Plan:  No further work up from GI stand point Stop asa 81mg  daily if not medically necessary Oral iron with MVI daily Protonix 40mg  daily  Optimize thyroid hormone replacement therapy which will help with correction of anemia as well Close follow up with GI in 3-4 weeks Recheck CBC in 2 weeks, needs follow up with PCP  GI will sign off at this time, please call us back with questions/concerns   LOS: 2 days   Rex Oesterle 07/19/2019, 6:00 PM

## 2019-07-19 NOTE — Progress Notes (Signed)
[place], [place] 07/19/19  Subjective:   Hospital day # 2  Patient resting comfortably in bed. Still has diminished renal function. Diminished appetite noted today as well.  Objective:  Vital signs in last 24 hours:  Temp:  [96.8 F (36 C)-98.7 F (37.1 C)] 97.5 F (36.4 C) (02/14 1123) Pulse Rate:  [54-60] 60 (02/14 1123) Resp:  [16-24] 24 (02/14 1018) BP: (114-130)/(53-67) 121/67 (02/14 1123) SpO2:  [88 %-96 %] 92 % (02/14 1130)  Weight change:  Filed Weights   07/16/19 2249 07/17/19 0314  Weight: 78 kg 75.5 kg    Intake/Output:    Intake/Output Summary (Last 24 hours) at 07/19/2019 1618 Last data filed at 07/19/2019 1600 Gross per 24 hour  Intake 883.68 ml  Output 0 ml  Net 883.68 ml     Physical Exam: General:  Frail, elderly gentleman, laying in the bed  HEENT  anicteric, moist oral mucosa  Pulm/lungs  normal breathing effort, clear to auscultation  CVS/Heart  no rub  Abdomen:   Soft nontender  Extremities:  No peripheral edema  Neurologic:  Alert, able to answer question  Skin:  No acute rashes    Basic Metabolic Panel:  Recent Labs  Lab 07/16/19 2307 07/17/19 0029 07/17/19 0219 07/19/19 0515  NA 140  --  139 144  K 4.7  --  4.5 3.8  CL 108  --  108 113*  CO2 18*  --  24 22  GLUCOSE 134*  --  125* 68*  BUN 87*  --  79* 74*  CREATININE 4.68*  --  4.72* 4.67*  CALCIUM 10.3  --  10.2 9.9  MG  --  2.1  --   --      CBC: Recent Labs  Lab 07/16/19 2307 07/16/19 2307 07/17/19 0912 07/17/19 1323 07/17/19 1926 07/18/19 0221 07/19/19 0515  WBC 4.8  --  4.4  --   --   --   --   HGB 6.4*   < > 7.4* 7.2* 7.4* 7.6* 8.6*  HCT 21.0*  --  22.6* 22.4* 22.9* 23.7*  --   MCV 101.4*  --  96.2  --   --   --   --   PLT 128*  --  189  --   --   --   --    < > = values in this interval not displayed.      Lab Results  Component Value Date   HEPBSAG Negative 10/28/2018   HEPBSAB Non Reactive 10/28/2018   HEPBIGM  Negative 10/28/2018      Microbiology:  Recent Results (from the past 240 hour(s))  Respiratory Panel by RT PCR (Flu A&B, Covid) - Nasopharyngeal Swab     Status: None   Collection Time: 07/16/19 11:44 PM   Specimen: Nasopharyngeal Swab  Result Value Ref Range Status   SARS Coronavirus 2 by RT PCR NEGATIVE NEGATIVE Final    Comment: (NOTE) SARS-CoV-2 target nucleic acids are NOT DETECTED. The SARS-CoV-2 RNA is generally detectable in upper respiratoy specimens during the acute phase of infection. The lowest concentration of SARS-CoV-2 viral copies this assay can detect is 131 copies/mL. A negative result does not preclude SARS-Cov-2 infection and should not be used as the sole basis for treatment or other patient management decisions. A negative result may occur with  improper specimen collection/handling, submission of specimen other than nasopharyngeal swab, presence of viral mutation(s) within the areas targeted by this assay, and inadequate number of viral  copies (<131 copies/mL). A negative result must be combined with clinical observations, patient history, and epidemiological information. The expected result is Negative. Fact Sheet for Patients:  PinkCheek.be Fact Sheet for Healthcare Providers:  GravelBags.it This test is not yet ap proved or cleared by the [place] FDA and  has been authorized for detection and/or diagnosis of SARS-CoV-2 by FDA under an Emergency Use Authorization (EUA). This EUA will remain  in effect (meaning this test can be used) for the duration of the COVID-19 declaration under Section 564(b)(1) of the Act, 21 U.S.C. section 360bbb-3(b)(1), unless the authorization is terminated or revoked sooner.    Influenza A by PCR NEGATIVE NEGATIVE Final   Influenza B by PCR NEGATIVE NEGATIVE Final    Comment: (NOTE) The Xpert Xpress SARS-CoV-2/FLU/RSV assay is intended as an aid in  the  diagnosis of influenza from Nasopharyngeal swab specimens and  should not be used as a sole basis for treatment. Nasal washings and  aspirates are unacceptable for Xpert Xpress SARS-CoV-2/FLU/RSV  testing. Fact Sheet for Patients: PinkCheek.be Fact Sheet for Healthcare Providers: GravelBags.it This test is not yet approved or cleared by the [place] FDA and  has been authorized for detection and/or diagnosis of SARS-CoV-2 by  FDA under an Emergency Use Authorization (EUA). This EUA will remain  in effect (meaning this test can be used) for the duration of the  Covid-19 declaration under Section 564(b)(1) of the Act, 21  U.S.C. section 360bbb-3(b)(1), unless the authorization is  terminated or revoked. Performed at [facility], [address] [place], [place] [address]     Coagulation Studies: Recent Labs    07/17/19 0029  LABPROT 17.6*  INR 1.5*    Urinalysis: No results for input(s): COLORURINE, LABSPEC, PHURINE, GLUCOSEU, HGBUR, BILIRUBINUR, KETONESUR, PROTEINUR, UROBILINOGEN, NITRITE, LEUKOCYTESUR in the last 72 hours.  Invalid input(s): APPERANCEUR    Imaging: No results found.   Medications:    . amLODipine  5 mg Oral Daily  . atorvastatin  40 mg Oral Daily  . carvedilol  3.125 mg Oral BID WC  . feeding supplement (NEPRO CARB STEADY)  237 mL Oral BID BM  . ferrous sulfate  220 mg Oral Daily  . furosemide  40 mg Oral Daily  . isosorbide mononitrate  10 mg Oral Daily  . levothyroxine  50 mcg Oral Q0600  . multivitamin with minerals  1 tablet Oral Daily  . pantoprazole  40 mg Intravenous Q12H   acetaminophen **OR** acetaminophen, ondansetron **OR** ondansetron (ZOFRAN) IV, traZODone  Assessment/ Plan:  86 y.o. male with coronary disease, vascular disease, nausea, chronic kidney disease, congestive heart failure with EF 10 to 15%   admitted on 07/16/2019 for Melena [K92.1] GI bleeding  [K92.2] Elevated troponin level [R77.8] Symptomatic anemia [D64.9] Chronic heart failure, unspecified heart failure type ([place]) [I50.9] Acute renal failure superimposed on chronic kidney disease, unspecified CKD stage, unspecified acute renal failure type ([place]) [N17.9, N18.9]  2D echo May 2020: LVEF with severely reduced systolic function, LVEF 10 to 15%, severe pulmonary hypertension, right ventricle systolic pressure 70 mm  # chronic kidney disease stage V Patient continues to have significantly diminished renal function.  BUN 74 with a creatinine of 4.6 and EGFR of 12.  We have had some discussions with the patient regarding renal replacement therapy.  Patient is still contemplating this.  Suspect that he may develop issues with volume given his underlying heart failure continue to monitor clinically for now.  #Anemia of chronic kidney disease Hemoglobin  up to 8.6 posttransfusion.  Appreciate gastroenterology work-up.  #Hypertension. Maintain the patient on amlodipine and carvedilol.    LOS: 2 Mikyah Alamo 2/14/20214:18 PM  [place], [place]  Note: This note was prepared with Dragon dictation. Any transcription errors are unintentional

## 2019-07-19 NOTE — Progress Notes (Signed)
[place] [facility] [place] NAME: Alex Larson    MR#:  [id number]  DATE OF BIRTH:  [birth year]  SUBJECTIVE:   Patient was referred by his nephrologist with hemoglobin of 6.0 Patient denies any active bleeding. Hss noticed some black alert stools but he is on iron pills. Patient told me he has a lot of issues with anemia in the past and has had G.I. workup also  Denies any abdominal pain denies taking excessive use of NSAIDs or any other blood thinners  No complaints today Worked with PT REVIEW OF SYSTEMS:   Review of Systems  Constitutional: Negative for chills, fever and weight loss.  HENT: Negative for ear discharge, ear pain and nosebleeds.   Eyes: Negative for blurred vision, pain and discharge.  Respiratory: Negative for sputum production, shortness of breath, wheezing and stridor.   Cardiovascular: Negative for chest pain, palpitations, orthopnea and PND.  Gastrointestinal: Positive for melena. Negative for abdominal pain, diarrhea, nausea and vomiting.  Genitourinary: Negative for frequency and urgency.  Musculoskeletal: Negative for back pain and joint pain.  Neurological: Positive for weakness. Negative for sensory change, speech change and focal weakness.  Psychiatric/Behavioral: Negative for depression and hallucinations. The patient is not nervous/anxious.    Tolerating Diet:2 g salt diet Tolerating PT: pending  DRUG ALLERGIES:  No Known Allergies  VITALS:  Blood pressure 121/67, pulse 60, temperature (!) 97.5 F (36.4 C), temperature source Oral, resp. rate (!) 24, height 6\' 2"  (1.88 m), weight 75.5 kg, SpO2 (!) 88 %.  PHYSICAL EXAMINATION:   Physical Exam  GENERAL:  86 y.o.-year-old patient lying in the bed with no acute distress. Pallor+, Thin EYES: Pupils equal, round, reactive to light and accommodation. No scleral icterus.   HEENT: Head atraumatic, normocephalic. Oropharynx and nasopharynx clear.  NECK:  Supple, no  jugular venous distention. No thyroid enlargement, no tenderness.  LUNGS: Normal breath sounds bilaterally, no wheezing, rales, rhonchi. No use of accessory muscles of respiration.  CARDIOVASCULAR: S1, S2 normal. No murmurs, rubs, or gallops.  ABDOMEN: Soft, nontender, nondistended. Bowel sounds present. No organomegaly or mass.  EXTREMITIES: No cyanosis, clubbing or edema b/l.    NEUROLOGIC: Cranial nerves II through XII are intact. No focal Motor or sensory deficits b/l.   PSYCHIATRIC:  patient is alert and oriented x 3.  SKIN: No obvious rash, lesion, or ulcer.   LABORATORY PANEL:  CBC Recent Labs  Lab 07/17/19 0912 07/17/19 1323 07/18/19 0221 07/18/19 0221 07/19/19 0515  WBC 4.4  --   --   --   --   HGB 7.4*   < > 7.6*   < > 8.6*  HCT 22.6*   < > 23.7*  --   --   PLT 189  --   --   --   --    < > = values in this interval not displayed.    Chemistries  Recent Labs  Lab 07/16/19 2307 07/17/19 0029 07/17/19 0219 07/19/19 0515  NA 140  --    < > 144  K 4.7  --    < > 3.8  CL 108  --    < > 113*  CO2 18*  --    < > 22  GLUCOSE 134*  --    < > 68*  BUN 87*  --    < > 74*  CREATININE 4.68*  --    < > 4.67*  CALCIUM 10.3  --    < >  9.9  MG  --  2.1  --   --   AST 45*  --   --   --   ALT 22  --   --   --   ALKPHOS 67  --   --   --   BILITOT 0.9  --   --   --    < > = values in this interval not displayed.   Cardiac Enzymes No results for input(s): TROPONINI in the last 168 hours. RADIOLOGY:  No results found. ASSESSMENT AND PLAN:  Esther Sellars  is a 86 y.o. African-American male with a known history of coronary artery disease, stage V chronic kidney disease, heart failure with reduced ejection fraction, hypertension and ischemic cardiomyopathy who presented to the emergency room with acute onset of worsening dyspnea since yesterday p.m. with no cough or wheezing. He saw Dr. Juleen China in clinic 2 days ago and lab results came back tonight which indicated a  hemoglobin of 6 which is acutely decreased from prior  1.  GI bleeding with subsequent acute blood loss anemia on top of chronic anemia. -no active bleeding per pt -has had issues with melonotic stools however is on iron supplements also. -found to have hemoglobin of 6.0--- two unit blood transfusion-- 7.4-- 7.2--7.6--2nd unit today (has severe cardiomyopathy and would like Hgb >8.0)-- 8.6 -hgb 11.2 in may 2020 - on IV Protonix drip. -hold asa --Seen by G.I. Dr Marius Ditch - EGD- Multiple non-bleeding angioectasias in the duodenum.                         Treated with argon plasma coagulation (APC).                        - Normal duodenal bulb.                        - Normal stomach.                        - Normal gastroesophageal  -colonoscopy Severe diverticulosis in the sigmoid colon and in                         the descending colon. There was no evidence of                         diverticular bleeding.                        - Non-bleeding external and internal hemorrhoids. -Capsule endoscopy results pending -received Epogen on 07/17/19  2.  Stage V chronic kidney disease. -seen by Nephrology--to d/w pt and dter given severe CHF (EF 10%) whether they would like HD or cont med mnx  3.  Hypertension. -Coreg,Norvasc --holding Entresto with creat so high  4. Acute on chronicSevere Cardiomyopathy/ Chronic Ischemic cardiomyopathy EF 10-15% by echo may 2020 - follows with [facility] cardiology -LAsix 20 mg IV x1 today with elevated BNP 3400 and severe anemia -on chronic home oxygen -cont coreg and norvasc -enteresto on hold (high creat) -?spironolactone - on chronic home oxygen  5.  Dyslipidemia. -Statin therapy will be resumed.  6.  DVT prophylaxis. -Medical DVT prophylaxis is currently contraindicated due to GI bleeding. -cont SCDs.  7. Nutrition Status: Nutrition Problem: Severe Malnutrition Etiology: chronic illness(CKD stage  V, CHF) Signs/Symptoms: severe fat depletion,  severe muscle depletion Interventions: Refer to RD note for recommendations   Procedures: Family communication :dter [name] on the phone Consults : G.I. Discharge Disposition : home with HHPT likely on monday CODE STATUS: FULL Code-- d/w HCPOA dter DVT Prophylaxis : SCD Barriers to discharge: G.I. evaluation  TOTAL TIME TAKING CARE OF THIS PATIENT: *30* minutes.  >50% time spent on counselling and coordination of care  Note: This dictation was prepared with Dragon dictation along with smaller phrase technology. Any transcriptional errors that result from this process are unintentional.  Fritzi Mandes M.D    [facility]   CC: Primary care physician; Leone Haven, MDPatient ID: [name], male   DOB: [birth year], 86 y.o.   MRN: [id number]

## 2019-07-19 NOTE — Progress Notes (Signed)
Daughter of the patient requesting a supplement for the patient since he doesn't have a good appetite. Order received from Dr Posey Pronto for Nepro carb

## 2019-07-19 NOTE — Evaluation (Signed)
Physical Therapy Evaluation Patient Details Name: Alex Larson MRN: [id number] DOB: 09/27/1926 Today's Date: 07/19/2019   History of Present Illness  86 yo male with onset of SOB and drop in temp was admitted for evaluation.  Covid (-).  Pt has been transfused, noted cardiomegaly and pulm edema.  PMHx:  Interstitial lung disease, CKD 5, CHF, HTN, ischemic cardiomyopathy, CAD, PAD, RLE bypass graft, B TKA's, CHF  Clinical Impression  Pt was seen for work on mobility with pt demonstrating a significant difficulty maintaining sats even on 6L O2.  He is on O2 at home all the time, and had the following values:  Supine on 5L 95%, sitting on 5L 88%, sitting on 6L 91%, walking with 6L short trip 91% and 50' gait on 6L 84%.   Recommending home with family with HHPT and continue acutely for strength, monitoring of sats and recovery of gait tolerance.    Follow Up Recommendations Home health PT;Supervision for mobility/OOB    Equipment Recommendations  None recommended by PT    Recommendations for Other Services       Precautions / Restrictions Precautions Precautions: Fall Precaution Comments: close monitoring of sats Restrictions Weight Bearing Restrictions: No      Mobility  Bed Mobility Overal bed mobility: Needs Assistance Bed Mobility: Sidelying to Sit;Sit to Sidelying   Sidelying to sit: Min assist     Sit to sidelying: Min assist General bed mobility comments: pt is motivated and accustomed to being able to move  Transfers Overall transfer level: Needs assistance Equipment used: Rolling walker (2 wheeled);1 person hand held assist Transfers: Sit to/from Stand Sit to Stand: Min assist;From elevated surface         General transfer comment: min with cues for hand placement every time  Ambulation/Gait Ambulation/Gait assistance: Min guard(with assist for O2 tank) Gait Distance (Feet): 50 Feet Assistive device: Rolling walker (2 wheeled);1 person hand held  assist Gait Pattern/deviations: Step-through pattern;Wide base of support;Decreased stride length Gait velocity: reduced Gait velocity interpretation: <1.31 ft/sec, indicative of household ambulator General Gait Details: gait in confined area due to drops in sats on 5L just to sit, but with longer gait dropped to 84%  Stairs            Wheelchair Mobility    Modified Rankin (Stroke Patients Only)       Balance Overall balance assessment: Needs assistance Sitting-balance support: Feet supported Sitting balance-Leahy Scale: Fair     Standing balance support: Bilateral upper extremity supported;During functional activity Standing balance-Leahy Scale: Poor Standing balance comment: RW used but SPC was main prev device                             Pertinent Vitals/Pain Pain Assessment: No/denies pain    Home Living Family/patient expects to be discharged to:: Private residence Living Arrangements: Children Available Help at Discharge: Family;Available 24 hours/day Type of Home: House Home Access: Level entry     Home Layout: Two level;Able to live on main level with bedroom/bathroom Home Equipment: Gilford Rile - 2 wheels;Cane - single point;Shower seat;Wheelchair - manual Additional Comments: pt reports his wife used wc and he is 6'2"    Prior Function Level of Independence: Independent with assistive device(s)         Comments: on O2 24/7 at home     Hand Dominance   Dominant Hand: Right    Extremity/Trunk Assessment   Upper Extremity Assessment Upper Extremity Assessment: Overall  WFL for tasks assessed    Lower Extremity Assessment Lower Extremity Assessment: Overall WFL for tasks assessed;Generalized weakness    Cervical / Trunk Assessment Cervical / Trunk Assessment: Normal  Communication   Communication: No difficulties  Cognition Arousal/Alertness: Awake/alert Behavior During Therapy: WFL for tasks assessed/performed Overall Cognitive  Status: Within Functional Limits for tasks assessed                                        General Comments General comments (skin integrity, edema, etc.): pt was seen for eval and note sats cannot be maintained at 5L O2 but with longer walk of 50' was unable to maintain sat and dropped to 84% temporarily    Exercises     Assessment/Plan    PT Assessment Patient needs continued PT services  PT Problem List Decreased strength;Decreased range of motion;Decreased activity tolerance;Decreased balance;Decreased mobility;Decreased coordination;Decreased knowledge of use of DME;Cardiopulmonary status limiting activity       PT Treatment Interventions DME instruction;Gait training;Functional mobility training;Therapeutic activities;Therapeutic exercise;Balance training;Neuromuscular re-education;Patient/family education    PT Goals (Current goals can be found in the Care Plan section)  Acute Rehab PT Goals Patient Stated Goal: to get better and get home PT Goal Formulation: With patient Time For Goal Achievement: 08/02/19 Potential to Achieve Goals: Good    Frequency Min 2X/week   Barriers to discharge   has both family support and accessibility    Co-evaluation               AM-PAC PT "6 Clicks" Mobility  Outcome Measure Help needed turning from your back to your side while in a flat bed without using bedrails?: None Help needed moving from lying on your back to sitting on the side of a flat bed without using bedrails?: A Little Help needed moving to and from a bed to a chair (including a wheelchair)?: A Little Help needed standing up from a chair using your arms (e.g., wheelchair or bedside chair)?: A Little Help needed to walk in hospital room?: A Little Help needed climbing 3-5 steps with a railing? : A Lot 6 Click Score: 18    End of Session Equipment Utilized During Treatment: Gait belt;Oxygen Activity Tolerance: Treatment limited secondary to medical  complications (Comment) Patient left: in bed;with call bell/phone within reach;with bed alarm set Nurse Communication: Mobility status(shared sats as recorded) PT Visit Diagnosis: Unsteadiness on feet (R26.81);Muscle weakness (generalized) (M62.81);Difficulty in walking, not elsewhere classified (R26.2)    Time: 1030-1104 PT Time Calculation (min) (ACUTE ONLY): 34 min   Charges:   PT Evaluation $PT Eval Moderate Complexity: 1 Mod PT Treatments $Gait Training: 8-22 mins       Ramond Dial 07/19/2019, 12:48 PM  Mee Hives, PT MS Acute Rehab Dept. Number: [place]

## 2019-07-20 ENCOUNTER — Encounter: Payer: Self-pay | Admitting: *Deleted

## 2019-07-20 DIAGNOSIS — K31819 Angiodysplasia of stomach and duodenum without bleeding: Secondary | ICD-10-CM

## 2019-07-20 DIAGNOSIS — N178 Other acute kidney failure: Secondary | ICD-10-CM

## 2019-07-20 MED ORDER — FUROSEMIDE 80 MG PO TABS: 80.0000 mg | ORAL_TABLET | Freq: Every day | ORAL | 0 refills | Status: AC

## 2019-07-20 MED ORDER — FUROSEMIDE 40 MG PO TABS
80.0000 mg | ORAL_TABLET | Freq: Every day | ORAL | Status: DC
  Administered 2019-07-20: 80 mg via ORAL
  Filled 2019-07-20: qty 2

## 2019-07-20 MED ORDER — LEVOTHYROXINE SODIUM 50 MCG PO TABS: 50.0000 ug | ORAL_TABLET | Freq: Every day | ORAL | 0 refills | Status: DC

## 2019-07-20 MED ORDER — NEPRO/CARBSTEADY PO LIQD: 237.0000 mL | Freq: Two times a day (BID) | ORAL | 0 refills | Status: DC

## 2019-07-20 NOTE — Progress Notes (Signed)
Discharge order received. Patient mental status is at baseline. Vital signs stable . No signs of acute distress. Discharge instructions given. Patient verbalized understanding. No other issues noted at this time.   

## 2019-07-20 NOTE — Progress Notes (Signed)
   07/20/19 1400  Clinical Encounter Type  Visited With Patient  Visit Type Follow-up  Referral From Chaplain  Consult/Referral To Chaplain  While doing rounds Chaplain briefly visited with patient. His cover was falling on the floor. Patient said this is not your job. Chaplain explained her job was to help make him comfortable and she straighten up the blankets pulling pulling them up to make sure patient was comfortable.

## 2019-07-20 NOTE — Discharge Summary (Addendum)
[facility] [place] NAME: Alex Larson    MR#:  [id number]  DATE OF BIRTH:  Apr 15, 1927  DATE OF ADMISSION:  07/16/2019 ADMITTING PHYSICIAN: Alex Mormon, MD  DATE OF DISCHARGE: 07/20/2019  PRIMARY CARE PHYSICIAN: Alex Haven, MD    ADMISSION DIAGNOSIS:  Melena [K92.1] GI bleeding [K92.2] Elevated troponin level [R77.8] Symptomatic anemia [D64.9] Chronic heart failure, unspecified heart failure type ([place]) [I50.9] Acute renal failure superimposed on chronic kidney disease, unspecified CKD stage, unspecified acute renal failure type ([place]) [N17.9, N18.9]  DISCHARGE DIAGNOSIS:  GI bleeding--due Duodenal AVM's s/p cauterization Acute on CKD-V--followed by Nephrology Severe Ischemic CMP EF 10-15% Acute on chronic CHF-systolic hypothyroidism SECONDARY DIAGNOSIS:   Past Medical History:  Diagnosis Date  . CAD (coronary artery disease)   . CKD (chronic kidney disease), stage IV ([place])   . HFrEF (heart failure with reduced ejection fraction) ([place])    a. 10/2018 Echo: EF 10-15%, DD. Diff HK. RVSP 70.4 mmHg. Mildly dil LA. Mod dil RA. Mod-sev TR. Ao sclerosis w/o stenosis.  . Hypertension   . Ischemic cardiomyopathy   . PAD (peripheral artery disease) ([name])    a. s/p RLE bypass.    HOSPITAL COURSE:   Alex Larson a86 y.o.African-American malewith a known history of coronary artery disease, stage V chronic kidney disease, heart failure with reduced ejection fraction, hypertension and ischemic cardiomyopathy who presented to the emergency room with acute onset of worsening dyspnea since yesterday p.m. with no cough or wheezing. He saw Alex. Juleen Larson in clinic 2 days ago and lab results cameback tonight which indicated a hemoglobin of 6 which is acutely decreased from prior  1. GI bleeding with subsequent acute blood loss anemia on top of chronic anemia. -no active bleeding per pt -has had issues with melonotic stools  however is on iron supplements also. -found to have hemoglobin of 6.0--- two unit blood transfusion-- 7.4-- 7.2--7.6--2nd unit today (has severe cardiomyopathy and would like Hgb >8.0)-- 8.6 -hgb 11.2 in may 2020 - on IV Protonix drip. -hold asa per GI for now at discahrge --Seen by G.I. Alex Alex Larson - EGD- Multiple non-bleeding angioectasias in the duodenum.  Treated with argon plasma coagulation (APC). - Normal duodenal bulb. - Normal stomach. - Normal gastroesophageal   -colonoscopy Severe diverticulosis in the sigmoid colon and in  the descending colon. There was no evidence of  diverticular bleeding. - Non-bleeding external and internal hemorrhoids.  -Capsule endoscopy Capsule retained in stomach for 4 hrs which is above normal as pt is not ambulatory.Image of the cecum not captured as length of recorder time is 8hrs only Scattered lymphangiectasias are identified. A tiny non bleeding red spot is identified, likely of no clinical significance. No active bleeding present  -received Epogen on 07/17/19  2. Stage V chronic kidney disease. -seen by Nephrology--to d/w pt and dter given severe CHF (EF 10%) whether they would like HD or cont med mnx -lasix 80 mg daily  3. Hypertension. -Coreg,Norvasc --held Entresto with creat so high--confirmed with Alex Larson--ok to resume at discharge  4. Acute on chronicSevere Cardiomyopathy/ Chronic Ischemic cardiomyopathy EF 10-15% by echo may 2020 - follows with [facility] cardiology -LAsix 20 mg IV x1 today with elevated BNP 3400 and severe anemia -on chronic home oxygen -cont coreg and norvasc -enteresto now resumed at d/c -?spironolactone - on chronic home oxygen -f/u [facility] cardiology  5. Dyslipidemia. -Statin therapy will be resumed.  6. DVT prophylaxis. -Medical DVT  prophylaxis is currently  contraindicated due to GI bleeding. -cont SCDs.  7. Nutrition Status: Nutrition Problem: Severe Malnutrition Etiology: chronic illness(CKD stage V, CHF) Signs/Symptoms: severe fat depletion, severe muscle depletion Interventions: Refer to RD note for recommendations  8. Severe Hypothyroidism -TSH 203 -started on synthroid 50 mcg daily. PCP to f/u labs   Procedures: EGD,Colonoscopy and Cap study Family communication :dter Alex Larson on the phone Consults : G.I. Discharge Disposition : home with HHPT today CODE STATUS: FULL Code-- d/w HCPOA dter DVT Prophylaxis : SCD Barriers to discharge none  Overall best at baseline for d/c  CONSULTS OBTAINED:    DRUG ALLERGIES:  No Known Allergies  DISCHARGE MEDICATIONS:   Allergies as of 07/20/2019   No Known Allergies     Medication List    STOP taking these medications   aspirin EC 81 MG tablet   indapamide 2.5 MG tablet Commonly known as: LOZOL     TAKE these medications   amLODipine 5 MG tablet Commonly known as: NORVASC Take 1 tablet (5 mg total) by mouth daily.   atorvastatin 40 MG tablet Commonly known as: LIPITOR Take 1 tablet (40 mg total) by mouth daily.   carvedilol 3.125 MG tablet Commonly known as: COREG Take 1 tablet (3.125 mg total) by mouth 2 (two) times daily with a meal.   feeding supplement (NEPRO CARB STEADY) Liqd Take 237 mLs by mouth 2 (two) times daily between meals.   ferrous sulfate 220 (44 Fe) MG/5ML solution Take 220 mg by mouth daily.   furosemide 80 MG tablet Commonly known as: LASIX Take 1 tablet (80 mg total) by mouth daily. What changed:   medication strength  how much to take  when to take this   isosorbide mononitrate 10 MG tablet Commonly known as: ISMO Take 1 tablet by mouth once daily   levothyroxine 50 MCG tablet Commonly known as: SYNTHROID Take 1 tablet (50 mcg total) by mouth daily at 6 (six) AM. Start taking on: July 21, 2019    multivitamin-iron-minerals-folic acid chewable tablet Chew 1 tablet by mouth daily.   pantoprazole 40 MG tablet Commonly known as: PROTONIX Take 1 tablet (40 mg total) by mouth daily.   sacubitril-valsartan 49-51 MG Commonly known as: ENTRESTO Take 1 tablet by mouth 2 (two) times daily.            Durable Medical Equipment  (From admission, onward)         Start     Ordered   07/20/19 0944  For home use only DME Walker rolling  Once    Question Answer Comment  Walker: With 5 Inch Wheels   Patient needs a walker to treat with the following condition Weakness      07/20/19 0944          If you experience worsening of your admission symptoms, develop shortness of breath, life threatening emergency, suicidal or homicidal thoughts you must seek medical attention immediately by calling 911 or calling your MD immediately  if symptoms less severe.  You Must read complete instructions/literature along with all the possible adverse reactions/side effects for all the Medicines you take and that have been prescribed to you. Take any new Medicines after you have completely understood and accept all the possible adverse reactions/side effects.   Please note  You were cared for by a hospitalist during your hospital stay. If you have any questions about your discharge medications or the care you received while you were in the hospital after you are discharged, you can  call the unit and asked to speak with the hospitalist on call if the hospitalist that took care of you is not available. Once you are discharged, your primary care physician will handle any further medical issues. Please note that NO REFILLS for any discharge medications will be authorized once you are discharged, as it is imperative that you return to your primary care physician (or establish a relationship with a primary care physician if you do not have one) for your aftercare needs so that they can reassess your need for  medications and monitor your lab values. Today   SUBJECTIVE   No new complaints  VITAL SIGNS:  Blood pressure (!) 112/54, pulse (!) 55, temperature 97.9 F (36.6 C), temperature source Oral, resp. rate 18, height 6\' 2"  (1.88 m), weight 75.5 kg, SpO2 100 %.  I/O:    Intake/Output Summary (Last 24 hours) at 07/20/2019 1421 Last data filed at 07/20/2019 1100 Gross per 24 hour  Intake 567.43 ml  Output 480 ml  Net 87.43 ml    PHYSICAL EXAMINATION:  GENERAL:  86 y.o.-year-old patient lying in the bed with no acute distress. Weak, pallor+ EYES: Pupils equal, round, reactive to light and accommodation. No scleral icterus.  HEENT: Head atraumatic, normocephalic. Oropharynx and nasopharynx clear.  NECK:  Supple, no jugular venous distention. No thyroid enlargement, no tenderness.  LUNGS: Normal breath sounds bilaterally, no wheezing, rales,rhonchi or crepitation. No use of accessory muscles of respiration.  CARDIOVASCULAR: S1, S2 normal. No murmurs, rubs, or gallops.  ABDOMEN: Soft, non-tender, non-distended. Bowel sounds present. No organomegaly or mass.  EXTREMITIES: + pedal edema,no cyanosis, or clubbing.  NEUROLOGIC: Cranial nerves II through XII are intact. Muscle strength 4+/5 in all extremities. Sensation intact. Gait not checked.  PSYCHIATRIC:  patient is alert and oriented x 3.  SKIN: No obvious rash, lesion, or ulcer.   DATA REVIEW:   CBC  Recent Labs  Lab 07/17/19 0912 07/17/19 1323 07/18/19 0221 07/18/19 0221 07/19/19 0515  WBC 4.4  --   --   --   --   HGB 7.4*   < > 7.6*   < > 8.6*  HCT 22.6*   < > 23.7*  --   --   PLT 189  --   --   --   --    < > = values in this interval not displayed.    Chemistries  Recent Labs  Lab 07/16/19 2307 07/17/19 0029 07/17/19 0219 07/19/19 0515  NA 140  --    < > 144  K 4.7  --    < > 3.8  CL 108  --    < > 113*  CO2 18*  --    < > 22  GLUCOSE 134*  --    < > 68*  BUN 87*  --    < > 74*  CREATININE 4.68*  --    < >  4.67*  CALCIUM 10.3  --    < > 9.9  MG  --  2.1  --   --   AST 45*  --   --   --   ALT 22  --   --   --   ALKPHOS 67  --   --   --   BILITOT 0.9  --   --   --    < > = values in this interval not displayed.    Microbiology Results   Recent Results (from the past 240 hour(s))  Respiratory  Panel by RT PCR (Flu A&B, Covid) - Nasopharyngeal Swab     Status: None   Collection Time: 07/16/19 11:44 PM   Specimen: Nasopharyngeal Swab  Result Value Ref Range Status   SARS Coronavirus 2 by RT PCR NEGATIVE NEGATIVE Final    Comment: (NOTE) SARS-CoV-2 target nucleic acids are NOT DETECTED. The SARS-CoV-2 RNA is generally detectable in upper respiratoy specimens during the acute phase of infection. The lowest concentration of SARS-CoV-2 viral copies this assay can detect is 131 copies/mL. A negative result does not preclude SARS-Cov-2 infection and should not be used as the sole basis for treatment or other patient management decisions. A negative result may occur with  improper specimen collection/handling, submission of specimen other than nasopharyngeal swab, presence of viral mutation(s) within the areas targeted by this assay, and inadequate number of viral copies (<131 copies/mL). A negative result must be combined with clinical observations, patient history, and epidemiological information. The expected result is Negative. Fact Sheet for Patients:  PinkCheek.be Fact Sheet for Healthcare Providers:  GravelBags.it This test is not yet ap proved or cleared by the [place] FDA and  has been authorized for detection and/or diagnosis of SARS-CoV-2 by FDA under an Emergency Use Authorization (EUA). This EUA will remain  in effect (meaning this test can be used) for the duration of the COVID-19 declaration under Section 564(b)(1) of the Act, 21 U.S.C. section 360bbb-3(b)(1), unless the authorization is terminated or revoked  sooner.    Influenza A by PCR NEGATIVE NEGATIVE Final   Influenza B by PCR NEGATIVE NEGATIVE Final    Comment: (NOTE) The Xpert Xpress SARS-CoV-2/FLU/RSV assay is intended as an aid in  the diagnosis of influenza from Nasopharyngeal swab specimens and  should not be used as a sole basis for treatment. Nasal washings and  aspirates are unacceptable for Xpert Xpress SARS-CoV-2/FLU/RSV  testing. Fact Sheet for Patients: PinkCheek.be Fact Sheet for Healthcare Providers: GravelBags.it This test is not yet approved or cleared by the [place] FDA and  has been authorized for detection and/or diagnosis of SARS-CoV-2 by  FDA under an Emergency Use Authorization (EUA). This EUA will remain  in effect (meaning this test can be used) for the duration of the  Covid-19 declaration under Section 564(b)(1) of the Act, 21  U.S.C. section 360bbb-3(b)(1), unless the authorization is  terminated or revoked. Performed at [facility], [address], [place], [place] [address]     RADIOLOGY:  No results found.   CODE STATUS:     Code Status Orders  (From admission, onward)         Start     Ordered   07/18/19 0848  Full code  Continuous     07/18/19 0847        Code Status History    Date Active Date Inactive Code Status Order ID Comments User Context   07/17/2019 0138 07/18/2019 0847 DNR [id number]  Alex Mormon, MD ED   10/30/2018 1444 11/02/2018 1720 DNR [id number]  Asencion Gowda, NP Inpatient   10/27/2018 0530 10/30/2018 1444 Full Code [id number]  Mayer Camel, NP ED   Advance Care Planning Activity    Advance Directive Documentation     Most Recent Value  Type of Advance Directive  Healthcare Power of Attorney  Pre-existing out of facility DNR order (yellow form or pink MOST form)  --  "MOST" Form in Place?  --       TOTAL TIME TAKING CARE OF THIS PATIENT: *40*  minutes.    Fritzi Mandes M.D  [facility]    CC: Primary care physician; Alex Haven, MD

## 2019-07-20 NOTE — Discharge Instructions (Signed)
Use your home oxygen as before

## 2019-07-20 NOTE — TOC Initial Note (Signed)
Transition of Care [facility]) - Initial/Assessment Note    Patient Details  Name: Alex Larson MRN: [id number] Date of Birth: 04/04/1927  Transition of [facility]) CM/SW Contact:    Beverly Sessions, RN Phone Number: 07/20/2019, 1:44 PM  Clinical Narrative:                 Patient admitted from home with GI bleeding  Patient lives at home with daughter.  Daughter provides transportation to appointments  pcp [place] - denies issues obtaining medications  Patient has cane, shower seat, WC and O2 (through [employer])   RW delivered to room prior to discharge by Leroy Sea with [employer]  Daughter agreeable to home health services.  States she does not have a preference of home health agency. Referral [facility] to [facility] with [facility]    Expected Discharge Plan: Home w Home Health Services Barriers to Discharge: No Barriers Identified   Patient Goals and CMS Choice     Choice offered to / list presented to : Patient  Expected Discharge Plan and Services Expected Discharge Plan: [place]       Living arrangements for the past 2 months: Single Family Home Expected Discharge Date: 07/20/19               DME Arranged: Gilford Rile rolling DME Agency: [employer] Date DME Agency Contacted: 07/20/19   Representative spoke with at DME Agency: [place]: PT [place]: [place] Date [facility] Agency Contacted: 07/20/19   Representative spoke with at [place]: [name]  Prior Living Arrangements/Services Living arrangements for the past 2 months: [place] Lives with:: Adult Children Patient language and need for interpreter reviewed:: Yes Do you feel safe going back to the place where you live?: Yes      Need for Family Participation in Patient Care: Yes (Comment) Care giver support system in place?: Yes (comment)   Criminal Activity/Legal Involvement Pertinent to Current Situation/Hospitalization: No - Comment as needed  Activities of  Daily Living Home Assistive Devices/Equipment: Cane (specify quad or straight), Shower chair with back ADL Screening (condition at time of admission) Patient's cognitive ability adequate to safely complete daily activities?: Yes Is the patient deaf or have difficulty hearing?: Yes Does the patient have difficulty seeing, even when wearing glasses/contacts?: No Does the patient have difficulty concentrating, remembering, or making decisions?: No Patient able to express need for assistance with ADLs?: Yes Does the patient have difficulty dressing or bathing?: No Independently performs ADLs?: Yes (appropriate for developmental age) Does the patient have difficulty walking or climbing stairs?: Yes Weakness of Legs: None Weakness of Arms/Hands: None  Permission Sought/Granted                  Emotional Assessment       Orientation: : Fluctuating Orientation (Suspected and/or reported [place])   Psych Involvement: No (comment)  Admission diagnosis:  Melena [K92.1] GI bleeding [K92.2] Elevated troponin level [R77.8] Symptomatic anemia [D64.9] Chronic heart failure, unspecified heart failure type ([place]) [I50.9] Acute renal failure superimposed on chronic kidney disease, unspecified CKD stage, unspecified acute renal failure type ([place]) [N17.9, N18.9] Patient Active Problem List   Diagnosis Date Noted  . AVM (arteriovenous malformation) of duodenum, acquired   . Chronic heart failure ([place])   . GI bleeding 07/17/2019  . Protein-calorie malnutrition, severe 07/17/2019  . Symptomatic anemia   . Melena   . Acute renal failure superimposed on chronic kidney disease ([place])   . GERD (  gastroesophageal reflux disease) 06/04/2019  . Peripheral vascular disease, unspecified ([place]) 02/24/2019  . Hypertension 02/24/2019  . History of anemia 11/29/2018  . Chronic respiratory failure with hypoxia ([place])   . Hypothyroidism   . Coronary artery disease involving native coronary artery of native  heart without angina pectoris   . CKD (chronic kidney disease) stage 4, GFR 15-29 ml/min ([name])   . Chronic combined systolic and diastolic heart failure ([place]) 10/27/2018   PCP:  Leone Haven, MD Pharmacy:   [facility] [address] [place] [address] [place] [address] Phone: [phone] Fax: [phone]     Social Determinants of Health (SDOH) Interventions    Readmission Risk Interventions No flowsheet data found.

## 2019-07-20 NOTE — Care Management Important Message (Signed)
Important Message  Patient Details  Name: Alex Larson MRN: [id number] Date of Birth: February 19, 1927   Medicare Important Message Given:  Yes     Dannette Barbara 07/20/2019, 11:42 AM

## 2019-07-21 ENCOUNTER — Ambulatory Visit: Payer: Medicare PPO | Admitting: Family

## 2019-07-22 ENCOUNTER — Other Ambulatory Visit: Payer: Self-pay

## 2019-07-22 ENCOUNTER — Ambulatory Visit (INDEPENDENT_AMBULATORY_CARE_PROVIDER_SITE_OTHER): Payer: Medicare PPO | Admitting: Family Medicine

## 2019-07-22 ENCOUNTER — Encounter: Payer: Self-pay | Admitting: Family Medicine

## 2019-07-22 DIAGNOSIS — E43 Unspecified severe protein-calorie malnutrition: Secondary | ICD-10-CM | POA: Diagnosis not present

## 2019-07-22 DIAGNOSIS — N178 Other acute kidney failure: Secondary | ICD-10-CM | POA: Diagnosis not present

## 2019-07-22 DIAGNOSIS — K921 Melena: Secondary | ICD-10-CM | POA: Diagnosis not present

## 2019-07-22 DIAGNOSIS — E039 Hypothyroidism, unspecified: Secondary | ICD-10-CM

## 2019-07-22 DIAGNOSIS — N185 Chronic kidney disease, stage 5: Secondary | ICD-10-CM

## 2019-07-22 NOTE — Progress Notes (Signed)
Virtual Visit via video Note  This visit type was conducted due to national recommendations for restrictions regarding the COVID-19 pandemic (e.g. social distancing).  This format is felt to be most appropriate for this patient at this time.  All issues noted in this document were discussed and addressed.  No physical exam was performed (except for noted visual exam findings with Video Visits).   I connected with Alex Larson today at 11:30 AM EST by a video enabled telemedicine application and verified that I am speaking with the correct person using two identifiers. Location patient: home Location provider: work or home office Persons participating in the virtual visit: patient, provider, daughter   I discussed the limitations, risks, security and privacy concerns of performing an evaluation and management service by telephone and the availability of in person appointments. I also discussed with the patient that there may be a patient responsible charge related to this service. The patient expressed understanding and agreed to proceed.  Reason for visit: hospital follow-up, GI bleed, CKD, malnutrition, hypothyroidism  HPI: Patient found to have significant anemia on lab work through nephrology.  He was sent to the emergency room and found to have a GI bleed.  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  Hemoglobin was initially 6 and he was transfused a total of 4 units.  Hemoglobin had improved on discharge.  Goal hemoglobin of greater than 8.  He was also given Epogen.  His daughter notes he was feeling weak and lethargic prior to going the hospital.  They feel like he is getting a little bit stronger since discharge.  His appetite is not quite as good as it was prior to the hospital and she notes he has only had about 2 meals since coming home.  He is drinking well.  No abdominal pain or nausea.  No chest pain.  He has chronic dyspnea that is stable.  His stools are black related  to being on iron.  No blood in his stools.  They note he does not have a whole lot of energy and has been sleeping a lot.  Patient was started on Synthroid 50 mcg daily.  TSH was found to be significantly elevated.  He was also diagnosed with severe malnutrition.  It was recommended that he start on Nepro nutritional supplements twice daily.  They have not been able to locate those.  He denies depression.   ROS: See pertinent positives and negatives per HPI.  Past Medical History:  Diagnosis Date  . CAD (coronary artery disease)   . CKD (chronic kidney disease), stage IV ([place])   . HFrEF (heart failure with reduced ejection fraction) ([place])    a. 10/2018 Echo: EF 10-15%, DD. Diff HK. RVSP 70.4 mmHg. Mildly dil LA. Mod dil RA. Mod-sev TR. Ao sclerosis w/o stenosis.  . Hypertension   . Ischemic cardiomyopathy   . PAD (peripheral artery disease) ([name])    a. s/p RLE bypass.    Past Surgical History:  Procedure Laterality Date  . BYPASS GRAFT     Right lower extremity  . COLONOSCOPY WITH PROPOFOL N/A 07/18/2019   Procedure: COLONOSCOPY WITH PROPOFOL;  Surgeon: Lin Landsman, MD;  Location: [facility] ENDOSCOPY;  Service: Gastroenterology;  Laterality: N/A;  . CORONARY STENT INTERVENTION     x2 in [place].  Details unknown.  . ESOPHAGOGASTRODUODENOSCOPY (EGD) WITH PROPOFOL N/A 07/18/2019   Procedure: ESOPHAGOGASTRODUODENOSCOPY (EGD) WITH PROPOFOL;  Surgeon: Lin Landsman, MD;  Location: [place];  Service: Gastroenterology;  Laterality: N/A;  . REPLACEMENT TOTAL KNEE BILATERAL      Family History  Problem Relation Age of Onset  . Breast cancer Mother     SOCIAL HX: Former smoker   Current Outpatient Medications:  .  amLODipine (NORVASC) 5 MG tablet, Take 1 tablet (5 mg total) by mouth daily., Disp: 90 tablet, Rfl: 3 .  atorvastatin (LIPITOR) 40 MG tablet, Take 1 tablet (40 mg total) by mouth daily., Disp: 90 tablet, Rfl: 3 .  carvedilol (COREG) 3.125 MG tablet, Take 1  tablet (3.125 mg total) by mouth 2 (two) times daily with a meal., Disp: 60 tablet, Rfl: 5 .  ferrous sulfate 220 (44 Fe) MG/5ML solution, Take 220 mg by mouth daily., Disp: , Rfl:  .  furosemide (LASIX) 80 MG tablet, Take 1 tablet (80 mg total) by mouth daily., Disp: 30 tablet, Rfl: 0 .  isosorbide mononitrate (ISMO) 10 MG tablet, Take 1 tablet by mouth once daily, Disp: 90 tablet, Rfl: 3 .  levothyroxine (SYNTHROID) 50 MCG tablet, Take 1 tablet (50 mcg total) by mouth daily at 6 (six) AM., Disp: 30 tablet, Rfl: 0 .  multivitamin-iron-minerals-folic acid (CENTRUM) chewable tablet, Chew 1 tablet by mouth daily., Disp: , Rfl:  .  Nutritional Supplements (FEEDING SUPPLEMENT, NEPRO CARB STEADY,) LIQD, Take 237 mLs by mouth 2 (two) times daily between meals., Disp: 237 mL, Rfl: 0 .  pantoprazole (PROTONIX) 40 MG tablet, Take 1 tablet (40 mg total) by mouth daily., Disp: 90 tablet, Rfl: 1 .  sacubitril-valsartan (ENTRESTO) 49-51 MG, Take 1 tablet by mouth 2 (two) times daily., Disp: 60 tablet, Rfl: 5 .  Nutritional Supplements (NEPRO) LIQD, Take 237 mLs by mouth 2 (two) times daily between meals.,, Disp: 1000 mL, Rfl: 6  EXAM:  VITALS per patient if applicable:  GENERAL: alert, oriented, appears well and in no acute distress  HEENT: atraumatic, conjunttiva clear, no obvious abnormalities on inspection of external nose and ears  NECK: normal movements of the head and neck  LUNGS: on inspection no signs of respiratory distress, breathing rate appears normal, no obvious gross SOB, gasping or wheezing  CV: no obvious cyanosis  MS: moves all visible extremities without noticeable abnormality  PSYCH/NEURO: pleasant and cooperative, no obvious depression or anxiety, speech and thought processing grossly intact  ASSESSMENT AND PLAN:  Discussed the following assessment and plan:  GI bleeding GI bleed resulting in anemia.  They note no active bleeding currently.  Black stools are likely related to  iron.  We will plan on checking labs early next week.  Advised to seek medical attention for any recurrent bleeding.  Acute renal failure superimposed on chronic kidney disease ([place]) Renal function relatively stable.  Will touch base with his nephrologist to determine when he needs to come in for follow-up.  His uremia certainly could be contributing to him feeling lethargic though his anemia could also play a role and the thyroid dysfunction could be playing a role in that as well.  Hypothyroidism Continue on Synthroid 50 mcg daily.  Plan to check TSH in about 6 weeks.  Protein-calorie malnutrition, severe Severe protein calorie malnutrition.  Will attempt to get them Nepro supplements through home health.  Advised that they can do high-calorie foods as long as there is not significant salt in them.  Discussed giving him things to eat that he likes so that he will eat adequately.   The patient's daughter wonders if he should get the Covid vaccine as scheduled in 2 days.  I discussed that there is the potential that he could feel sick after getting this and feel even more poorly than he already does.  It is also possible that his immune system may be suppressed with how ill he has been recently and that he may mount a bigger response if he has recovered some.  Discussed that the benefit of getting the vaccine is that he would likely have some protection against Covid.  We discussed that if they are able to reschedule to get the vaccine in the next couple of weeks I think that would be reasonable to give him more time to recover from his recent illness.  Orders Placed This Encounter  Procedures  . CBC    Standing Status:   Future    Standing Expiration Date:   07/25/2020  . Comp Met (CMET)    Standing Status:   Future    Standing Expiration Date:   07/25/2020    No orders of the defined types were placed in this encounter.    I discussed the assessment and treatment plan with the patient. The  patient was provided an opportunity to ask questions and all were answered. The patient agreed with the plan and demonstrated an understanding of the instructions.   The patient was advised to call back or seek an in-person evaluation if the symptoms worsen or if the condition fails to improve as anticipated.  I have spent 33 minutes in the care of this patient on the day of service regarding chart review, history taking, and counseling on nutrition and Covid vaccine.   [name], MD

## 2019-07-23 ENCOUNTER — Ambulatory Visit: Payer: Medicare PPO | Admitting: Family

## 2019-07-23 ENCOUNTER — Telehealth: Payer: Self-pay | Admitting: Family Medicine

## 2019-07-23 NOTE — Telephone Encounter (Addendum)
This is the patient we discussed regarding the Nepro meal replacement shakes. Can you place a DME order for this so I can sign it and then we can get it to the medical supply store for them? Could you also call the patient and his daughter and let her know that we are getting this taken care of for them? The patient lives with his daughter. It will be for the same nutritional supplement prescription that is already in his chart. Thanks.

## 2019-07-24 ENCOUNTER — Telehealth: Payer: Self-pay | Admitting: Family Medicine

## 2019-07-24 ENCOUNTER — Telehealth: Payer: Self-pay

## 2019-07-24 MED ORDER — NEPRO PO LIQD: ORAL | 6 refills | Status: AC

## 2019-07-24 NOTE — Telephone Encounter (Signed)
Verbal order can be given. Please see what his O2 setting is currently. He may needs a number of liters. We need to get lab work scheduled for him early next week as well. Please see if he is having any respiratory symptoms. Cough, congestion, shortness of breath? Thanks.

## 2019-07-24 NOTE — Telephone Encounter (Signed)
Good to hear. Thanks for checking.

## 2019-07-24 NOTE — Telephone Encounter (Signed)
Alex Larson from [facility], [id number]. Plan is for 2 times for 3 weeks, and 1 time a week for 3 weeks for physical therapy. When walking house oxgyen decrease to 85% and also when sitting takes 1 to 2 mins. To come back up to 94%. Two level 3 interactions. 1st is sacubitril-valsartan (ENTRESTO) 49-51 MG  And the the other one is furosemide (LASIX) 80 MG tablet. Please call her for the verbal order on the physical therapy.

## 2019-07-24 NOTE — Telephone Encounter (Signed)
Script printed .Given to PCP please advise family of medical store I would say the one on [address] [place].

## 2019-07-24 NOTE — Telephone Encounter (Signed)
Alex Larson from [facility], [id number]. Plan is for 2 times for 3 weeks, and 1 time a week for 3 weeks for physical therapy. When walking house oxgyen decrease to 85% and also when sitting takes 1 to 2 mins. To come back up to 94%. Two level 3 interactions. 1st is sacubitril-valsartan (ENTRESTO) 49-51 MG  And the the other one is furosemide (LASIX) 80 MG tablet. Please call her for the verbal order on the physical therapy. Can I give verbal order.  Ferguson Gertner,cma

## 2019-07-24 NOTE — Addendum Note (Signed)
Addended by: Nanci Pina on: 07/24/2019 01:26 PM   Modules accepted: Orders

## 2019-07-24 NOTE — Telephone Encounter (Signed)
I received a Rx for Nepro nutritional supplement from the provider and I called around and found that [facility] had the supplement so I faxed the insurance, demographics and the Rx to [facility] and the patient was informed that it would be delivered.  Nina,cma

## 2019-07-24 NOTE — Telephone Encounter (Signed)
I called around and [facility] is the only medical supply that had this supplement, they will deliver to the patient and I called and spoke with the patient's daughter and she understood.  I faxed the RX, the insurance information and the demographics to [facility] today.  Latrenda Irani,cma

## 2019-07-24 NOTE — Telephone Encounter (Signed)
Can you try to find a medical supply store that can order this for the patient with a prescription? Thanks.

## 2019-07-24 NOTE — Progress Notes (Signed)
Follow-up Outpatient Visit Date: 07/27/2019  Primary Care Provider: Leone Haven, MD [address] [place] [address]  Chief Complaint: Follow-up anemia and heart failure  HPI:  Mr. Gaudino is a 86 y.o. male with history of coronary artery disease status post PCI x2 (details unknown), chronic HFrEF, CKD, PAD status post RLE bypass, chronic respiratory failure with hypoxia, and hypertension, who presents for follow-up of CAD and HFrEF.  He was doing well at the time of his last visit in our office in 05/2019.  He was admitted at [facility] earlier this month with acute on chronic anemia (presenting hemoglobin 6.4).  EGD showed multiple angiectasias without active bleeding in the duodenum, which were treated with laser coagulation.  Colonoscopy showed diverticulosis with active bleeding.  Capsule endoscopy showed scattered lymphangiectasias.  Aspirin was discontinued.  Today, Mr. Dubay reports that he is gradually returning back to his baseline after recent hospitalization.  He has not had any chest pain, palpitations, lightheadedness, orthopnea, or edema.  He notes some exertional dyspnea, though this is approaching his baseline.  Leading up to his hospitalization earlier this month, his daughter noticed worsening lethargy, orthopnea, and anorexia.  Mr. Gillihan has been exercising some since returning home, using a stationary bike twice a week for 15 minutes at a time.  He is also going to be starting physical therapy in the near future.  He has not had any obvious rectal bleeding, though he did not have melena or hematochezia leading up to his hospitalization either.  He continues to use supplement oxygen (2L via nasal cannula).  --------------------------------------------------------------------------------------------------  Past Medical History:  Diagnosis Date  . CAD (coronary artery disease)   . CKD (chronic kidney disease), stage IV ([place])   . HFrEF (heart failure with  reduced ejection fraction) ([place])    a. 10/2018 Echo: EF 10-15%, DD. Diff HK. RVSP 70.4 mmHg. Mildly dil LA. Mod dil RA. Mod-sev TR. Ao sclerosis w/o stenosis.  . Hypertension   . Ischemic cardiomyopathy   . PAD (peripheral artery disease) ([name])    a. s/p RLE bypass.   Past Surgical History:  Procedure Laterality Date  . BYPASS GRAFT     Right lower extremity  . COLONOSCOPY WITH PROPOFOL N/A 07/18/2019   Procedure: COLONOSCOPY WITH PROPOFOL;  Surgeon: Lin Landsman, MD;  Location: [facility] ENDOSCOPY;  Service: Gastroenterology;  Laterality: N/A;  . CORONARY STENT INTERVENTION     x2 in [place].  Details unknown.  . ESOPHAGOGASTRODUODENOSCOPY (EGD) WITH PROPOFOL N/A 07/18/2019   Procedure: ESOPHAGOGASTRODUODENOSCOPY (EGD) WITH PROPOFOL;  Surgeon: Lin Landsman, MD;  Location: [place];  Service: Gastroenterology;  Laterality: N/A;  . REPLACEMENT TOTAL KNEE BILATERAL      Current Meds  Medication Sig  . amLODipine (NORVASC) 5 MG tablet Take 1 tablet (5 mg total) by mouth daily.  Marland Kitchen atorvastatin (LIPITOR) 40 MG tablet Take 1 tablet (40 mg total) by mouth daily.  . carvedilol (COREG) 3.125 MG tablet Take 1 tablet (3.125 mg total) by mouth 2 (two) times daily with a meal.  . ferrous sulfate 220 (44 Fe) MG/5ML solution Take 220 mg by mouth daily.  . furosemide (LASIX) 80 MG tablet Take 1 tablet (80 mg total) by mouth daily.  . isosorbide mononitrate (ISMO) 10 MG tablet Take 1 tablet by mouth once daily  . levothyroxine (SYNTHROID) 50 MCG tablet Take 1 tablet (50 mcg total) by mouth daily at 6 (six) AM.  . multivitamin-iron-minerals-folic acid (CENTRUM) chewable tablet Chew 1 tablet  by mouth daily.  . Nutritional Supplements (FEEDING SUPPLEMENT, NEPRO CARB STEADY,) LIQD Take 237 mLs by mouth 2 (two) times daily between meals.  . Nutritional Supplements (NEPRO) LIQD Take 237 mLs by mouth 2 (two) times daily between meals.,  . pantoprazole (PROTONIX) 40 MG tablet Take 1 tablet (40 mg  total) by mouth daily.  . sacubitril-valsartan (ENTRESTO) 49-51 MG Take 1 tablet by mouth 2 (two) times daily.    Allergies: Patient has no known allergies.  Social History   Tobacco Use  . Smoking status: Former Research scientist (life sciences)  . Smokeless tobacco: Former Systems developer  . Tobacco comment: quit age 9, smoked from teenage years  Substance Use Topics  . Alcohol use: Not Currently  . Drug use: Never    Family History  Problem Relation Age of Onset  . Breast cancer Mother     Review of Systems: A 12-system review of systems was performed and was negative except as noted in the HPI.  --------------------------------------------------------------------------------------------------  Physical Exam: BP (!) 110/52 (BP Location: Right Arm, Patient Position: Sitting, Cuff Size: Normal)   Pulse (!) 59   Ht 6\' 2"  (1.88 m)   Wt 164 lb (74.4 kg)   SpO2 98% Comment: on 2L O2  BMI 21.06 kg/m   General: Thin, elderly man, seated in a wheelchair.  He is accompanied by his daughter. Neck: No JVD or HJR. Respiratory: Lungs are clear to auscultation bilaterally without wheezes or crackles. Heart: Regular rate and rhythm with 1/6 systolic murmur.  No rubs or gallops. Abdomen: Soft, nontender, and nondistended without HSM. Extremities: No lower extremity edema.  EKG: Sinus bradycardia (heart rate 59 bpm) with left axis deviation and IVCD.  No significant change from prior tracing on 05/13/2019.  Lab Results  Component Value Date   WBC 4.4 07/17/2019   HGB 8.6 (L) 07/19/2019   HCT 23.7 (L) 07/18/2019   MCV 96.2 07/17/2019   PLT 189 07/17/2019    Lab Results  Component Value Date   NA 144 07/19/2019   K 3.8 07/19/2019   CL 113 (H) 07/19/2019   CO2 22 07/19/2019   BUN 74 (H) 07/19/2019   CREATININE 4.67 (H) 07/19/2019   GLUCOSE 68 (L) 07/19/2019   ALT 22 07/16/2019    Lab Results  Component Value Date   CHOL 187 07/08/2019   HDL 41 07/08/2019   LDLCALC 119 (H) 07/08/2019   TRIG 134  07/08/2019   CHOLHDL 4.6 07/08/2019    --------------------------------------------------------------------------------------------------  ASSESSMENT AND PLAN: Chronic systolic and diastolic heart failure: Mr. Wasley appears euvolemic on exam today following recent hospitalization for symptomatic anemia and volume overload.  He was discharged on furosemide 80 mg p.o. daily (previously on 40 mg twice daily).  I think it is reasonable to continue his current regimen of furosemide as well as carvedilol 3.125 mg twice daily and Entresto 49-51 mg twice daily.  I remain somewhat cautious with continued Entresto use in the setting of advanced CKD, though as he has been tolerating this well and follows closely with Dr. Juleen China, we will defer medication changes at this time.  I will check a basic metabolic panel today to ensure stable renal function and electrolytes.  Acute on chronic blood loss anemia: Duodenal NGO ectasias were thought to be the source and are now status post endoscopic treatment.  Aspirin is on hold, which is reasonable in the short-term.  If cleared by GI, I would favor restarting low-dose aspirin when possible given history of CAD and at least  2 prior PCI's.  We will check a CBC today.  Coronary artery disease: No symptoms to suggest worsening coronary insufficiency.  We will continue with secondary prevention, as tolerated.  As above, I would advocate for restarting aspirin when it is felt safe to do so by gastroenterology.  Chronic kidney disease stage IV: I will check a BMP today to ensure stable renal function given recent titration of furosemide.  We may need to consider stopping Entresto if creatinine has worsened further.  Mr. Lysle Dingwall should continue to follow closely with Dr. Juleen China.  Chronic respiratory failure with hypoxia: This seems to be at baseline.  Mr. Lysle Dingwall should continue supplemental oxygen.  Hypertension: Blood pressure borderline low.  No medication  changes today.  Follow-up: Return to clinic in 6 weeks.  Nelva Bush, MD 07/27/2019 1:34 PM

## 2019-07-26 ENCOUNTER — Telehealth: Payer: Self-pay | Admitting: Family Medicine

## 2019-07-26 NOTE — Assessment & Plan Note (Addendum)
Continue on Synthroid 50 mcg daily.  Plan to check TSH in about 6 weeks.

## 2019-07-26 NOTE — Telephone Encounter (Addendum)
Can you please call the patient and his daughter?  He needs labs on Monday or Tuesday.  I have placed orders.  Can you get those scheduled?  He also needs follow-up with me in about 4 weeks.  Please get him scheduled.  Thanks.  Thanks.

## 2019-07-26 NOTE — Assessment & Plan Note (Signed)
Severe protein calorie malnutrition.  Will attempt to get them Nepro supplements through home health.  Advised that they can do high-calorie foods as long as there is not significant salt in them.  Discussed giving him things to eat that he likes so that he will eat adequately.

## 2019-07-26 NOTE — Assessment & Plan Note (Signed)
Renal function relatively stable.  Will touch base with his nephrologist to determine when he needs to come in for follow-up.  His uremia certainly could be contributing to him feeling lethargic though his anemia could also play a role and the thyroid dysfunction could be playing a role in that as well.

## 2019-07-26 NOTE — Assessment & Plan Note (Signed)
GI bleed resulting in anemia.  They note no active bleeding currently.  Black stools are likely related to iron.  We will plan on checking labs early next week.  Advised to seek medical attention for any recurrent bleeding.

## 2019-07-27 ENCOUNTER — Telehealth: Payer: Self-pay | Admitting: Family Medicine

## 2019-07-27 ENCOUNTER — Telehealth: Payer: Self-pay

## 2019-07-27 ENCOUNTER — Encounter: Payer: Self-pay | Admitting: Internal Medicine

## 2019-07-27 ENCOUNTER — Other Ambulatory Visit: Payer: Self-pay

## 2019-07-27 ENCOUNTER — Ambulatory Visit (INDEPENDENT_AMBULATORY_CARE_PROVIDER_SITE_OTHER): Payer: Medicare PPO | Admitting: Internal Medicine

## 2019-07-27 VITALS — BP 110/52 | HR 59 | Ht 74.0 in | Wt 164.0 lb

## 2019-07-27 DIAGNOSIS — I10 Essential (primary) hypertension: Secondary | ICD-10-CM | POA: Diagnosis not present

## 2019-07-27 DIAGNOSIS — I5042 Chronic combined systolic (congestive) and diastolic (congestive) heart failure: Secondary | ICD-10-CM

## 2019-07-27 DIAGNOSIS — D62 Acute posthemorrhagic anemia: Secondary | ICD-10-CM

## 2019-07-27 DIAGNOSIS — I2510 Atherosclerotic heart disease of native coronary artery without angina pectoris: Secondary | ICD-10-CM

## 2019-07-27 DIAGNOSIS — N184 Chronic kidney disease, stage 4 (severe): Secondary | ICD-10-CM | POA: Diagnosis not present

## 2019-07-27 DIAGNOSIS — I5022 Chronic systolic (congestive) heart failure: Secondary | ICD-10-CM

## 2019-07-27 NOTE — Telephone Encounter (Signed)
-----   Message from Lin Landsman, MD sent at 07/27/2019  2:41 PM EST ----- Yes, I'm ok to resume aspirin 81mg  as benefits overweigh the risks He should follow up with GI in 1 month   Caryl Pina  Please schedule appt with me or Dr Allen Norris in 50month  Thanks RV ----- Message ----- From: Nelva Bush, MD Sent: 07/27/2019   1:44 PM EST To: Lin Landsman, MD, Leone Haven, MD

## 2019-07-27 NOTE — Telephone Encounter (Signed)
I called and spoke with the patient's daughter and she stated that he is seeing 3 doctors Dr. Sunday Corn for nephrology, You his PCP and Dr. Saunders Revel for Cardiology and the patient was seen today by Dr. Saunders Revel and had labs and she stated she cannot keep dragging her father to theses offices for the labs, I informed her that the labs look the same and we would just need the results so I did not schedule labs for the patient but I did schedule a follow up in 1 month.  He is also seeing Dr. Sunday Corn this Wednesday.  Iyanla Eilers,cma

## 2019-07-27 NOTE — Patient Instructions (Signed)
Medication Instructions:  Your physician recommends that you continue on your current medications as directed. Please refer to the Current Medication list given to you today.  *If you need a refill on your cardiac medications before your next appointment, please call your pharmacy*  Lab Work: Your physician recommends that you return for lab work in: TODAY - CBC no diff, BMP.  If you have labs (blood work) drawn today and your tests are completely normal, you will receive your results only by: Marland Kitchen MyChart Message (if you have MyChart) OR . A paper copy in the mail If you have any lab test that is abnormal or we need to change your treatment, we will call you to review the results.  Testing/Procedures: none  Follow-Up: At [facility], you and your health needs are our priority.  As part of our continuing mission to provide you with exceptional heart care, we have created designated Provider Care Teams.  These Care Teams include your primary Cardiologist (physician) and Advanced Practice Providers (APPs -  Physician Assistants and Nurse Practitioners) who all work together to provide you with the care you need, when you need it.  Your next appointment:   6 week(s) - preferably Dr Saunders Revel or Sharolyn Douglas  The format for your next appointment:   In Person  Provider:     You may see Nelva Bush, MD or Murray Hodgkins, NP  Christell Faith, PA-C  Marrianne Mood, PA-C

## 2019-07-27 NOTE — Telephone Encounter (Signed)
Amy Moon from [facility] called and stated the patient had a level 3 pain interaction with the iron and levothyroxine.  Dali Kraner,cma

## 2019-07-27 NOTE — Telephone Encounter (Signed)
Pt's daughter would like a call back. Didn't say what it was regarding. She did mention that you had left her a voicemail.

## 2019-07-27 NOTE — Telephone Encounter (Signed)
LM for the patient to call and schedule labs today or tomorrow and to schedule a 4 week follow up with the provider.  Younique Casad,cma

## 2019-07-27 NOTE — Telephone Encounter (Signed)
Alex Larson from [facility]  [name] she hasn't got a call back yet from the other day and to let Dr. Caryl Bis know that pt had a level 3 pain interaction with iron & levothyroxine.

## 2019-07-27 NOTE — Telephone Encounter (Signed)
Noted  

## 2019-07-27 NOTE — Telephone Encounter (Signed)
I got in touch with Amy from [facility] and gave verbal orders, I have attempted to reach the patient to schedule a lab and a follow up, I left a message on the voicemail.  Eddi Hymes,cma

## 2019-07-27 NOTE — Telephone Encounter (Signed)
I called and spoke with the patient daughter and scheduled a follow up in one month.  Bonny Vanleeuwen,cma

## 2019-07-27 NOTE — Telephone Encounter (Signed)
Patient daughter verbalized understanding about restarting the Asprin 81mg . Made patient a hospital follow up appointment for 08/26/2019 virtual with Dr. Bonna Gains

## 2019-07-28 ENCOUNTER — Telehealth: Payer: Self-pay

## 2019-07-28 ENCOUNTER — Ambulatory Visit: Payer: Medicare PPO | Admitting: Family

## 2019-07-28 LAB — BASIC METABOLIC PANEL
BUN/Creatinine Ratio: 12 (ref 10–24)
BUN: 62 mg/dL — ABNORMAL HIGH (ref 10–36)
CO2: 20 mmol/L (ref 20–29)
Calcium: 10.1 mg/dL (ref 8.6–10.2)
Chloride: 105 mmol/L (ref 96–106)
Creatinine, Ser: 5.23 mg/dL — ABNORMAL HIGH (ref 0.76–1.27)
GFR calc Af Amer: 10 mL/min/{1.73_m2} — ABNORMAL LOW (ref >59)
GFR calc non Af Amer: 9 mL/min/{1.73_m2} — ABNORMAL LOW (ref >59)
Glucose: 89 mg/dL (ref 65–99)
Potassium: 4.3 mmol/L (ref 3.5–5.2)
Sodium: 142 mmol/L (ref 134–144)

## 2019-07-28 LAB — CBC
Hematocrit: 31.8 % — ABNORMAL LOW (ref 37.5–51.0)
Hemoglobin: 10.6 g/dL — ABNORMAL LOW (ref 13.0–17.7)
MCH: 30.3 pg (ref 26.6–33.0)
MCHC: 33.3 g/dL (ref 31.5–35.7)
MCV: 91 fL (ref 79–97)
Platelets: 218 10*3/uL (ref 150–450)
RBC: 3.5 x10E6/uL — ABNORMAL LOW (ref 4.14–5.80)
RDW: 16.8 % — ABNORMAL HIGH (ref 11.6–15.4)
WBC: 5.1 10*3/uL (ref 3.4–10.8)

## 2019-07-28 NOTE — Addendum Note (Signed)
Addended by: Raelene Bott, BRANDY L on: 07/28/2019 03:45 PM   Modules accepted: Orders

## 2019-07-28 NOTE — Telephone Encounter (Signed)
Noted entered for 07/27/2019: Phone call placed to check in after recent hospitalization. Spoke with patient's daughter who shared that patient was out of the hospital and was doing fine. Daughter stated they were currently at the hospital and could not schedule anything at this time.

## 2019-07-28 NOTE — Telephone Encounter (Signed)
Noted  

## 2019-07-28 NOTE — Telephone Encounter (Signed)
I spoke with the daughter and she stated her father was having labs at his cardiology appt.  I informed her to tell them to send the results to [place] as well.  She understood.  Axtyn Woehler,cma

## 2019-07-29 ENCOUNTER — Telehealth: Payer: Self-pay | Admitting: *Deleted

## 2019-07-29 ENCOUNTER — Telehealth: Payer: Self-pay | Admitting: Family Medicine

## 2019-07-29 MED ORDER — ASPIRIN EC 81 MG PO TBEC: 81.0000 mg | DELAYED_RELEASE_TABLET | Freq: Every day | ORAL | 3 refills | Status: AC

## 2019-07-29 NOTE — Telephone Encounter (Signed)
A nurse with [employer] called and states that pt's daughter Malachi Bonds called and was inquiring about shakes that were supposed to be mailed out? Please call back after 3:30 @ [phone]

## 2019-07-29 NOTE — Telephone Encounter (Signed)
-----   Message from Nelva Bush, MD sent at 07/28/2019  6:35 AM EST ----- Please let Mr. [place]ville know that hemoglobin has improved from 8.6 to 10.6.  His creatinine has risen, however.  Given worsening renal function, I recommend stopping Entresto and having him see an APP in ~2 weeks with BMP at that time.  Will forward the results to Dr. Juleen China for his review as well.

## 2019-07-29 NOTE — Telephone Encounter (Signed)
-----   Message from Nelva Bush, MD sent at 07/29/2019  6:45 AM EST ----- Good morning,  Could you let Mr. Kindig know that GI is okay with him restarting aspirin 81 mg daily?  Thanks.  Gerald Stabs ----- Message ----- From: Lin Landsman, MD Sent: 07/27/2019   2:41 PM EST To: Nelva Bush, MD, Leone Haven, MD, #  Yes, I'm ok to resume aspirin 81mg  as benefits overweigh the risks He should follow up with GI in 1 month   Caryl Pina  Please schedule appt with me or Dr Allen Norris in 71month  Thanks RV ----- Message ----- From: Nelva Bush, MD Sent: 07/27/2019   1:44 PM EST To: Lin Landsman, MD, Leone Haven, MD

## 2019-07-29 NOTE — Telephone Encounter (Signed)
Patient's daughter notified and verbalized understanding to start aspirin 81 mg once a day.

## 2019-07-29 NOTE — Telephone Encounter (Signed)
Amy from [facility], [phone]. Amy said pt is on 2 liters of oxygen a day, readings this morning were; 86-91. Litght extra excise oxygen  Level would rise to 91 but then sitting would drop. No short of breathe, fatigue very quickly.  Please advise.    Rhiley Tarver,cma

## 2019-07-29 NOTE — Telephone Encounter (Signed)
Results called to patient's daughter. She verbalized understanding of results and plan of care. She is aware to stop the [facility]. 2 week follow up scheduled.  Patient has appointment with Dr Abigail Butts this afternoon and she will bring up the lab work.

## 2019-07-29 NOTE — Telephone Encounter (Signed)
Alex Larson from [place], [phone]. Alex Larson said pt is on 2 liters of oxygen a day, readings this morning were; 86-91. Litght extra excise oxygen  Level would rise to 91 but then sitting would drop. No short of breathe, fatigue very quickly.

## 2019-07-30 NOTE — Telephone Encounter (Signed)
Noted. Please check back in with them later today and see if they were able to check his oxygen saturation on that setting.

## 2019-07-30 NOTE — Telephone Encounter (Signed)
I called and spoke to the daughter of the patient and informed her to increase the patient's oxygen level to 2.5 or 3 liters per Dr. Caryl Bis, and I informed her that his 02 stat should be 88% or above. Daughter states he is not having any other symptoms no cough, no fever, no congestion.  Alex Larson,cma

## 2019-07-30 NOTE — Telephone Encounter (Signed)
Please let the PT and the patients daughter know that he should increase his oxygen to 2.5 or 3 liters and see if that makes a difference. His goal oxygen saturation should be 88% or above. Please see if he is having any cough or congestion. Any fevers? Any other symptoms?

## 2019-07-30 NOTE — Telephone Encounter (Signed)
This patient is needing the Nepro supplements de discussed. Please let me know if you can get theses samples for the patient.  Thanks  Alex Larson,cma

## 2019-07-30 NOTE — Telephone Encounter (Signed)
I will be able to have samples for patient to pick up on March 3. Nepro Shake has been reformulated to Nepro with Carbsteady- same 237 mL with multiple flavors! Okay to notify patient.

## 2019-07-31 ENCOUNTER — Encounter: Payer: Self-pay | Admitting: Gastroenterology

## 2019-07-31 ENCOUNTER — Telehealth: Payer: Self-pay | Admitting: Family Medicine

## 2019-07-31 NOTE — Telephone Encounter (Signed)
Visit today oxygen was set 3 liters per minute. AT rest oxygen was in upper 90's, with light sitting and exercise decrease to lower 90's, on one time oxygen decrease to 86, with rest and deep breathing it went up fast to greater then 90. Updated medication to include aspirin and there was a level 3 interaction between Asprin and the furosemide (LASIX) 80 MG tablet

## 2019-07-31 NOTE — Telephone Encounter (Signed)
Noted. He should continue with 3 L [place].

## 2019-07-31 NOTE — Telephone Encounter (Signed)
I called na spoke with the patient daughter and informed her that she can pick up this supplement here on August 05, 2018 and coupons and she understood.  I informed her I would call her to pick up.  Jerret Mcbane,cma

## 2019-07-31 NOTE — Telephone Encounter (Signed)
I called and left a VM informing the daughter of the patient to keep the patient on 3 liters of oxygen.  Aaren Krog,cma

## 2019-07-31 NOTE — Telephone Encounter (Signed)
Visit today oxygen was set 3 liters per minute. AT rest oxygen was in upper 90's, with light sitting and exercise decrease to lower 90's, on one time oxygen decrease to 86, with rest and deep breathing it went up fast to greater then 90. Updated medication to include aspirin and there was a level 3 interaction between Asprin and the furosemide (LASIX) 80 MG tablet . Vint Pola,cma

## 2019-08-03 DIAGNOSIS — J9611 Chronic respiratory failure with hypoxia: Secondary | ICD-10-CM | POA: Diagnosis not present

## 2019-08-04 ENCOUNTER — Telehealth: Payer: Self-pay | Admitting: Family Medicine

## 2019-08-04 ENCOUNTER — Telehealth: Payer: Self-pay

## 2019-08-04 NOTE — Telephone Encounter (Signed)
Patients supplements came in today via [facility] and I called the patient's daughter and informed her and she will pick up tomorrow.  Adisynn Suleiman,cma

## 2019-08-04 NOTE — Telephone Encounter (Signed)
[place]  Verbal Orders  Home Health aid to assist with bathing  2x a week

## 2019-08-04 NOTE — Telephone Encounter (Signed)
I called and spoke with [facility] and gave verbal orders for this patient to get help with baths 2 X a week.  Elaria Osias,cma

## 2019-08-04 NOTE — Telephone Encounter (Signed)
[place]   Verbal Orders   Home Health aid to assist with bathing   2x a week Zerah Hilyer,cma

## 2019-08-04 NOTE — Telephone Encounter (Signed)
Verbal orders can be given. 

## 2019-08-11 DIAGNOSIS — Z87891 Personal history of nicotine dependence: Secondary | ICD-10-CM

## 2019-08-11 DIAGNOSIS — N185 Chronic kidney disease, stage 5: Secondary | ICD-10-CM | POA: Diagnosis not present

## 2019-08-11 DIAGNOSIS — K31819 Angiodysplasia of stomach and duodenum without bleeding: Secondary | ICD-10-CM | POA: Diagnosis not present

## 2019-08-11 DIAGNOSIS — E785 Hyperlipidemia, unspecified: Secondary | ICD-10-CM

## 2019-08-11 DIAGNOSIS — E039 Hypothyroidism, unspecified: Secondary | ICD-10-CM

## 2019-08-11 DIAGNOSIS — D631 Anemia in chronic kidney disease: Secondary | ICD-10-CM | POA: Diagnosis not present

## 2019-08-11 DIAGNOSIS — Z96653 Presence of artificial knee joint, bilateral: Secondary | ICD-10-CM

## 2019-08-11 DIAGNOSIS — H9190 Unspecified hearing loss, unspecified ear: Secondary | ICD-10-CM

## 2019-08-11 DIAGNOSIS — K644 Residual hemorrhoidal skin tags: Secondary | ICD-10-CM | POA: Diagnosis not present

## 2019-08-11 DIAGNOSIS — I132 Hypertensive heart and chronic kidney disease with heart failure and with stage 5 chronic kidney disease, or end stage renal disease: Secondary | ICD-10-CM | POA: Diagnosis not present

## 2019-08-11 DIAGNOSIS — I2510 Atherosclerotic heart disease of native coronary artery without angina pectoris: Secondary | ICD-10-CM | POA: Diagnosis not present

## 2019-08-11 DIAGNOSIS — Z955 Presence of coronary angioplasty implant and graft: Secondary | ICD-10-CM

## 2019-08-11 DIAGNOSIS — Z9181 History of falling: Secondary | ICD-10-CM

## 2019-08-11 DIAGNOSIS — I739 Peripheral vascular disease, unspecified: Secondary | ICD-10-CM

## 2019-08-11 DIAGNOSIS — I255 Ischemic cardiomyopathy: Secondary | ICD-10-CM | POA: Diagnosis not present

## 2019-08-11 DIAGNOSIS — K5730 Diverticulosis of large intestine without perforation or abscess without bleeding: Secondary | ICD-10-CM | POA: Diagnosis not present

## 2019-08-11 DIAGNOSIS — Z9981 Dependence on supplemental oxygen: Secondary | ICD-10-CM

## 2019-08-11 DIAGNOSIS — E43 Unspecified severe protein-calorie malnutrition: Secondary | ICD-10-CM

## 2019-08-11 DIAGNOSIS — I5023 Acute on chronic systolic (congestive) heart failure: Secondary | ICD-10-CM | POA: Diagnosis not present

## 2019-08-13 ENCOUNTER — Other Ambulatory Visit
Admission: RE | Admit: 2019-08-13 | Discharge: 2019-08-13 | Disposition: A | Discharge status: Home / Self Care | Payer: Medicare PPO | Attending: Nurse Practitioner | Admitting: Nurse Practitioner

## 2019-08-13 ENCOUNTER — Encounter: Payer: Self-pay | Admitting: Nurse Practitioner

## 2019-08-13 ENCOUNTER — Other Ambulatory Visit: Payer: Self-pay

## 2019-08-13 ENCOUNTER — Ambulatory Visit (INDEPENDENT_AMBULATORY_CARE_PROVIDER_SITE_OTHER): Payer: Medicare PPO | Admitting: Nurse Practitioner

## 2019-08-13 VITALS — BP 120/60 | HR 60 | Ht 74.0 in | Wt 165.1 lb

## 2019-08-13 DIAGNOSIS — E782 Mixed hyperlipidemia: Secondary | ICD-10-CM

## 2019-08-13 DIAGNOSIS — I10 Essential (primary) hypertension: Secondary | ICD-10-CM

## 2019-08-13 DIAGNOSIS — I255 Ischemic cardiomyopathy: Secondary | ICD-10-CM

## 2019-08-13 DIAGNOSIS — N184 Chronic kidney disease, stage 4 (severe): Secondary | ICD-10-CM

## 2019-08-13 DIAGNOSIS — I2510 Atherosclerotic heart disease of native coronary artery without angina pectoris: Secondary | ICD-10-CM | POA: Diagnosis not present

## 2019-08-13 DIAGNOSIS — I5042 Chronic combined systolic (congestive) and diastolic (congestive) heart failure: Secondary | ICD-10-CM | POA: Diagnosis not present

## 2019-08-13 LAB — BASIC METABOLIC PANEL
Anion gap: 11 (ref 5–15)
BUN: 88 mg/dL — ABNORMAL HIGH (ref 8–23)
CO2: 21 mmol/L — ABNORMAL LOW (ref 22–32)
Calcium: 10.6 mg/dL — ABNORMAL HIGH (ref 8.9–10.3)
Chloride: 105 mmol/L (ref 98–111)
Creatinine, Ser: 3.98 mg/dL — ABNORMAL HIGH (ref 0.61–1.24)
GFR calc Af Amer: 14 mL/min — ABNORMAL LOW (ref >60)
GFR calc non Af Amer: 12 mL/min — ABNORMAL LOW (ref >60)
Glucose, Bld: 97 mg/dL (ref 70–99)
Potassium: 4.7 mmol/L (ref 3.5–5.1)
Sodium: 137 mmol/L (ref 135–145)

## 2019-08-13 NOTE — Progress Notes (Signed)
Office Visit    Patient Name: Jevon Shells Date of Encounter: 08/13/2019  Primary Care Provider:  Leone Haven, MD Primary Cardiologist:  Nelva Bush, MD  Chief Complaint    86 y/o ? with a h/o CAD status post PCI x2 (details unknown), HFrEF, stage III chronic kidney disease, peripheral arterial disease status post right lower extremity bypass, chronic respiratory failure with hypoxia, and hypertension, presents for follow-up related to CAD and heart failure.  Past Medical History    Past Medical History:  Diagnosis Date  . CAD (coronary artery disease)   . CKD (chronic kidney disease), stage IV ([place])   . HFrEF (heart failure with reduced ejection fraction) ([place])    a. 10/2018 Echo: EF 10-15%, DD. Diff HK. RVSP 70.4 mmHg. Mildly dil LA. Mod dil RA. Mod-sev TR. Ao sclerosis w/o stenosis.  . Hypertension   . Ischemic cardiomyopathy   . PAD (peripheral artery disease) ([name])    a. s/p RLE bypass.   Past Surgical History:  Procedure Laterality Date  . BYPASS GRAFT     Right lower extremity  . COLONOSCOPY WITH PROPOFOL N/A 07/18/2019   Procedure: COLONOSCOPY WITH PROPOFOL;  Surgeon: Lin Landsman, MD;  Location: [facility];  Service: Gastroenterology;  Laterality: N/A;  . CORONARY STENT INTERVENTION     x2 in [place].  Details unknown.  . ESOPHAGOGASTRODUODENOSCOPY (EGD) WITH PROPOFOL N/A 07/18/2019   Procedure: ESOPHAGOGASTRODUODENOSCOPY (EGD) WITH PROPOFOL;  Surgeon: Lin Landsman, MD;  Location: [place];  Service: Gastroenterology;  Laterality: N/A;  . REPLACEMENT TOTAL KNEE BILATERAL      Allergies  No Known Allergies  History of Present Illness    86 year old male with the above past medical history including CAD status post PCI x2 (details unknown), HFrEF, stage III chronic kidney disease, peripheral arterial disease status post right lower extremity bypass, chronic respiratory failure with hypoxia, and hypertension.  In May 2020, he  was admitted to [facility] with a 1 week history of lower extremity swelling and dyspnea.  He had moved from [place] to [place] about 4 months earlier.  Echocardiogram during admission showed an EF of 10-15%.  Given advanced age and comorbidities (CKD and PAD), cardiac catheterization was not pursued.  He was doing well when seen in the office in December 2020 but in early February, was admitted to [facility] with acute on chronic anemia and a hemoglobin of 6.4.  EGD showed multiple angiectasia's without active bleeding in the duodenum, which were treated with laser coagulation.  Colonoscopy showed diverticulosis with active bleeding.  Capsule endoscopy showed scattered lymphangiectasia's.  Aspirin was discontinued.  He was last seen in clinic on February 22, at which time he was doing relatively well and riding an exercise bike twice a week for 15 minutes.  Follow-up labs that day revealed a BUN of 62 with a creatinine of 5.23, which was slightly above his prior baseline.  Electrolytes were normal.  Hemoglobin had improved to 10.6.  Given worsening renal function, Entresto was discontinued.  Since then, he has followed up with his nephrologist and now has an appointment with vascular surgery on March 19, to discuss hemodialysis access.  Despite coming off of Entresto, his systolic blood pressures have remained between 110 and 120 at home per daughter.  He continues to wear oxygen around-the-clock and does have dyspnea on exertion but this has been stable.  His weight is also has been stable and he denies chest pain, palpitations, PND, orthopnea, dizziness, syncope, edema,  or early satiety.  He continues to ride an exercise bike about 15 minutes twice a week.  Home Medications    Prior to Admission medications   Medication Sig Start Date End Date Taking? Authorizing Provider  amLODipine (NORVASC) 5 MG tablet Take 1 tablet (5 mg total) by mouth daily. 05/09/19  Yes Darylene Price A, FNP  aspirin EC 81  MG tablet Take 1 tablet (81 mg total) by mouth daily. 07/29/19  Yes End, Harrell Gave, MD  atorvastatin (LIPITOR) 40 MG tablet Take 1 tablet (40 mg total) by mouth daily. 05/13/19 08/13/19 Yes Theora Gianotti, NP  carvedilol (COREG) 3.125 MG tablet Take 1 tablet (3.125 mg total) by mouth 2 (two) times daily with a meal. 11/04/18  Yes Darylene Price A, FNP  ferrous sulfate 220 (44 Fe) MG/5ML solution Take 220 mg by mouth daily.   Yes [provider]  furosemide (LASIX) 80 MG tablet Take 1 tablet (80 mg total) by mouth daily. 07/20/19  Yes Fritzi Mandes, MD  isosorbide mononitrate (ISMO) 10 MG tablet Take 1 tablet by mouth once daily 06/27/19  Yes Hackney, Aura Fey, FNP  levothyroxine (SYNTHROID) 50 MCG tablet Take 1 tablet (50 mcg total) by mouth daily at 6 (six) AM. 07/21/19  Yes Fritzi Mandes, MD  multivitamin-iron-minerals-folic acid (CENTRUM) chewable tablet Chew 1 tablet by mouth daily.   Yes [provider]  Nutritional Supplements (FEEDING SUPPLEMENT, NEPRO CARB STEADY,) LIQD Take 237 mLs by mouth 2 (two) times daily between meals. 07/20/19  Yes Fritzi Mandes, MD  Nutritional Supplements (NEPRO) LIQD Take 237 mLs by mouth 2 (two) times daily between meals., 07/24/19  Yes Leone Haven, MD  pantoprazole (PROTONIX) 40 MG tablet Take 1 tablet (40 mg total) by mouth daily. 06/10/19  Yes Leone Haven, MD    Review of Systems    Chronic dyspnea on exertion.  He denies chest pain, palpitations, PND, orthopnea, dizziness, syncope, edema, or early satiety.  He notes occasional low flow nosebleed.  All other systems reviewed and are otherwise negative except as noted above.  Physical Exam    VS:  BP 120/60 (BP Location: Left Arm, Patient Position: Sitting, Cuff Size: Normal)   Pulse 60   Ht 6\' 2"  (1.88 m)   Wt 165 lb 2 oz (74.9 kg)   SpO2 (!) 88% Comment: on 3L O2  BMI 21.20 kg/m  , BMI Body mass index is 21.2 kg/m. GEN: Well nourished, well developed, in no acute  distress. HEENT: normal. Neck: Supple, no JVD, carotid bruits, or masses. Cardiac: RRR, 2 of 6 stock murmur throughout, no rubs, or gallops. No clubbing, cyanosis, edema.  Radials/PT 1+ and equal bilaterally.  Respiratory:  Respirations regular and unlabored, diminished breath sounds bilaterally. GI: Soft, nontender, nondistended, BS + x 4. MS: no deformity or atrophy. Skin: warm and dry, no rash. Neuro:  Strength and sensation are intact. Psych: Normal affect.  Accessory Clinical Findings    ECG personally reviewed by me today -regular sinus rhythm, 60, left axis deviation, right bundle branch block, LVH- no acute changes.  Lab Results  Component Value Date   WBC 5.1 07/27/2019   HGB 10.6 (L) 07/27/2019   HCT 31.8 (L) 07/27/2019   MCV 91 07/27/2019   PLT 218 07/27/2019   Lab Results  Component Value Date   CREATININE 5.23 (H) 07/27/2019   BUN 62 (H) 07/27/2019   NA 142 07/27/2019   K 4.3 07/27/2019   CL 105 07/27/2019   CO2 20  07/27/2019   Lab Results  Component Value Date   ALT 22 07/16/2019   AST 45 (H) 07/16/2019   ALKPHOS 67 07/16/2019   BILITOT 0.9 07/16/2019   Lab Results  Component Value Date   CHOL 187 07/08/2019   HDL 41 07/08/2019   LDLCALC 119 (H) 07/08/2019   TRIG 134 07/08/2019   CHOLHDL 4.6 07/08/2019    Lab Results  Component Value Date   HGBA1C 5.7 (H) 10/27/2018    Assessment & Plan    1.  Chronic combined systolic and diastolic congestive heart failure/ischemic cardiomyopathy: EF of 10 to 15% by echocardiogram May 2020.  Despite progressive renal failure, he has been well compensated from a heart failure standpoint.  He is euvolemic on examination today.  Delene Loll was discontinued following his last visit in February secondary to worsening renal function with a creatinine of 5.23 at that time.  Despite discontinuation of Entresto, blood pressure has been stable at home in the 1 10-1 20 range.  He is 120/60 today.  He remains on low-dose  carvedilol and Lasix 80 mg daily.  I will follow-up a basic metabolic panel today.  His pressures have been stable, I am not going to make any changes to his medications today, though we could consider discontinuation of amlodipine in favor of BiDil in the future (already on low-dose isosorbide).  We will have to see how his blood pressure does once he initiates hemodialysis, which his daughter says is likely to occur by late April.  2.  Coronary artery disease: Status post prior stenting.  He denies any chest pain.  Aspirin had previously been discontinued in the setting of duodenal angiectasias and GI bleeding however he has since been cleared by GI to resume aspirin and he has been taking.  He remains on statin, beta-blocker, and nitrate therapy.  3.  Essential hypertension: Stable as above.  4.  Hyperlipidemia: He remains on moderate to high intensity Lipitor at 40 mg daily.  5.  Stage IV-V chronic kidney disease: Followed closely by nephrology.  Follow-up basic metabolic panel today.  He has follow-up with vein and vascular surgery to discuss hemodialysis access on the 19th.   6.  Chronic respiratory failure with hypoxia: Stable.  He uses oxygen around-the-clock and has been having intermittent nosebleeds.  I suspect that this is secondary to nonhumidified oxygen and have recommended that they reach out to his oxygen supplier to get humidification.  7.  Acute on chronic GI blood loss: Recent hospitalization secondary to GI bleeding and anemia.  He is now back on aspirin.  CBC stable on February 22.  8.  Disposition: Follow-up in clinic in 4 to 6 weeks.   Murray Hodgkins, NP 08/13/2019, 11:42 AM

## 2019-08-13 NOTE — Patient Instructions (Signed)
Medication Instructions:  Your physician recommends that you continue on your current medications as directed. Please refer to the Current Medication list given to you today.  *If you need a refill on your cardiac medications before your next appointment, please call your pharmacy*   Lab Work: Your physician recommends that you return for lab work today at the medical mall. ([facility])  No appt is needed. Hours are M-F 7AM- 6 PM. If you have labs (blood work) drawn today and your tests are completely normal, you will receive your results only by: Marland Kitchen MyChart Message (if you have MyChart) OR . A paper copy in the mail If you have any lab test that is abnormal or we need to change your treatment, we will call you to review the results.   Testing/Procedures: None ordered    Follow-Up: At [facility], you and your health needs are our priority.  As part of our continuing mission to provide you with exceptional heart care, we have created designated Provider Care Teams.  These Care Teams include your primary Cardiologist (physician) and Advanced Practice Providers (APPs -  Physician Assistants and Nurse Practitioners) who all work together to provide you with the care you need, when you need it.  We recommend signing up for the patient portal called "MyChart".  Sign up information is provided on this After Visit Summary.  MyChart is used to connect with patients for Virtual Visits (Telemedicine).  Patients are able to view lab/test results, encounter notes, upcoming appointments, etc.  Non-urgent messages can be sent to your provider as well.   To learn more about what you can do with MyChart, go to [email or web address].    Your next appointment:   4-6 week(s)  The format for your next appointment:   In Person  Provider:    You may see Nelva Bush, MD or Murray Hodgkins, NP.

## 2019-08-14 ENCOUNTER — Telehealth: Payer: Self-pay

## 2019-08-14 ENCOUNTER — Ambulatory Visit: Payer: Self-pay | Admitting: *Deleted

## 2019-08-14 ENCOUNTER — Telehealth: Payer: Self-pay | Admitting: Family Medicine

## 2019-08-14 NOTE — Telephone Encounter (Signed)
Pt daughter called her father is starting to have nose bleeds. They saw cardiology yesterday they told them that he needed a humidifier. daughter called wanting to get Dr. Ellen Henri opinion on the nose bleeds And would like a call back from [place]

## 2019-08-14 NOTE — Telephone Encounter (Signed)
Patient daughter calling to discuss recent testing results   Please call

## 2019-08-14 NOTE — Telephone Encounter (Signed)
Attempted to call patient. LMTCB 08/14/2019   

## 2019-08-14 NOTE — Telephone Encounter (Signed)
-----   Message from Theora Gianotti, NP sent at 08/13/2019  2:35 PM EST ----- Creatinine better than 2 wks ago (was 5.23, now 3.98).  Remain off of entresto.  Follow-up w/ nephrology and vascular surgery as planned.

## 2019-08-14 NOTE — Telephone Encounter (Signed)
I called and spoke with the patients daughter and she stated that the patient is having nosebleeds 1 week.  This is not continuously just episodes, the patient saw Cardiology yesterday and they stated she needed to buy a humidifier and she will do that, The daughter stated that the patient stated he woke up this morning and his nose was bleeding and it did not stop this time it lasted a while, not gushing blood but just a simple nose bleed, she stated his BP has not been running high at all, please advise.  Also the daughter stated you wanted him to push the covid vaccine out a while because he had just got out of the hospital, she is concerned because she has physical therapy and a home health aid coming into the house and she is concerned about the vaccine how much longer should she wait to get her father vaccinated.   Alex Larson,cma

## 2019-08-14 NOTE — Telephone Encounter (Signed)
Message from Luciana Axe sent at 08/14/2019 6:48 PM EST  Patient's daughter is calling because the patient is having intermittent nose bleeds not bad for 1-2 days. Patient daughter stated that pcp 7 cardiology stated that patient needs a humdifier. Is there something other than leaning back to stop the bleeding.    Call transferred to office number in order to reach access nurse.

## 2019-08-14 NOTE — Telephone Encounter (Signed)
Spoke to daughter to review results.   No further questions or orders at this time.   Advised pt to call for any further questions or concerns.

## 2019-08-14 NOTE — Telephone Encounter (Signed)
I called and spoke with the patient's daughter and informed her that the patient can get vaccinated for covid per Dr. Biagio Quint. Also I informed her that the provider suggested a humidifier and saline nasal spray she stated the patient is not using any nose sprays at all.  I also informed her that she could have a referral to ENT if she feels its needed but if his nose bleeds and he cannot stop it to take the patient to urgent care to evaluate.  She understood.  Jashayla Glatfelter,cma

## 2019-08-14 NOTE — Telephone Encounter (Signed)
She can go ahead an get him vaccinated at this time. I previously discussed delaying it a couple of weeks so he should be ok to get it now. I agree with the need for a humidifier. They could use nasal saline spray as well. Please see if he is currently using any nose sprays. If not improving we could refer to ENT. If he has any high flow nose bleeds or is unable to get it to stop I would suggest going to urgent care for evaluation.

## 2019-08-17 ENCOUNTER — Telehealth: Payer: Self-pay | Admitting: Family Medicine

## 2019-08-17 NOTE — Telephone Encounter (Signed)
Noted. Can you reach out to his daughter and see how he is doing? Is he having any other symptoms? Cough, congestion, fever, chest pain, shortness of breath, vomiting, diarrhea, abdominal pain, bleeding?

## 2019-08-17 NOTE — Telephone Encounter (Signed)
Amy with [facility] called  She stated that pt is not feeling well and his oxygen levels are staying between  87-92% on 3 litters all other vital where within normal range  Pt was very fatigued today when trying to do Therapy

## 2019-08-17 NOTE — Telephone Encounter (Signed)
Nose bleed on Friday patient daughter called Access nurse she stated and received cal back with instruction on steps to stop a nose bleed received the humidification until for patient 02 concentrator  And no new nose bleed sine Friday.  [place] Night - Cl TELEPHONE ADVICE RECORD AccessNurse Patient Name: Alex Larson Gender: Male DOB: 19-Oct-1926 Age: 86 Y 69 D Return Phone Number: [phone] (Primary) Address: City/State/Zip: [place] [address] Client [facility] [place] Night - Cl Client Site [place] Physician [name] - MD Contact Type Call Who Is Calling Patient / Member / Family / Caregiver Call Type Triage / Clinical Caller Name Georgia Lopes Relationship To Patient Daughter Return Phone Number [phone] (Primary) Chief Complaint Nosebleed Reason for Call Symptomatic / Request for [place] states her father is having a nosebleed and states it can be stopped very easily but this morning it took time to subside. Caller states it seems to be one nostril and it was thick and clotty. Caller states she spoke with the doctor this morning but would like help on what else to do. Translation No Nurse Assessment Nurse: Joya Gaskins, RN, Vonna Kotyk Date/Time (Eastern Time): 08/14/2019 7:33:30 PM Confirm and document reason for call. If symptomatic, describe symptoms. ---Caller states her father is having a nosebleed and states it can be stopped very easily but this morning it took time to subside. Caller states it seems to be one nostril and it was thick and clotty. The bleed has mostly stopped now. He uses a nasal cannula. The nosebleed has been intermittent for about a week; they are waiting on humidification to arrive tomorrow. Has the patient had close contact with a person known or suspected to have the novel coronavirus illness OR traveled  / lives in area with major community spread (including international travel) in the last 14 days from the onset of symptoms? * If Asymptomatic, screen for exposure and travel within the last 14 days. ---No Does the patient have any new or worsening symptoms? ---Yes Will a triage be completed? ---Yes Related visit to physician within the last 2 weeks? ---No Does the PT have any chronic conditions? (i.e. diabetes, asthma, this includes High risk factors for pregnancy, etc.) ---Yes List chronic conditions. ---stage 4 renal failure, CHF, renal HTN Is this a behavioral health or substance abuse call? ---NoPLEASE NOTE: All timestamps contained within this report are represented as [place] Standard Time. CONFIDENTIALTY NOTICE: This fax transmission is intended only for the addressee. It contains information that is legally privileged, confidential or otherwise protected from use or disclosure. If you are not the intended recipient, you are strictly prohibited from reviewing, disclosing, copying using or disseminating any of this information or taking any action in reliance on or regarding this information. If you have received this fax in error, please notify us immediately by telephone so that we can arrange for its return to [place]. Phone: [phone], Toll-Free: [phone], Fax: [phone] Page: 2 of 2 Call Id: [id number] Guidelines Guideline Title Affirmed Question Affirmed Notes Nurse Date/Time Eilene Ghazi Time) Nosebleed [1] Mild-moderate nosebleed AND [2] bleeding stopped now Rachel Moulds 08/14/2019 7:36:37 PM Disp. Time Eilene Ghazi Time) Disposition Final User 08/14/2019 7:14:10 PM Attempt made - message left Rachel Moulds 08/14/2019 7:44:07 PM [place], RN, Aviva Kluver Disagree/Comply Comply Caller Understands Yes PreDisposition Did not know what to do Care Advice Given Per Guideline HOME CARE: * You should be able to  treat this at home. * First gently blow the nose  to clear out any large clots. [facility] THE NOSE: Gently squeeze the soft parts of the lower nose (nostrils) together. Use your thumb and your index finger in a pinching manner. Do this for 15 minutes. Use a clock or watch to measure the time. Goal: apply constant pressure to the bleeding point. * LEAN FORWARD: Sit down and lean forward. Reason: blood makes people choke if they lean backwards. CALL BACK IF: * Nosebleeding lasts longer than 30 minutes with using direct pressure * Lightheadedness or weakness occurs * Nosebleeds become worse * You become worse. CARE ADVICE given per Nosebleed (Adult) guideline

## 2019-08-17 NOTE — Telephone Encounter (Signed)
Amy with [facility] called, she stated the patient did not feel well today, his oxygen level are staying between 87-92% on 3 liters, all other vitals where within normal range. Pt was very fatigued today when trying to do therapy.  Rayonna Heldman,cma

## 2019-08-18 NOTE — Telephone Encounter (Signed)
I called and checked on the patient and she stated that she purchased the humidifier and she got the kind that went into his concentrator on [employer] [employer] and he has not had any nosebleeds since Friday night and he stated he felt so much better, he is not having any issues at all.  Teyanna Thielman,cma

## 2019-08-18 NOTE — Telephone Encounter (Signed)
Noted  

## 2019-08-19 ENCOUNTER — Other Ambulatory Visit (INDEPENDENT_AMBULATORY_CARE_PROVIDER_SITE_OTHER): Payer: Self-pay | Admitting: Vascular Surgery

## 2019-08-19 DIAGNOSIS — N186 End stage renal disease: Secondary | ICD-10-CM

## 2019-08-21 ENCOUNTER — Ambulatory Visit (INDEPENDENT_AMBULATORY_CARE_PROVIDER_SITE_OTHER): Payer: Medicare PPO

## 2019-08-21 ENCOUNTER — Other Ambulatory Visit: Payer: Self-pay

## 2019-08-21 ENCOUNTER — Encounter (INDEPENDENT_AMBULATORY_CARE_PROVIDER_SITE_OTHER): Payer: Self-pay | Admitting: Vascular Surgery

## 2019-08-21 ENCOUNTER — Ambulatory Visit (INDEPENDENT_AMBULATORY_CARE_PROVIDER_SITE_OTHER): Payer: Medicare PPO | Admitting: Vascular Surgery

## 2019-08-21 VITALS — BP 126/55 | HR 56 | Resp 16 | Ht 74.0 in | Wt 161.0 lb

## 2019-08-21 DIAGNOSIS — N185 Chronic kidney disease, stage 5: Secondary | ICD-10-CM

## 2019-08-21 DIAGNOSIS — I10 Essential (primary) hypertension: Secondary | ICD-10-CM

## 2019-08-21 DIAGNOSIS — I739 Peripheral vascular disease, unspecified: Secondary | ICD-10-CM

## 2019-08-21 DIAGNOSIS — N186 End stage renal disease: Secondary | ICD-10-CM

## 2019-08-21 DIAGNOSIS — I5042 Chronic combined systolic (congestive) and diastolic (congestive) heart failure: Secondary | ICD-10-CM

## 2019-08-21 NOTE — Assessment & Plan Note (Signed)
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  May preclude [place] from considering lower extremity access as well

## 2019-08-21 NOTE — Assessment & Plan Note (Signed)
Ejection fraction markedly reduced.  Is going to be at relatively high risk for any surgical procedure requiring general anesthesia

## 2019-08-21 NOTE — Progress Notes (Addendum)
Patient ID: Alex Larson, male   DOB: 1926-12-01, 86 y.o.   MRN: [id number]  Chief Complaint  Patient presents with  . New Patient (Initial Visit)    ref  [place] vein mapping    HPI Alex Larson is a 86 y.o. male.  I am asked to see the patient by Dr. Juleen China for evaluation of permanent dialysis access.  The patient has severe chronic kidney disease that is approaching dialysis dependence but he has not yet started dialysis.  His daughter thinks his last GFR was 10.  The last GFR I see in the system was 14.  Either way this would be stage V chronic kidney disease.  He is right-hand dominant.  He has no specific complaints today.  He has multiple other comorbidities including an ejection fraction of 10% and oxygen dependence.  He is studied with noninvasive studies today to assess his access options.  His superficial veins are relatively small and inadequate or marginal for fistula creation with the exception of the upper arm basilic vein on the right which may be adequate for fistula creation.  A more problematic finding on his noninvasive studies would be that both radial and ulnar arteries are occluded in each arm.  The brachial arteries are triphasic on the right and biphasic on the left, and it appears that the interosseous artery is the only thing feeding the hand distally.     Past Medical History:  Diagnosis Date  . CAD (coronary artery disease)   . CKD (chronic kidney disease), stage IV ([place])   . HFrEF (heart failure with reduced ejection fraction) ([place])    a. 10/2018 Echo: EF 10-15%, DD. Diff HK. RVSP 70.4 mmHg. Mildly dil LA. Mod dil RA. Mod-sev TR. Ao sclerosis w/o stenosis.  . Hypertension   . Ischemic cardiomyopathy   . PAD (peripheral artery disease) ([name])    a. s/p RLE bypass.    Past Surgical History:  Procedure Laterality Date  . BYPASS GRAFT     Right lower extremity  . COLONOSCOPY WITH PROPOFOL N/A 07/18/2019   Procedure: COLONOSCOPY WITH PROPOFOL;  Surgeon:  Lin Landsman, MD;  Location: [facility] ENDOSCOPY;  Service: Gastroenterology;  Laterality: N/A;  . CORONARY STENT INTERVENTION     x2 in [place].  Details unknown.  . ESOPHAGOGASTRODUODENOSCOPY (EGD) WITH PROPOFOL N/A 07/18/2019   Procedure: ESOPHAGOGASTRODUODENOSCOPY (EGD) WITH PROPOFOL;  Surgeon: Lin Landsman, MD;  Location: [facility];  Service: Gastroenterology;  Laterality: N/A;  . REPLACEMENT TOTAL KNEE BILATERAL       Family History  Problem Relation Age of Onset  . Breast cancer Mother   No bleeding or clotting disorders No aneurysms  Social History   Tobacco Use  . Smoking status: Former Research scientist (life sciences)  . Smokeless tobacco: Former Systems developer  . Tobacco comment: quit age 16, smoked from teenage years  Substance Use Topics  . Alcohol use: Not Currently  . Drug use: Never     No Known Allergies  Current Outpatient Medications  Medication Sig Dispense Refill  . amLODipine (NORVASC) 5 MG tablet Take 1 tablet (5 mg total) by mouth daily. 90 tablet 3  . aspirin EC 81 MG tablet Take 1 tablet (81 mg total) by mouth daily. 90 tablet 3  . carvedilol (COREG) 3.125 MG tablet Take 1 tablet (3.125 mg total) by mouth 2 (two) times daily with a meal. 60 tablet 5  . ferrous sulfate 220 (44 Fe) MG/5ML solution Take 220 mg by mouth daily.    Marland Kitchen  furosemide (LASIX) 80 MG tablet Take 1 tablet (80 mg total) by mouth daily. 30 tablet 0  . isosorbide mononitrate (ISMO) 10 MG tablet Take 1 tablet by mouth once daily 90 tablet 3  . levothyroxine (SYNTHROID) 50 MCG tablet Take 1 tablet (50 mcg total) by mouth daily at 6 (six) AM. 30 tablet 0  . multivitamin-iron-minerals-folic acid (CENTRUM) chewable tablet Chew 1 tablet by mouth daily.    . Nutritional Supplements (FEEDING SUPPLEMENT, NEPRO CARB STEADY,) LIQD Take 237 mLs by mouth 2 (two) times daily between meals. 237 mL 0  . Nutritional Supplements (NEPRO) LIQD Take 237 mLs by mouth 2 (two) times daily between meals., 1000 mL 6  . pantoprazole  (PROTONIX) 40 MG tablet Take 1 tablet (40 mg total) by mouth daily. 90 tablet 1  . atorvastatin (LIPITOR) 40 MG tablet Take 1 tablet (40 mg total) by mouth daily. 90 tablet 3   No current facility-administered medications for this visit.      REVIEW OF SYSTEMS (Negative unless checked)  Constitutional: [] Weight loss  [] Fever  [] Chills Cardiac: [] Chest pain   [] Chest pressure   [] Palpitations   [] Shortness of breath when laying flat   [] Shortness of breath at rest   [x] Shortness of breath with exertion. Vascular:  [] Pain in legs with walking   [] Pain in legs at rest   [] Pain in legs when laying flat   [] Claudication   [] Pain in feet when walking  [] Pain in feet at rest  [] Pain in feet when laying flat   [] History of DVT   [] Phlebitis   [] Swelling in legs   [] Varicose veins   [] Non-healing ulcers Pulmonary:   [x] Uses home oxygen   [] Productive cough   [] Hemoptysis   [] Wheeze  [x] COPD   [] Asthma Neurologic:  [] Dizziness  [] Blackouts   [] Seizures   [] History of stroke   [] History of TIA  [] Aphasia   [] Temporary blindness   [] Dysphagia   [] Weakness or numbness in arms   [] Weakness or numbness in legs Musculoskeletal:  [x] Arthritis   [] Joint swelling   [x] Joint pain   [] Low back pain Hematologic:  [] Easy bruising  [] Easy bleeding   [] Hypercoagulable state   [x] Anemic  [] Hepatitis Gastrointestinal:  [x] Blood in stool   [] Vomiting blood  [] Gastroesophageal reflux/heartburn   [] Abdominal pain Genitourinary:  [x] Chronic kidney disease   [] Difficult urination  [] Frequent urination  [] Burning with urination   [] Hematuria Skin:  [] Rashes   [] Ulcers   [] Wounds Psychological:  [] History of anxiety   []  History of major depression.    Physical Exam BP (!) 126/55 (BP Location: Right Arm)   Pulse (!) 56   Resp 16   Ht 6\' 2"  (1.88 m)   Wt 161 lb (73 kg)   BMI 20.67 kg/m  Gen:  WD/WN, NAD.  Appears younger than stated age Head: [place]/AT, + temporalis wasting.  Ear/Nose/Throat: Hearing grossly intact,  nares w/o erythema or drainage, oropharynx w/o Erythema/Exudate Eyes: Conjunctiva clear, sclera non-icteric  Neck: trachea midline.  No JVD.  Pulmonary:  Good air movement, respirations not labored, no use of accessory muscles on supplemental oxygen Cardiac: Irregular Vascular:  Vessel Right Left  Radial  not palpable  not palpable                                   Gastrointestinal:. No masses, surgical incisions, or scars. Musculoskeletal: M/S 5/5 throughout.  Extremities without ischemic changes.  No deformity or  atrophy. Mild BLE edema. Neurologic: Sensation grossly intact in extremities.  Symmetrical.  Speech is fluent. Motor exam as listed above. Psychiatric: Judgment intact, Mood & affect appropriate for pt's clinical situation. Dermatologic: No rashes or ulcers noted.  No cellulitis or open wounds.    Radiology No results found.  Labs Recent Results (from the past 2160 hour(s))  Hepatic function panel     Status: Abnormal   Collection Time: 07/08/19 11:47 AM  Result Value Ref Range   Total Protein 6.7 6.5 - 8.1 g/dL   Albumin 3.4 (L) 3.5 - 5.0 g/dL   AST 27 15 - 41 U/L   ALT 12 0 - 44 U/L   Alkaline Phosphatase 47 38 - 126 U/L   Total Bilirubin 0.9 0.3 - 1.2 mg/dL   Bilirubin, Direct <0.1 0.0 - 0.2 mg/dL   Indirect Bilirubin NOT CALCULATED 0.3 - 0.9 mg/dL    Comment: Performed at [facility], [address] [place], [place] [address]  Lipid Profile     Status: Abnormal   Collection Time: 07/08/19 11:47 AM  Result Value Ref Range   Cholesterol 187 0 - 200 mg/dL   Triglycerides 134 <150 mg/dL   HDL 41 >40 mg/dL   Total CHOL/HDL Ratio 4.6 RATIO   VLDL 27 0 - 40 mg/dL   LDL Cholesterol 119 (H) 0 - 99 mg/dL    Comment:        Total Cholesterol/HDL:CHD Risk Coronary Heart Disease Risk Table                     Men   Women  1/2 Average Risk   3.4   3.3  Average Risk       5.0   4.4  2 X Average Risk   9.6   7.1  3 X Average Risk  23.4   11.0         Use the calculated Patient Ratio above and the CHD Risk Table to determine the patient's CHD Risk.        ATP III CLASSIFICATION (LDL):  <100     mg/dL   Optimal  100-129  mg/dL   Near or Above                    Optimal  130-159  mg/dL   Borderline  160-189  mg/dL   High  >190     mg/dL   Very High Performed at [facility], [address] [place], [place] [address]   CBC     Status: Abnormal   Collection Time: 07/16/19 11:07 PM  Result Value Ref Range   WBC 4.8 4.0 - 10.5 K/uL   RBC 2.07 (L) 4.22 - 5.81 MIL/uL   Hemoglobin 6.4 (L) 13.0 - 17.0 g/dL   HCT 21.0 (L) 39.0 - 52.0 %   MCV 101.4 (H) 80.0 - 100.0 fL   MCH 30.9 26.0 - 34.0 pg   MCHC 30.5 30.0 - 36.0 g/dL   RDW 21.8 (H) 11.5 - 15.5 %   Platelets 128 (L) 150 - 400 K/uL    Comment: PLATELET COUNT CONFIRMED BY SMEAR Immature Platelet Fraction may be clinically indicated, consider ordering this additional test [id number]    nRBC 0.6 (H) 0.0 - 0.2 %    Comment: Performed at [facility], [address] [place], [place] [address]  Comprehensive metabolic panel     Status: Abnormal   Collection Time: 07/16/19 11:07 PM  Result  Value Ref Range   Sodium 140 135 - 145 mmol/L   Potassium 4.7 3.5 - 5.1 mmol/L   Chloride 108 98 - 111 mmol/L   CO2 18 (L) 22 - 32 mmol/L   Glucose, Bld 134 (H) 70 - 99 mg/dL   BUN 87 (H) 8 - 23 mg/dL   Creatinine, Ser 4.68 (H) 0.61 - 1.24 mg/dL   Calcium 10.3 8.9 - 10.3 mg/dL   Total Protein 7.4 6.5 - 8.1 g/dL   Albumin 3.6 3.5 - 5.0 g/dL   AST 45 (H) 15 - 41 U/L   ALT 22 0 - 44 U/L   Alkaline Phosphatase 67 38 - 126 U/L   Total Bilirubin 0.9 0.3 - 1.2 mg/dL   GFR calc non Af Amer 10 (L) >60 mL/min   GFR calc Af Amer 12 (L) >60 mL/min   Anion gap 14 5 - 15    Comment: Performed at [facility], [address], [place], [place] [address]  Troponin I (High Sensitivity)     Status: Abnormal   Collection Time: 07/16/19 11:07 PM  Result Value Ref Range    Troponin I (High Sensitivity) 53 (H) <18 ng/L    Comment: (NOTE) Elevated high sensitivity troponin I (hsTnI) values and significant  changes across serial measurements may suggest ACS but many other  chronic and acute conditions are known to elevate hsTnI results.  Refer to the "Links" section for chest pain algorithms and additional  guidance. Performed at [facility], [address] [place], [place] [address]   Brain natriuretic peptide     Status: Abnormal   Collection Time: 07/16/19 11:07 PM  Result Value Ref Range   B Natriuretic Peptide 3,427.0 (H) 0.0 - 100.0 pg/mL    Comment: Performed at [facility], [address] [place], [place] [address]  Respiratory Panel by RT PCR (Flu A&B, Covid) - Nasopharyngeal Swab     Status: None   Collection Time: 07/16/19 11:44 PM   Specimen: Nasopharyngeal Swab  Result Value Ref Range   SARS Coronavirus 2 by RT PCR NEGATIVE NEGATIVE    Comment: (NOTE) SARS-CoV-2 target nucleic acids are NOT DETECTED. The SARS-CoV-2 RNA is generally detectable in upper respiratoy specimens during the acute phase of infection. The lowest concentration of SARS-CoV-2 viral copies this assay can detect is 131 copies/mL. A negative result does not preclude SARS-Cov-2 infection and should not be used as the sole basis for treatment or other patient management decisions. A negative result may occur with  improper specimen collection/handling, submission of specimen other than nasopharyngeal swab, presence of viral mutation(s) within the areas targeted by this assay, and inadequate number of viral copies (<131 copies/mL). A negative result must be combined with clinical observations, patient history, and epidemiological information. The expected result is Negative. Fact Sheet for Patients:  PinkCheek.be Fact Sheet for Healthcare Providers:  GravelBags.it This test is not yet ap  proved or cleared by the [place] FDA and  has been authorized for detection and/or diagnosis of SARS-CoV-2 by FDA under an Emergency Use Authorization (EUA). This EUA will remain  in effect (meaning this test can be used) for the duration of the COVID-19 declaration under Section 564(b)(1) of the Act, 21 U.S.C. section 360bbb-3(b)(1), unless the authorization is terminated or revoked sooner.    Influenza A by PCR NEGATIVE NEGATIVE   Influenza B by PCR NEGATIVE NEGATIVE    Comment: (NOTE) The Xpert Xpress SARS-CoV-2/FLU/RSV assay is intended as an aid in  the diagnosis of influenza from Nasopharyngeal swab specimens and  should not be used as a sole basis for treatment. Nasal washings and  aspirates are unacceptable for Xpert Xpress SARS-CoV-2/FLU/RSV  testing. Fact Sheet for Patients: PinkCheek.be Fact Sheet for Healthcare Providers: GravelBags.it This test is not yet approved or cleared by the [place] FDA and  has been authorized for detection and/or diagnosis of SARS-CoV-2 by  FDA under an Emergency Use Authorization (EUA). This EUA will remain  in effect (meaning this test can be used) for the duration of the  Covid-19 declaration under Section 564(b)(1) of the Act, 21  U.S.C. section 360bbb-3(b)(1), unless the authorization is  terminated or revoked. Performed at [facility], [address] [place], [place] [address]   Prepare RBC     Status: None   Collection Time: 07/17/19 12:28 AM  Result Value Ref Range   Order Confirmation      ORDER PROCESSED BY BLOOD BANK Performed at [facility], [address] [place], [place] [address]   Type and screen [place]     Status: None   Collection Time: 07/17/19 12:28 AM  Result Value Ref Range   ABO/RH(D) B NEG    Antibody Screen NEG    Sample Expiration 07/20/2019,2359    Unit Number [id number]    Blood  Component Type RED CELLS,LR    Unit division 00    Status of Unit ISSUED,FINAL    Transfusion Status OK TO TRANSFUSE    Crossmatch Result Compatible    Unit Number [id number]    Blood Component Type RED CELLS,LR    Unit division 00    Status of Unit ISSUED,FINAL    Transfusion Status OK TO TRANSFUSE    Crossmatch Result      Compatible Performed at [facility], [address] [place], [place] [address]   BPAM RBC     Status: None   Collection Time: 07/17/19 12:28 AM  Result Value Ref Range   ISSUE DATE / TIME 889169450388    Blood Product Unit Number [id number]    PRODUCT CODE [id number]    Unit Type and Rh 9500    Blood Product Expiration Date 016553748270    ISSUE DATE / TIME 786754492010    Blood Product Unit Number [id number]    PRODUCT CODE [id number]    Unit Type and Rh 1700    Blood Product Expiration Date 498264158309   Protime-INR     Status: Abnormal   Collection Time: 07/17/19 12:29 AM  Result Value Ref Range   Prothrombin Time 17.6 (H) 11.4 - 15.2 seconds   INR 1.5 (H) 0.8 - 1.2    Comment: (NOTE) INR goal varies based on device and disease states. Performed at [facility], [address] [place], [place] [address]   APTT     Status: Abnormal   Collection Time: 07/17/19 12:29 AM  Result Value Ref Range   aPTT 38 (H) 24 - 36 seconds    Comment:        IF BASELINE aPTT IS ELEVATED, SUGGEST PATIENT RISK ASSESSMENT BE USED TO DETERMINE APPROPRIATE ANTICOAGULANT THERAPY. Performed at [facility], [address] [place], [place] [address]   Magnesium     Status: None   Collection Time: 07/17/19 12:29 AM  Result Value Ref Range   Magnesium 2.1 1.7 - 2.4 mg/dL    Comment: Performed at [facility], [address] [place], [place] [address]  Troponin I (  High Sensitivity)     Status: Abnormal   Collection Time: 07/17/19 12:29 AM  Result Value Ref Range   Troponin I (High Sensitivity) 47 (H) <18 ng/L     Comment: (NOTE) Elevated high sensitivity troponin I (hsTnI) values and significant  changes across serial measurements may suggest ACS but many other  chronic and acute conditions are known to elevate hsTnI results.  Refer to the "Links" section for chest pain algorithms and additional  guidance. Performed at [facility], [address] [place], [place] [address]   ABO/Rh     Status: None   Collection Time: 07/17/19  2:19 AM  Result Value Ref Range   ABO/RH(D)      B NEG Performed at [facility], [address] [place], [place] [address]   Basic metabolic panel     Status: Abnormal   Collection Time: 07/17/19  2:19 AM  Result Value Ref Range   Sodium 139 135 - 145 mmol/L   Potassium 4.5 3.5 - 5.1 mmol/L   Chloride 108 98 - 111 mmol/L   CO2 24 22 - 32 mmol/L   Glucose, Bld 125 (H) 70 - 99 mg/dL   BUN 79 (H) 8 - 23 mg/dL   Creatinine, Ser 4.72 (H) 0.61 - 1.24 mg/dL   Calcium 10.2 8.9 - 10.3 mg/dL   GFR calc non Af Amer 10 (L) >60 mL/min   GFR calc Af Amer 12 (L) >60 mL/min   Anion gap 7 5 - 15    Comment: Performed at [facility], [address] [place], [place] [address]  CBC     Status: Abnormal   Collection Time: 07/17/19  9:12 AM  Result Value Ref Range   WBC 4.4 4.0 - 10.5 K/uL   RBC 2.35 (L) 4.22 - 5.81 MIL/uL   Hemoglobin 7.4 (L) 13.0 - 17.0 g/dL   HCT 22.6 (L) 39.0 - 52.0 %   MCV 96.2 80.0 - 100.0 fL   MCH 31.5 26.0 - 34.0 pg   MCHC 32.7 30.0 - 36.0 g/dL   RDW 20.0 (H) 11.5 - 15.5 %   Platelets 189 150 - 400 K/uL   nRBC 0.0 0.0 - 0.2 %    Comment: Performed at [facility], [address] [place], [place] [address]  TSH     Status: Abnormal   Collection Time: 07/17/19  9:12 AM  Result Value Ref Range   TSH 203.000 (H) 0.350 - 4.500 uIU/mL    Comment: Performed by a 3rd Generation assay with a functional sensitivity of <=0.01 uIU/mL. Performed at [facility], [address] [place], [place] [address]     Hemoglobin and hematocrit, blood     Status: Abnormal   Collection Time: 07/17/19  1:23 PM  Result Value Ref Range   Hemoglobin 7.2 (L) 13.0 - 17.0 g/dL   HCT 22.4 (L) 39.0 - 52.0 %    Comment: Performed at [facility], [address] [place], [place] [address]  Hemoglobin and hematocrit, blood     Status: Abnormal   Collection Time: 07/17/19  7:26 PM  Result Value Ref Range   Hemoglobin 7.4 (L) 13.0 - 17.0 g/dL   HCT 22.9 (L) 39.0 - 52.0 %    Comment: Performed at [facility], [address] [place], [place] [address]  Hemoglobin and hematocrit, blood     Status: Abnormal   Collection Time: 07/18/19  2:21 AM  Result Value Ref Range  Hemoglobin 7.6 (L) 13.0 - 17.0 g/dL   HCT 23.7 (L) 39.0 - 52.0 %    Comment: Performed at [facility], [address] [place], [place] [address]  Prepare RBC     Status: None   Collection Time: 07/18/19  8:16 AM  Result Value Ref Range   Order Confirmation      ORDER PROCESSED BY BLOOD BANK Performed at [facility], [address] [place], [place] [address]   Vitamin B12     Status: Abnormal   Collection Time: 07/18/19  1:58 PM  Result Value Ref Range   Vitamin B-12 1,442 (H) 180 - 914 pg/mL    Comment: (NOTE) This assay is not validated for testing neonatal or myeloproliferative syndrome specimens for Vitamin B12 levels. Performed at [facility], [address], [place], [place] [address]   Folate     Status: None   Collection Time: 07/18/19  1:58 PM  Result Value Ref Range   Folate 26.0 >5.9 ng/mL    Comment: Performed at [facility], [address] [place], [place] [address]  Ferritin     Status: None   Collection Time: 07/18/19  1:58 PM  Result Value Ref Range   Ferritin 120 24 - 336 ng/mL    Comment: Performed at [facility], [address] [place], [place] [address]  Iron and TIBC     Status: Abnormal   Collection Time: 07/18/19  1:58 PM  Result Value  Ref Range   Iron 20 (L) 45 - 182 ug/dL   TIBC 249 (L) 250 - 450 ug/dL   Saturation Ratios 8 (L) 17.9 - 39.5 %   UIBC 229 ug/dL    Comment: Performed at [facility], [address] [place], [place] [address]  Glucose, capillary     Status: None   Collection Time: 07/18/19  9:14 PM  Result Value Ref Range   Glucose-Capillary 74 70 - 99 mg/dL  Hemoglobin     Status: Abnormal   Collection Time: 07/19/19  5:15 AM  Result Value Ref Range   Hemoglobin 8.6 (L) 13.0 - 17.0 g/dL    Comment: Performed at [facility], [address] [place], [place] [address]  Basic metabolic panel     Status: Abnormal   Collection Time: 07/19/19  5:15 AM  Result Value Ref Range   Sodium 144 135 - 145 mmol/L   Potassium 3.8 3.5 - 5.1 mmol/L   Chloride 113 (H) 98 - 111 mmol/L   CO2 22 22 - 32 mmol/L   Glucose, Bld 68 (L) 70 - 99 mg/dL   BUN 74 (H) 8 - 23 mg/dL   Creatinine, Ser 4.67 (H) 0.61 - 1.24 mg/dL   Calcium 9.9 8.9 - 10.3 mg/dL   GFR calc non Af Amer 10 (L) >60 mL/min   GFR calc Af Amer 12 (L) >60 mL/min   Anion gap 9 5 - 15    Comment: Performed at [facility], [address] [place],  [address]  T4, free     Status: Abnormal   Collection Time: 07/19/19  5:15 AM  Result Value Ref Range   Free T4 <0.25 (L) 0.61 - 1.12 ng/dL    Comment: (NOTE) Biotin ingestion may interfere with free T4 tests. If the results are inconsistent with the TSH level, previous test results, or the clinical presentation, then consider biotin interference. If needed, order repeat testing after stopping biotin. Performed at [facility], [place], [place] [address]   CBC     Status: Abnormal   Collection Time: 07/27/19 11:10 AM  Result Value Ref Range   WBC 5.1 3.4 - 10.8 x10E3/uL   RBC 3.50 (L) 4.14 - 5.80 x10E6/uL   Hemoglobin 10.6 (L) 13.0 - 17.7 g/dL   Hematocrit 31.8 (L) 37.5 - 51.0 %   MCV 91 79 - 97 fL   MCH 30.3 26.6 - 33.0 pg   MCHC 33.3  31.5 - 35.7 g/dL   RDW 16.8 (H) 11.6 - 15.4 %   Platelets 218 150 - 450 O03T5/HR  Basic metabolic panel     Status: Abnormal   Collection Time: 07/27/19 11:10 AM  Result Value Ref Range   Glucose 89 65 - 99 mg/dL   BUN 62 (H) 10 - 36 mg/dL   Creatinine, Ser 5.23 (H) 0.76 - 1.27 mg/dL   GFR calc non Af Amer 9 (L) >59 mL/min/1.73   GFR calc Af Amer 10 (L) >59 mL/min/1.73   BUN/Creatinine Ratio 12 10 - 24   Sodium 142 134 - 144 mmol/L   Potassium 4.3 3.5 - 5.2 mmol/L   Chloride 105 96 - 106 mmol/L   CO2 20 20 - 29 mmol/L   Calcium 10.1 8.6 - 10.2 mg/dL  Basic metabolic panel     Status: Abnormal   Collection Time: 08/13/19 12:32 PM  Result Value Ref Range   Sodium 137 135 - 145 mmol/L   Potassium 4.7 3.5 - 5.1 mmol/L   Chloride 105 98 - 111 mmol/L   CO2 21 (L) 22 - 32 mmol/L   Glucose, Bld 97 70 - 99 mg/dL    Comment: Glucose reference range applies only to samples taken after fasting for at least 8 hours.   BUN 88 (H) 8 - 23 mg/dL   Creatinine, Ser 3.98 (H) 0.61 - 1.24 mg/dL   Calcium 10.6 (H) 8.9 - 10.3 mg/dL   GFR calc non Af Amer 12 (L) >60 mL/min   GFR calc Af Amer 14 (L) >60 mL/min   Anion gap 11 5 - 15    Comment: Performed at [facility], [address], [place], [place] [address]    Assessment/Plan:  Essential hypertension Likely an underlying cause of his renal failure and blood pressure control important in reducing the progression of atherosclerotic disease. On appropriate oral medications.   Peripheral vascular disease, unspecified ([place]) Has already had a leg bypass on the right.  May preclude [place] from considering lower extremity access as well  Chronic combined systolic and diastolic heart failure ([name]) Ejection fraction markedly reduced.  Is going to be at relatively high risk for any surgical procedure requiring general anesthesia  Chronic kidney disease (CKD), stage V ([name]) His superficial veins are relatively small and inadequate or marginal  for fistula creation with the exception of the upper arm basilic vein on the right which may be adequate for fistula creation.  A more problematic finding on his noninvasive studies would be that both radial and ulnar arteries are occluded in each arm.  The brachial arteries are triphasic on the right and biphasic on the left, and it appears that the interosseous artery is the only thing feeding the hand distally.   Given the fact that he does not need dialysis imminently, I would not recommend placing a graft.  The idea of doing a two-stage operation in a 86 year old with an ejection fraction of 10 to 15% requiring 2 anesthetics  is not a great thought for a brachiobasilic fistula.  The matter what access we put in, his risk of developing significant steal syndrome in that extremity is very high as he has no runoff in the radial or ulnar arteries.  He has PAD and is already had a bypass in the right leg.  All of these would add up to him being extremely limited and high risk for dialysis access placement.  Quite honestly, his safest course of action would be catheter dependent dialysis if and when he starts dialysis. I will discuss with his nephrologist, but would not plan any procedures at this time.      Leotis Pain 08/21/2019, 10:48 AM   This note was created with Dragon medical transcription system.  Any errors from dictation are unintentional.     Addendum. After discussions with his nephrologist, he feels that he needs to start dialysis imminently.  As such, we will get a PermCath in him next week at their request.  I still do not think extremity access is a good option going forward, and he would have to either then consider peritoneal dialysis catheter placement or chronic PermCath dialysis.

## 2019-08-21 NOTE — Assessment & Plan Note (Signed)
His superficial veins are relatively small and inadequate or marginal for fistula creation with the exception of the upper arm basilic vein on the right which may be adequate for fistula creation.  A more problematic finding on his noninvasive studies would be that both radial and ulnar arteries are occluded in each arm.  The brachial arteries are triphasic on the right and biphasic on the left, and it appears that the interosseous artery is the only thing feeding the hand distally.   Given the fact that he does not need dialysis imminently, I would not recommend placing a graft.  The idea of doing a two-stage operation in a 86 year old with an ejection fraction of 10 to 15% requiring 2 anesthetics is not a great thought for a brachiobasilic fistula.  The matter what access we put in, his risk of developing significant steal syndrome in that extremity is very high as he has no runoff in the radial or ulnar arteries.  He has PAD and is already had a bypass in the right leg.  All of these would add up to him being extremely limited and high risk for dialysis access placement.  Quite honestly, his safest course of action would be catheter dependent dialysis if and when he starts dialysis. I will discuss with his nephrologist, but would not plan any procedures at this time.

## 2019-08-21 NOTE — Assessment & Plan Note (Signed)
Likely an underlying cause of his renal failure and blood pressure control important in reducing the progression of atherosclerotic disease. On appropriate oral medications.  

## 2019-08-21 NOTE — Patient Instructions (Signed)
Vascular Access for Hemodialysis        A vascular access is a connection to the blood inside your blood vessels that allows blood to be easily removed from your body and returned to your body during kidney dialysis (hemodialysis). Hemodialysis is a procedure in which a machine outside of the body filters the blood of a person whose kidneys are no longer working properly. There are three types of vascular accesses:  Arteriovenous fistula (AVF). This is a connection between an artery and a vein (usually in the arm) that is made by sewing them together. Blood in the artery flows directly into the vein, causing it to get larger over time. This makes it easier for the vein to be used for hemodialysis. An arteriovenous fistula takes 1-6 months to develop after surgery.  Arteriovenous graft (AVG). This is a connection between an artery and a vein in the arm that is made with a tube. An arteriovenous graft can be used within 2-3 weeks of surgery.  Venous catheter. This is a thin, flexible tube that is placed in a large vein (usually in the neck, chest, or groin). A venous catheter for hemodialysis contains two tubes that come out of the skin. A venous catheter can be used right away. It is usually used as a temporary access if you need hemodialysis before a fistula or graft has developed, or if kidney failure is sudden (acute) and likely to improve without the need for long-term dialysis. It may also be used as a permanent access if a fistula or graft cannot be created. Which type of access is best for me? The type of access that is best for you depends on the size and strength of your veins, your age, and any other health problems that you have, such as diabetes. An ultrasound test may be used to look at your veins to help make this decision. A fistula is usually the preferred type of access. It can last several years and is less likely than the other types of accesses to become infected or to cause a blood  clot within a blood vessel (thrombosis). However, a fistula is not an option for everyone. If your veins are not the right size or if the fistula does not develop properly, a graft may be used instead. Grafts require you to have strong veins. If your veins are not strong enough for a graft, a catheter may be used. Catheters are more likely than fistulas and grafts to become infected or to have a thrombosis. Sometimes, only one type of access is an option. Your health care provider will help you determine which type of access is best for you. How is a vascular access used? The way that the access is used depends on the type of access:  If the access is a fistula or graft, two needles are inserted through the skin into the access before each hemodialysis session. Blood leaves the body through one of the needles and travels through a tube to the hemodialysis machine (dialyzer). Then it flows through another tube and returns to the body through the second needle.  If the access is a catheter, one tube is connected directly to the tube that leads to the dialyzer, and the other tube is connected to a tube that leads away from the dialyzer. Blood leaves the body through one tube and returns to the body through the other. What problems can occur with vascular access?  A blood clot within a blood vessel (thrombosis).  Thrombosis can lead to a narrowing of a blood vessel (stenosis). If thrombosis occurs frequently, another access site may be created as a backup.  Infection.  Heart enlargement (cardiomegaly) and heart failure. Changes in blood flow may cause an increase in blood pressure or heart rate, making your heart work harder to pump blood. These problems are most likely to occur with a venous catheter and least likely to occur with an arteriovenous fistula. How do I care for my vascular access?  Wear a medical alert bracelet. In case of an emergency, this bracelet tells health care providers that you  are a dialysis patient and allows them to care for your veins appropriately. If you have a graft or fistula:  A "bruit" is a noise that is heard with a stethoscope, and a "thrill" is a vibration that is felt over the graft or fistula. The presence of the bruit and thrill indicates that the access is working. You will be taught to feel for the thrill each day. If this is not felt, the access may be clotted. Call your health care provider.  Keep your arm straight and raised (elevated) above your heart while the access site is healing.  You may freely use the arm where your vascular access is located after the site heals. Keep the following in mind: ? Avoid pressure on the arm. ? Avoid lifting heavy objects with the arm. ? Avoid sleeping on the arm. ? Avoid wearing tight-sleeved shirts or jewelry around the graft or fistula.  Do not allow blood pressure monitoring or needle punctures on the side where the graft or fistula is located.  With permission from your health care provider, you may do exercises to help with blood flow through a fistula. These exercises involve squeezing a rubber ball or other soft objects as instructed.  Wash your access site according to directions from your health care provider. If you have a venous catheter:  Keep the insertion site clean and dry at all times.  If you are told you can shower after the site heals, use a protective covering over the catheter to keep it dry.  Follow directions from your health care provider for bandage (dressing) changes.  Ask your health care provider what activities are safe for you. You may be restricted from lifting or making repetitive arm movements on the side with the catheter. Contact a health care provider if:  Swelling around the graft or fistula gets worse.  You develop new pain.  Your catheter gets damaged. Get help right away if:  You have pain, numbness, an unusual pale skin color, or blue fingers or sores at  the tips of your fingers in the hand on the side of your fistula.  You have chills.  You have a fever.  You have pus or other fluid (drainage) at the vascular access site.  You develop skin redness or red streaking on the skin around, above, or below the vascular access.  You have bleeding at the vascular access that cannot be easily controlled.  Your catheter gets pulled out of place.  You feel your heart racing or skipping beats.  You have chest pain. Summary  A vascular access is a connection to the blood inside your blood vessels that allows blood to be easily removed from your body and returned to your body during kidney dialysis (hemodialysis).  There are three types of vascular accesses.The type of access that is best for you depends on the size and strength of your  veins, your age, and any other health problems that you have, such as diabetes.  A fistula is usually the preferred type of access, although it is not an option for everyone. It can last several years and is less likely than the other types of accesses to become infected or to cause a blood clot within a blood vessel (thrombosis).  Wear a medical alert bracelet. In case of an emergency, this tells health care providers that you are a dialysis patient. This information is not intended to replace advice given to you by your health care provider. Make sure you discuss any questions you have with your health care provider. Document Revised: 09/09/2018 Document Reviewed: 06/15/2016 Elsevier Patient Education  [place].

## 2019-08-24 ENCOUNTER — Telehealth (INDEPENDENT_AMBULATORY_CARE_PROVIDER_SITE_OTHER): Payer: Self-pay

## 2019-08-24 NOTE — Telephone Encounter (Signed)
Alex Larson called and left a message asking that you return her call.

## 2019-08-24 NOTE — Progress Notes (Signed)
Patient ID: Alex Larson, male    DOB: 1927-03-04, 86 y.o.   MRN: [id number]  HPI  Alex Larson is a 86 y/o male with a history of CAD, HTN, CKD, PAD and chronic heart failure.   Echo report from 10/28/2018 reviewed and showed an EF of 10-15% along with moderate/ severe TR.  Admitted 07/16/19 due to GIB. GI consult obtained. 2 units of PRBC's given. EGD and colonoscopy done. Dose of IV lasix given for heart failure. Discharged after 4 days.   Alex Larson presents today for a follow-up visit with a chief complaint of minimal shortness of breath upon moderate exertion. Alex Larson describes this as chronic in nature having been present for several years. Alex Larson has not associated symptoms and specifically denies any difficulty sleeping, abdominal distention, palpitations, pedal edema, chest pain, dizziness, cough, fatigue or weight gain.   Alex Larson and his daughter say that Alex Larson is scheduled to get dialysis catheter placed early next week for future dialysis.   Past Medical History:  Diagnosis Date  . CAD (coronary artery disease)   . CKD (chronic kidney disease), stage IV ([place])   . HFrEF (heart failure with reduced ejection fraction) ([place])    a. 10/2018 Echo: EF 10-15%, DD. Diff HK. RVSP 70.4 mmHg. Mildly dil LA. Mod dil RA. Mod-sev TR. Ao sclerosis w/o stenosis.  . Hypertension   . Ischemic cardiomyopathy   . PAD (peripheral artery disease) ([name])    a. s/p RLE bypass.   Past Surgical History:  Procedure Laterality Date  . BYPASS GRAFT     Right lower extremity  . COLONOSCOPY WITH PROPOFOL N/A 07/18/2019   Procedure: COLONOSCOPY WITH PROPOFOL;  Surgeon: Lin Landsman, MD;  Location[facility] ENDOSCOPY;  Service: Gastroenterology;  Laterality: N/A;  . CORONARY STENT INTERVENTION     x2 in [place].  Details unknown.  . ESOPHAGOGASTRODUODENOSCOPY (EGD) WITH PROPOFOL N/A 07/18/2019   Procedure: ESOPHAGOGASTRODUODENOSCOPY (EGD) WITH PROPOFOL;  Surgeon: Lin Landsman, MD;  Location: [place];  Service:  Gastroenterology;  Laterality: N/A;  . REPLACEMENT TOTAL KNEE BILATERAL     Family History  Problem Relation Age of Onset  . Breast cancer Mother    Social History   Tobacco Use  . Smoking status: Former Research scientist (life sciences)  . Smokeless tobacco: Former Systems developer  . Tobacco comment: quit age 73, smoked from teenage years  Substance Use Topics  . Alcohol use: Not Currently   No Known Allergies  Prior to Admission medications   Medication Sig Start Date End Date Taking? Authorizing Provider  amLODipine (NORVASC) 5 MG tablet Take 1 tablet (5 mg total) by mouth daily. 05/09/19  Yes Darylene Price A, FNP  aspirin EC 81 MG tablet Take 1 tablet (81 mg total) by mouth daily. 07/29/19  Yes End, Harrell Gave, MD  atorvastatin (LIPITOR) 40 MG tablet Take 1 tablet (40 mg total) by mouth daily. 05/13/19 08/25/19 Yes Theora Gianotti, NP  carvedilol (COREG) 3.125 MG tablet Take 1 tablet (3.125 mg total) by mouth 2 (two) times daily with a meal. 11/04/18  Yes Darylene Price A, FNP  ferrous sulfate 220 (44 Fe) MG/5ML solution Take 220 mg by mouth daily.   Yes [provider]  furosemide (LASIX) 80 MG tablet Take 1 tablet (80 mg total) by mouth daily. 07/20/19  Yes Fritzi Mandes, MD  isosorbide mononitrate (ISMO) 10 MG tablet Take 1 tablet by mouth once daily 06/27/19  Yes Kodah Maret, Hubbard A, FNP  multivitamin-iron-minerals-folic acid (CENTRUM) chewable tablet Chew 1 tablet by mouth daily.  Yes [provider]  Nutritional Supplements (FEEDING SUPPLEMENT, NEPRO CARB STEADY,) LIQD Take 237 mLs by mouth 2 (two) times daily between meals. 07/20/19  Yes Fritzi Mandes, MD  Nutritional Supplements (NEPRO) LIQD Take 237 mLs by mouth 2 (two) times daily between meals., 07/24/19  Yes Leone Haven, MD  pantoprazole (PROTONIX) 40 MG tablet Take 1 tablet (40 mg total) by mouth daily. 06/10/19  Yes Leone Haven, MD  levothyroxine (SYNTHROID) 50 MCG tablet Take 1 tablet (50 mcg total) by mouth daily at 6 (six) AM.  08/25/19   Leone Haven, MD     Review of Systems  Constitutional: Negative for appetite change and fatigue.  HENT: Negative for congestion, postnasal drip and sore throat.   Eyes: Negative.   Respiratory: Positive for shortness of breath (with moderate exertion). Negative for cough.   Cardiovascular: Negative for chest pain, palpitations and leg swelling.  Gastrointestinal: Negative for abdominal distention, abdominal pain and blood in stool.  Endocrine: Negative.   Genitourinary: Negative.   Musculoskeletal: Negative for arthralgias and back pain.  Skin: Negative.   Allergic/Immunologic: Negative.   Neurological: Negative for dizziness and light-headedness.  Hematological: Negative for adenopathy. Does not bruise/bleed easily.  Psychiatric/Behavioral: Negative for dysphoric mood and sleep disturbance (sleeping on 1 pillow; wearing oxygen at 2L at bedtime). The patient is not nervous/anxious.    Vitals:   08/25/19 1034  BP: 124/62  Pulse: 60  Resp: 18  SpO2: 93%  Weight: 161 lb 8 oz (73.3 kg)  Height: 6\' 2"  (1.88 m)  PF: (!) 3 L/min   Wt Readings from Last 3 Encounters:  08/25/19 161 lb 8 oz (73.3 kg)  08/21/19 161 lb (73 kg)  08/13/19 165 lb 2 oz (74.9 kg)   Lab Results  Component Value Date   CREATININE 3.98 (H) 08/13/2019   CREATININE 5.23 (H) 07/27/2019   CREATININE 4.67 (H) 07/19/2019     Physical Exam Vitals and nursing note reviewed.  Constitutional:      Appearance: Alex Larson is well-developed.  HENT:     Head: Normocephalic and atraumatic.  Neck:     Vascular: No JVD.  Cardiovascular:     Rate and Rhythm: Normal rate and regular rhythm.  Pulmonary:     Effort: Pulmonary effort is normal. No respiratory distress.     Breath sounds: No wheezing or rales.  Abdominal:     Palpations: Abdomen is soft.     Tenderness: There is no abdominal tenderness.  Musculoskeletal:        General: No tenderness.     Cervical back: Normal range of motion.     Right  lower leg: No tenderness. No edema.     Left lower leg: No tenderness. No edema.  Skin:    General: Skin is warm and dry.  Neurological:     General: No focal deficit present.     Mental Status: Alex Larson is alert and oriented to person, place, and time.  Psychiatric:        Behavior: Behavior normal.    Assessment & Plan:  1. Chronic heart failure with reduced ejection fraction- - NYHA class II - euvolemic  - weighing daily; reminded to call for an overnight weight gain of >2 pounds or a weekly weight gain of >5 pounds - weight down 8 pounds from last visit 4 months ago - not adding salt and is trying to eat low sodium foods - renal function does not allow for entresto titration or initiation of  farxiga - saw cardiology Sharolyn Douglas) 08/13/19 - consider titrating carvedilol if HR allows - BNP 07/16/19 was 3427.0  2: HTN- - BP looks good today - had telemedicine visit with PCP Caryl Bis) 07/22/19 - BMP from 08/13/19 reviewed and showed sodium 137, potassium 4.7, creatinine 3.98 and GFR 14   3: CKD stage V- - saw nephrology (Kolluru) 07/29/19 - saw vascular (Dew) 08/21/19 - upcoming procedure to have dialysis access placed    Medication list was reviewed.  Return in 3 months or sooner for any questions/problems before then.

## 2019-08-25 ENCOUNTER — Encounter: Payer: Self-pay | Admitting: Family

## 2019-08-25 ENCOUNTER — Ambulatory Visit: Payer: Medicare PPO | Attending: Family | Admitting: Family

## 2019-08-25 ENCOUNTER — Telehealth: Payer: Self-pay

## 2019-08-25 ENCOUNTER — Other Ambulatory Visit: Payer: Self-pay

## 2019-08-25 ENCOUNTER — Telehealth (INDEPENDENT_AMBULATORY_CARE_PROVIDER_SITE_OTHER): Payer: Self-pay

## 2019-08-25 ENCOUNTER — Encounter: Payer: Self-pay | Admitting: Gastroenterology

## 2019-08-25 VITALS — BP 124/62 | HR 60 | Resp 18 | Ht 74.0 in | Wt 161.5 lb

## 2019-08-25 DIAGNOSIS — N185 Chronic kidney disease, stage 5: Secondary | ICD-10-CM | POA: Diagnosis not present

## 2019-08-25 DIAGNOSIS — I739 Peripheral vascular disease, unspecified: Secondary | ICD-10-CM | POA: Insufficient documentation

## 2019-08-25 DIAGNOSIS — Z87891 Personal history of nicotine dependence: Secondary | ICD-10-CM | POA: Diagnosis not present

## 2019-08-25 DIAGNOSIS — I2510 Atherosclerotic heart disease of native coronary artery without angina pectoris: Secondary | ICD-10-CM | POA: Insufficient documentation

## 2019-08-25 DIAGNOSIS — I132 Hypertensive heart and chronic kidney disease with heart failure and with stage 5 chronic kidney disease, or end stage renal disease: Secondary | ICD-10-CM | POA: Diagnosis not present

## 2019-08-25 DIAGNOSIS — Z7982 Long term (current) use of aspirin: Secondary | ICD-10-CM | POA: Insufficient documentation

## 2019-08-25 DIAGNOSIS — I129 Hypertensive chronic kidney disease with stage 1 through stage 4 chronic kidney disease, or unspecified chronic kidney disease: Secondary | ICD-10-CM | POA: Diagnosis not present

## 2019-08-25 DIAGNOSIS — Z955 Presence of coronary angioplasty implant and graft: Secondary | ICD-10-CM | POA: Diagnosis not present

## 2019-08-25 DIAGNOSIS — N2581 Secondary hyperparathyroidism of renal origin: Secondary | ICD-10-CM | POA: Diagnosis not present

## 2019-08-25 DIAGNOSIS — Z79899 Other long term (current) drug therapy: Secondary | ICD-10-CM | POA: Diagnosis not present

## 2019-08-25 DIAGNOSIS — D631 Anemia in chronic kidney disease: Secondary | ICD-10-CM | POA: Diagnosis not present

## 2019-08-25 DIAGNOSIS — I5022 Chronic systolic (congestive) heart failure: Secondary | ICD-10-CM | POA: Diagnosis not present

## 2019-08-25 DIAGNOSIS — N184 Chronic kidney disease, stage 4 (severe): Secondary | ICD-10-CM | POA: Diagnosis not present

## 2019-08-25 DIAGNOSIS — Z96653 Presence of artificial knee joint, bilateral: Secondary | ICD-10-CM | POA: Diagnosis not present

## 2019-08-25 DIAGNOSIS — I255 Ischemic cardiomyopathy: Secondary | ICD-10-CM | POA: Insufficient documentation

## 2019-08-25 DIAGNOSIS — I10 Essential (primary) hypertension: Secondary | ICD-10-CM

## 2019-08-25 MED ORDER — LEVOTHYROXINE SODIUM 50 MCG PO TABS: 50.0000 ug | ORAL_TABLET | Freq: Every day | ORAL | 1 refills | Status: AC

## 2019-08-25 NOTE — Patient Instructions (Signed)
Continue weighing daily and call for an overnight weight gain of > 2 pounds or a weekly weight gain of >5 pounds. 

## 2019-08-25 NOTE — Telephone Encounter (Signed)
Spoke with the patient's daughter and he is now scheduled with Dr. Lucky Cowboy for a permcath insertion on 08/31/19 with a 8:15 am arrival time to the [facility]. Patient will do covid testing on 08/27/19 between 8-1 pm at the [facility]. Pre-procedure instructions were discussed with the patient and will be mailed.

## 2019-08-26 ENCOUNTER — Ambulatory Visit (INDEPENDENT_AMBULATORY_CARE_PROVIDER_SITE_OTHER): Payer: Medicare PPO | Admitting: Gastroenterology

## 2019-08-26 ENCOUNTER — Encounter: Payer: Self-pay | Admitting: Gastroenterology

## 2019-08-26 DIAGNOSIS — D509 Iron deficiency anemia, unspecified: Secondary | ICD-10-CM

## 2019-08-26 NOTE — Telephone Encounter (Signed)
I called the patient's daughter and informed her that the provider stated it was ok to cancel the appointment, I informed the daughter to call back to reschedule if she needed to and she understood.  Ahlani Wickes,cma

## 2019-08-26 NOTE — Progress Notes (Signed)
Alex Larson, [place] [address]  Main: [phone]  Fax: [phone]   Gastroenterology Consultation  Referring Provider:     Leone Haven, MD Primary Care Physician:  Leone Haven, MD Reason for Consultation:     Iron deficiency anemia        HPI:   Virtual Visit via Video Note  I connected with patient on 08/26/19 at 10:30 AM EDT by video ([email or web address]) and verified that I am speaking with the correct person using two identifiers.   I discussed the limitations, risks, security and privacy concerns of performing an evaluation and management service by video and the availability of in person appointments. I also discussed with the patient that there may be a patient responsible charge related to this service. The patient expressed understanding and agreed to proceed.  Location of the patient: Home Location of provider: Home Participating persons: Patient, daughter Alex Larson) and provider only (Nursing staff checked in patient via phone but were not physically involved in the video interaction - see their notes)   History of Present Illness: Chief Complaint  Patient presents with  . New Patient (Initial Visit)  . Hospitalization Follow-up    No symptoms     Alex Larson is a 86 y.o. y/o male referred for consultation & management  by Dr. Caryl Bis, Angela Adam, MD.  Patient recently hospitalized in February 2021 with acute anemia.  He underwent EGD, colonoscopy and small bowel capsule study with Dr. Terri Piedra and was found to have angiectasia's in the small bowel that were cauterized.  These were nonbleeding.  Nonspecific red spot was also seen on the small bowel capsule study.  Patient's hemoglobin has since improved and hemoglobin was about 10 on July 27, 2019 and was as low as around 6 during his hospitalization.  The patient denies abdominal or flank pain, anorexia, nausea or vomiting, dysphagia, change in bowel habits or  black or bloody stools or weight loss.   Past Medical History:  Diagnosis Date  . CAD (coronary artery disease)   . CHF (congestive heart failure) ([place])   . CKD (chronic kidney disease), stage IV ([place])   . HFrEF (heart failure with reduced ejection fraction) ([place])    a. 10/2018 Echo: EF 10-15%, DD. Diff HK. RVSP 70.4 mmHg. Mildly dil LA. Mod dil RA. Mod-sev TR. Ao sclerosis w/o stenosis.  . Hypertension   . Ischemic cardiomyopathy   . PAD (peripheral artery disease) ([name])    a. s/p RLE bypass.    Past Surgical History:  Procedure Laterality Date  . BYPASS GRAFT     Right lower extremity  . COLONOSCOPY WITH PROPOFOL N/A 07/18/2019   Procedure: COLONOSCOPY WITH PROPOFOL;  Surgeon: Lin Landsman, MD;  Location: [facility] ENDOSCOPY;  Service: Gastroenterology;  Laterality: N/A;  . CORONARY STENT INTERVENTION     x2 in [place].  Details unknown.  . ESOPHAGOGASTRODUODENOSCOPY (EGD) WITH PROPOFOL N/A 07/18/2019   Procedure: ESOPHAGOGASTRODUODENOSCOPY (EGD) WITH PROPOFOL;  Surgeon: Lin Landsman, MD;  Location: [facility];  Service: Gastroenterology;  Laterality: N/A;  . REPLACEMENT TOTAL KNEE BILATERAL      Prior to Admission medications   Medication Sig Start Date End Date Taking? Authorizing Provider  amLODipine (NORVASC) 5 MG tablet Take 1 tablet (5 mg total) by mouth daily. 05/09/19  Yes Darylene Price A, FNP  aspirin EC 81 MG tablet Take 1 tablet (81 mg total) by mouth daily. 07/29/19  Yes End, Harrell Gave, MD  atorvastatin (LIPITOR) 40 MG tablet Take 1 tablet (40 mg total) by mouth daily. 05/13/19 08/25/19 Yes Theora Gianotti, NP  carvedilol (COREG) 3.125 MG tablet Take 1 tablet (3.125 mg total) by mouth 2 (two) times daily with a meal. 11/04/18  Yes Darylene Price A, FNP  ferrous sulfate 220 (44 Fe) MG/5ML solution Take 220 mg by mouth daily.   Yes [provider]  furosemide (LASIX) 80 MG tablet Take 1 tablet (80 mg total) by mouth daily. 07/20/19  Yes Fritzi Mandes, MD  isosorbide mononitrate (ISMO) 10 MG tablet Take 1 tablet by mouth once daily 06/27/19  Yes Hackney, [place] A, FNP  multivitamin-iron-minerals-folic acid (CENTRUM) chewable tablet Chew 1 tablet by mouth daily.   Yes [provider]  Nutritional Supplements (FEEDING SUPPLEMENT, NEPRO CARB STEADY,) LIQD Take 237 mLs by mouth 2 (two) times daily between meals. 07/20/19  Yes Fritzi Mandes, MD  Nutritional Supplements (NEPRO) LIQD Take 237 mLs by mouth 2 (two) times daily between meals., 07/24/19  Yes Leone Haven, MD  pantoprazole (PROTONIX) 40 MG tablet Take 1 tablet (40 mg total) by mouth daily. 06/10/19  Yes Leone Haven, MD  levothyroxine (SYNTHROID) 50 MCG tablet Take 1 tablet (50 mcg total) by mouth daily at 6 (six) AM. 08/25/19   Leone Haven, MD    Family History  Problem Relation Age of Onset  . Breast cancer Mother      Social History   Tobacco Use  . Smoking status: Former Research scientist (life sciences)  . Smokeless tobacco: Former Systems developer  . Tobacco comment: quit age 35, smoked from teenage years  Substance Use Topics  . Alcohol use: Not Currently  . Drug use: Never    Allergies as of 08/26/2019  . (No Known Allergies)    Review of Systems:    All systems reviewed and negative except where noted in HPI.   Observations/Objective:  Labs: CBC    Component Value Date/Time   WBC 5.1 07/27/2019 1110   WBC 4.4 07/17/2019 0912   RBC 3.50 (L) 07/27/2019 1110   RBC 2.35 (L) 07/17/2019 0912   HGB 10.6 (L) 07/27/2019 1110   HCT 31.8 (L) 07/27/2019 1110   PLT 218 07/27/2019 1110   MCV 91 07/27/2019 1110   MCH 30.3 07/27/2019 1110   MCH 31.5 07/17/2019 0912   MCHC 33.3 07/27/2019 1110   MCHC 32.7 07/17/2019 0912   RDW 16.8 (H) 07/27/2019 1110   LYMPHSABS 0.5 (L) 10/27/2018 0352   MONOABS 0.6 10/27/2018 0352   EOSABS 0.0 10/27/2018 0352   BASOSABS 0.0 10/27/2018 0352   CMP     Component Value Date/Time   NA 137 08/13/2019 1232   NA 142 07/27/2019 1110   K 4.7  08/13/2019 1232   CL 105 08/13/2019 1232   CO2 21 (L) 08/13/2019 1232   GLUCOSE 97 08/13/2019 1232   BUN 88 (H) 08/13/2019 1232   BUN 62 (H) 07/27/2019 1110   CREATININE 3.98 (H) 08/13/2019 1232   CALCIUM 10.6 (H) 08/13/2019 1232   PROT 7.4 07/16/2019 2307   ALBUMIN 3.6 07/16/2019 2307   AST 45 (H) 07/16/2019 2307   ALT 22 07/16/2019 2307   ALKPHOS 67 07/16/2019 2307   BILITOT 0.9 07/16/2019 2307   GFRNONAA 12 (L) 08/13/2019 1232   GFRAA 14 (L) 08/13/2019 1232    Imaging Studies: VAS [place] UPPER EXTREMITY ARTERIAL DUPLEX  Result Date: 08/25/2019 UPPER EXTREMITY DUPLEX STUDY Performing Technologist: Blondell Reveal RT, [place], RVT Supporting Technologist: Charlane Ferretti  RT (R)(VS)  Examination Guidelines: A complete evaluation includes B-mode imaging, spectral Doppler, color Doppler, and power Doppler as needed of all accessible portions of each vessel. Bilateral testing is considered an integral part of a complete examination. Limited examinations for reoccurring indications may be performed as noted.  Right Pre-Dialysis Findings: +----------------+----------+----------------+----------------+----------------+ Location        PSV (cm/s)Intralum. Diam. Waveform        Comments                                   (cm)                                             +----------------+----------+----------------+----------------+----------------+ Brachial        92        0.43            triphasic                        Antecub. fossa                                                             +----------------+----------+----------------+----------------+----------------+ Radial Art at                             no flow detectedno flow detected Wrist                                                     from proximal                                                              forearm to wrist  +----------------+----------+----------------+----------------+----------------+ Ulnar Art at                              no flow detectedno flow from mid Wrist                                                     forearm to wrist +----------------+----------+----------------+----------------+----------------+ Left Pre-Dialysis Findings: +----------------+----------+----------------+----------------+----------------+ Location        PSV (cm/s)Intralum. Diam. Waveform        Comments                                   (cm)                                             +----------------+----------+----------------+----------------+----------------+  Brachial        72        0.52            biphasic                         Antecub. fossa                                                             +----------------+----------+----------------+----------------+----------------+ Radial Art at                             no flow detectedno flow detected Wrist                                                     from distal                                                                forearm to wrist +----------------+----------+----------------+----------------+----------------+ Ulnar Art at                              no flow detectedno flow detected Wrist                                                     from mid forearm                                                           to wrist         +----------------+----------+----------------+----------------+----------------+  Summary:  Right: Evidence of chronic occlusion of the distal radial and ulnar        arteries with flow primarily supplied to the hand area via        interosseus arteries and other intermittent collaterals. Left: Evidence of chronic occlusion of the distal radial and ulnar       arteries with flow primarily supplied to the hand area via       interosseus arteries and other  intermittent collaterals. *See table(s) above for measurements and observations. Electronically signed by Leotis Pain MD on 08/25/2019 at 3:08:18 PM.    Final    VAS [place] UPPER EXT VEIN MAPPING (PRE-OP AVF)  Result Date: 08/25/2019 UPPER EXTREMITY VEIN MAPPING Performing Technologist: Charlane Ferretti RT (R)(VS) Supporting Technologist: Blondell Reveal RT, RDMS, RVT  Examination Guidelines: A complete evaluation includes B-mode imaging, spectral Doppler, color Doppler, and power Doppler as needed of all accessible portions of each vessel. Bilateral  testing is considered an integral part of a complete examination. Limited examinations for reoccurring indications may be performed as noted. +-----------------+-------------+----------+--------+ Right Cephalic   Diameter (cm)Depth (cm)Findings +-----------------+-------------+----------+--------+ Shoulder             0.15                        +-----------------+-------------+----------+--------+ Prox upper arm       0.13                        +-----------------+-------------+----------+--------+ Mid upper arm        0.09                        +-----------------+-------------+----------+--------+ Dist upper arm       0.07                        +-----------------+-------------+----------+--------+ Antecubital fossa    0.07                        +-----------------+-------------+----------+--------+ Prox forearm         0.21                        +-----------------+-------------+----------+--------+ Mid forearm          0.16                        +-----------------+-------------+----------+--------+ Dist forearm         0.13                        +-----------------+-------------+----------+--------+ +-----------------+-------------+----------+---------+ Right Basilic    Diameter (cm)Depth (cm)Findings  +-----------------+-------------+----------+---------+ Prox upper arm       0.85                          +-----------------+-------------+----------+---------+ Mid upper arm        0.48                         +-----------------+-------------+----------+---------+ Dist upper arm       0.43               branching +-----------------+-------------+----------+---------+ Antecubital fossa    0.28               branching +-----------------+-------------+----------+---------+ Prox forearm         0.26                         +-----------------+-------------+----------+---------+ +-----------------+-------------+----------+--------+ Left Cephalic    Diameter (cm)Depth (cm)Findings +-----------------+-------------+----------+--------+ Shoulder             0.26                        +-----------------+-------------+----------+--------+ Prox upper arm       0.16                        +-----------------+-------------+----------+--------+ Mid upper arm        0.22                        +-----------------+-------------+----------+--------+ Dist upper arm       0.14                        +-----------------+-------------+----------+--------+  Antecubital fossa    0.27                        +-----------------+-------------+----------+--------+ Prox forearm         0.09                        +-----------------+-------------+----------+--------+ +-----------------+-------------+----------+---------+ Left Basilic     Diameter (cm)Depth (cm)Findings  +-----------------+-------------+----------+---------+ Prox upper arm       0.51                         +-----------------+-------------+----------+---------+ Mid upper arm        0.36               branching +-----------------+-------------+----------+---------+ Dist upper arm       0.28               branching +-----------------+-------------+----------+---------+ Antecubital fossa    0.21                         +-----------------+-------------+----------+---------+ Prox forearm         0.12                          +-----------------+-------------+----------+---------+ Summary: Right: Right cephalic and basilic veins are patent and compressable        with branching in the distal and ACF regions. Left: Left cephalic and basilic veins are patent and compressable       with branching in the mid and distal regions. *See table(s) above for measurements and observations.  Diagnosing physician: Leotis Pain MD Electronically signed by Leotis Pain MD on 08/25/2019 at 3:08:20 PM.    Final     Assessment and Plan:   Alex Larson is a 86 y.o. y/o male has been referred for iron deficiency anemia  Assessment and Plan: Hemoglobin improving since hospitalization  Due to the severity of his anemia and iron deficiency, will refer to hematology to discuss IV iron replacement if needed.  Patient and family agreeable  No alarm symptoms present at this time  Follow Up Instructions:   I discussed the assessment and treatment plan with the patient. The patient was provided an opportunity to ask questions and all were answered. The patient agreed with the plan and demonstrated an understanding of the instructions.   The patient was advised to call back or seek an in-person evaluation if the symptoms worsen or if the condition fails to improve as anticipated.  I provided 15 minutes of face-to-face time via video software during this encounter.  Additional time was spent in reviewing patient's chart, placing orders etc.   Virgel Manifold, MD  Speech recognition software was used to dictate the above note.

## 2019-08-26 NOTE — Addendum Note (Signed)
Addended by: Ulyess Blossom L on: 08/26/2019 11:03 AM   Modules accepted: Orders

## 2019-08-26 NOTE — Telephone Encounter (Signed)
He does not have to see me Friday.  He could be rescheduled if they desire.  I already refilled his Synthroid.  Thanks.

## 2019-08-26 NOTE — Telephone Encounter (Signed)
Pt's daughter wants to cancel Friday appt in person because pt has had a lot of appts this week and is exhausted. She just needs to know if he HAS to be seen Friday by Dr Caryl Bis before procedure on Monday. Please call Jacqualyn back asap. Pt also has questions about a refill

## 2019-08-27 ENCOUNTER — Other Ambulatory Visit
Admission: RE | Admit: 2019-08-27 | Discharge: 2019-08-27 | Disposition: A | Discharge status: Home / Self Care | Payer: Medicare PPO | Source: Ambulatory Visit | Attending: Vascular Surgery | Admitting: Vascular Surgery

## 2019-08-27 ENCOUNTER — Other Ambulatory Visit: Payer: Self-pay

## 2019-08-27 DIAGNOSIS — Z01812 Encounter for preprocedural laboratory examination: Secondary | ICD-10-CM | POA: Diagnosis not present

## 2019-08-27 DIAGNOSIS — Z20822 Contact with and (suspected) exposure to covid-19: Secondary | ICD-10-CM | POA: Insufficient documentation

## 2019-08-28 ENCOUNTER — Ambulatory Visit: Payer: Medicare PPO | Admitting: Family Medicine

## 2019-08-28 LAB — SARS CORONAVIRUS 2 (TAT 6-24 HRS): SARS Coronavirus 2: NEGATIVE

## 2019-08-30 ENCOUNTER — Other Ambulatory Visit (INDEPENDENT_AMBULATORY_CARE_PROVIDER_SITE_OTHER): Payer: Self-pay | Admitting: Nurse Practitioner

## 2019-08-31 ENCOUNTER — Ambulatory Visit
Admission: RE | Admit: 2019-08-31 | Discharge: 2019-08-31 | Disposition: A | Discharge status: Home / Self Care | Payer: Medicare PPO | Attending: Vascular Surgery | Admitting: Vascular Surgery

## 2019-08-31 ENCOUNTER — Encounter
Admission: RE | Disposition: A | Discharge status: Home / Self Care | Payer: Self-pay | Source: Home / Self Care | Attending: Vascular Surgery

## 2019-08-31 ENCOUNTER — Encounter: Payer: Self-pay | Admitting: Cardiology

## 2019-08-31 DIAGNOSIS — N185 Chronic kidney disease, stage 5: Secondary | ICD-10-CM | POA: Diagnosis not present

## 2019-08-31 DIAGNOSIS — Z79890 Hormone replacement therapy (postmenopausal): Secondary | ICD-10-CM | POA: Diagnosis not present

## 2019-08-31 DIAGNOSIS — I2510 Atherosclerotic heart disease of native coronary artery without angina pectoris: Secondary | ICD-10-CM | POA: Diagnosis not present

## 2019-08-31 DIAGNOSIS — I5042 Chronic combined systolic (congestive) and diastolic (congestive) heart failure: Secondary | ICD-10-CM | POA: Diagnosis not present

## 2019-08-31 DIAGNOSIS — Z7982 Long term (current) use of aspirin: Secondary | ICD-10-CM | POA: Insufficient documentation

## 2019-08-31 DIAGNOSIS — N186 End stage renal disease: Secondary | ICD-10-CM | POA: Insufficient documentation

## 2019-08-31 DIAGNOSIS — I132 Hypertensive heart and chronic kidney disease with heart failure and with stage 5 chronic kidney disease, or end stage renal disease: Secondary | ICD-10-CM | POA: Diagnosis not present

## 2019-08-31 DIAGNOSIS — Z87891 Personal history of nicotine dependence: Secondary | ICD-10-CM | POA: Insufficient documentation

## 2019-08-31 DIAGNOSIS — I739 Peripheral vascular disease, unspecified: Secondary | ICD-10-CM | POA: Insufficient documentation

## 2019-08-31 DIAGNOSIS — I255 Ischemic cardiomyopathy: Secondary | ICD-10-CM | POA: Insufficient documentation

## 2019-08-31 DIAGNOSIS — Z79899 Other long term (current) drug therapy: Secondary | ICD-10-CM | POA: Insufficient documentation

## 2019-08-31 DIAGNOSIS — N189 Chronic kidney disease, unspecified: Secondary | ICD-10-CM

## 2019-08-31 HISTORY — PX: DIALYSIS/PERMA CATHETER INSERTION: CATH118288

## 2019-08-31 LAB — POTASSIUM (ARMC VASCULAR LAB ONLY): Potassium (ARMC vascular lab): 4.5 (ref 3.5–5.1)

## 2019-08-31 SURGERY — DIALYSIS/PERMA CATHETER INSERTION
Anesthesia: Moderate Sedation

## 2019-08-31 MED ORDER — ONDANSETRON HCL 4 MG/2ML IJ SOLN: 4.0000 mg | Freq: Four times a day (QID) | INTRAMUSCULAR | Status: DC | PRN

## 2019-08-31 MED ORDER — SODIUM CHLORIDE 0.9 % IV SOLN
INTRAVENOUS | Status: DC
  Administered 2019-08-31: 10:00:00 1000 mL via INTRAVENOUS

## 2019-08-31 MED ORDER — HEPARIN SODIUM (PORCINE) 10000 UNIT/ML IJ SOLN
INTRAMUSCULAR | Status: AC
  Filled 2019-08-31: qty 1

## 2019-08-31 MED ORDER — MIDAZOLAM HCL 5 MG/5ML IJ SOLN
INTRAMUSCULAR | Status: AC
  Filled 2019-08-31: qty 5

## 2019-08-31 MED ORDER — DIPHENHYDRAMINE HCL 50 MG/ML IJ SOLN: 50.0000 mg | Freq: Once | INTRAMUSCULAR | Status: DC | PRN

## 2019-08-31 MED ORDER — MIDAZOLAM HCL 2 MG/2ML IJ SOLN
INTRAMUSCULAR | Status: DC | PRN
  Administered 2019-08-31: 1 mg via INTRAVENOUS

## 2019-08-31 MED ORDER — FAMOTIDINE 20 MG PO TABS: 40.0000 mg | ORAL_TABLET | Freq: Once | ORAL | Status: DC | PRN

## 2019-08-31 MED ORDER — FENTANYL CITRATE (PF) 100 MCG/2ML IJ SOLN
INTRAMUSCULAR | Status: AC
  Filled 2019-08-31: qty 2

## 2019-08-31 MED ORDER — MIDAZOLAM HCL 2 MG/ML PO SYRP: 8.0000 mg | ORAL_SOLUTION | Freq: Once | ORAL | Status: DC | PRN

## 2019-08-31 MED ORDER — METHYLPREDNISOLONE SODIUM SUCC 125 MG IJ SOLR: 125.0000 mg | Freq: Once | INTRAMUSCULAR | Status: DC | PRN

## 2019-08-31 MED ORDER — HYDROMORPHONE HCL 1 MG/ML IJ SOLN: 1.0000 mg | Freq: Once | INTRAMUSCULAR | Status: DC | PRN

## 2019-08-31 MED ORDER — FENTANYL CITRATE (PF) 100 MCG/2ML IJ SOLN
INTRAMUSCULAR | Status: DC | PRN
  Administered 2019-08-31: 25 ug via INTRAVENOUS

## 2019-08-31 MED ORDER — CEFAZOLIN SODIUM-DEXTROSE 1-4 GM/50ML-% IV SOLN
INTRAVENOUS | Status: AC
  Filled 2019-08-31: qty 50

## 2019-08-31 MED ORDER — CEFAZOLIN SODIUM-DEXTROSE 1-4 GM/50ML-% IV SOLN
1.0000 g | Freq: Once | INTRAVENOUS | Status: AC
  Administered 2019-08-31: 1 g via INTRAVENOUS

## 2019-08-31 SURGICAL SUPPLY — 6 items
CATH PALINDROME RT-P 15FX19CM (CATHETERS) ×3 IMPLANT
DERMABOND ADVANCED (GAUZE/BANDAGES/DRESSINGS) ×2
DERMABOND ADVANCED .7 DNX12 (GAUZE/BANDAGES/DRESSINGS) ×1 IMPLANT
PACK ANGIOGRAPHY (CUSTOM PROCEDURE TRAY) ×3 IMPLANT
SUT MNCRL AB 4-0 PS2 18 (SUTURE) ×3 IMPLANT
SUT PROLENE 0 CT 1 30 (SUTURE) ×3 IMPLANT

## 2019-08-31 NOTE — Op Note (Signed)
OPERATIVE NOTE    PRE-OPERATIVE DIAGNOSIS: 1. ESRD   POST-OPERATIVE DIAGNOSIS: same as above  PROCEDURE: 1. Ultrasound guidance for vascular access to the right internal jugular vein 2. Fluoroscopic guidance for placement of catheter 3. Placement of a 19 cm tip to cuff tunneled hemodialysis catheter via the right internal jugular vein  SURGEON: Leotis Pain, MD  ANESTHESIA:  Local with Moderate conscious sedation for approximately 15 minutes using 1 mg of Versed and 25 mcg of Fentanyl  ESTIMATED BLOOD LOSS: 3 cc  FLUORO TIME: less than one minute  CONTRAST: none  FINDING(S): 1.  Patent right internal jugular vein  SPECIMEN(S):  None  INDICATIONS:   Alex Larson is a 86 y.o.male who presents with renal failure and now needs to start dialysis.  The patient needs long term dialysis access for their ESRD, and a Permcath is necessary.  Risks and benefits are discussed and informed consent is obtained.    DESCRIPTION: After obtaining full informed written consent, the patient was brought back to the vascular suited. The patient's right neck and chest were sterilely prepped and draped in a sterile surgical field was created. Moderate conscious sedation was administered during a face to face encounter with the patient throughout the procedure with my supervision of the RN administering medicines and monitoring the patient's vital signs, pulse oximetry, telemetry and mental status throughout from the start of the procedure until the patient was taken to the recovery room.  The right internal jugular vein was visualized with ultrasound and found to be patent. It was then accessed under direct ultrasound guidance and a permanent image was recorded. A wire was placed. After skin nick and dilatation, the peel-away sheath was placed over the wire. I then turned my attention to an area under the clavicle. Approximately 1-2 fingerbreadths below the clavicle a small counterincision was created and  tunneled from the subclavicular incision to the access site. Using fluoroscopic guidance, a 19 centimeter tip to cuff tunneled hemodialysis catheter was selected, and tunneled from the subclavicular incision to the access site. It was then placed through the peel-away sheath and the peel-away sheath was removed. Using fluoroscopic guidance the catheter tips were parked in the right atrium. The appropriate distal connectors were placed. It withdrew blood well and flushed easily with heparinized saline and a concentrated heparin solution was then placed. It was secured to the chest wall with 2 Prolene sutures. The access incision was closed single 4-0 Monocryl. A 4-0 Monocryl pursestring suture was placed around the exit site. Sterile dressings were placed. The patient tolerated the procedure well and was taken to the recovery room in stable condition.  COMPLICATIONS: None  CONDITION: Stable  Leotis Pain, MD 08/31/2019 10:23 AM   This note was created with Dragon Medical transcription system. Any errors in dictation are purely unintentional.

## 2019-08-31 NOTE — H&P (Signed)
[facility] History & Physical Update  The patient was interviewed and re-examined.  The patient's previous History and Physical has been reviewed and is unchanged.  There is no change in the plan of care. He needs a catheter to start dialysis. We plan to proceed with the scheduled procedure.  Leotis Pain, MD  08/31/2019, 9:24 AM

## 2019-09-02 ENCOUNTER — Telehealth (INDEPENDENT_AMBULATORY_CARE_PROVIDER_SITE_OTHER): Payer: Self-pay

## 2019-09-02 NOTE — Telephone Encounter (Signed)
Patient daughter has been aware with medical advice and verbalize understanding

## 2019-09-02 NOTE — Telephone Encounter (Signed)
Changing the Gauze should be adequate, a little bit of bloody drainage is to be expected.  You shouldn't need to hold pressure or put anything other than gauze on the wound.

## 2019-09-03 ENCOUNTER — Telehealth: Payer: Self-pay | Admitting: Family Medicine

## 2019-09-03 ENCOUNTER — Inpatient Hospital Stay: Payer: Medicare PPO | Admitting: Oncology

## 2019-09-03 DIAGNOSIS — N2581 Secondary hyperparathyroidism of renal origin: Secondary | ICD-10-CM | POA: Diagnosis not present

## 2019-09-03 DIAGNOSIS — N186 End stage renal disease: Secondary | ICD-10-CM | POA: Diagnosis not present

## 2019-09-03 DIAGNOSIS — J9611 Chronic respiratory failure with hypoxia: Secondary | ICD-10-CM | POA: Diagnosis not present

## 2019-09-03 DIAGNOSIS — Z992 Dependence on renal dialysis: Secondary | ICD-10-CM | POA: Diagnosis not present

## 2019-09-03 NOTE — Telephone Encounter (Signed)
[facility] physical therapist said that they were going to discharge patient this week but they want to wait until next Monday or Wednesday. They want to get a couple rounds of dialysis treatments done before discharging patient.

## 2019-09-05 DIAGNOSIS — Z992 Dependence on renal dialysis: Secondary | ICD-10-CM | POA: Diagnosis not present

## 2019-09-05 DIAGNOSIS — N2581 Secondary hyperparathyroidism of renal origin: Secondary | ICD-10-CM | POA: Diagnosis not present

## 2019-09-05 DIAGNOSIS — N186 End stage renal disease: Secondary | ICD-10-CM | POA: Diagnosis not present

## 2019-09-05 NOTE — Telephone Encounter (Signed)
Noted  

## 2019-09-07 ENCOUNTER — Encounter: Payer: Self-pay | Admitting: Oncology

## 2019-09-07 NOTE — Progress Notes (Signed)
Pt contacted for NEW PT visit. Spoke to daughter/cargiver, Calton Dach, who provided patient's information. Per daughter, pt started dialysis this past weekend and will be going on Tues,thurs and saturdays. Original appt was on tues, but had to be moved to Friday 4/9 due to dialysis. No concerns voiced.

## 2019-09-08 ENCOUNTER — Inpatient Hospital Stay: Payer: Medicare PPO

## 2019-09-08 ENCOUNTER — Inpatient Hospital Stay: Payer: Medicare PPO | Admitting: Oncology

## 2019-09-08 DIAGNOSIS — N186 End stage renal disease: Secondary | ICD-10-CM | POA: Diagnosis not present

## 2019-09-08 DIAGNOSIS — Z992 Dependence on renal dialysis: Secondary | ICD-10-CM | POA: Diagnosis not present

## 2019-09-08 DIAGNOSIS — N2581 Secondary hyperparathyroidism of renal origin: Secondary | ICD-10-CM | POA: Diagnosis not present

## 2019-09-09 ENCOUNTER — Ambulatory Visit (INDEPENDENT_AMBULATORY_CARE_PROVIDER_SITE_OTHER): Payer: Medicare PPO | Admitting: Internal Medicine

## 2019-09-09 ENCOUNTER — Encounter: Payer: Self-pay | Admitting: Internal Medicine

## 2019-09-09 ENCOUNTER — Other Ambulatory Visit: Payer: Self-pay

## 2019-09-09 VITALS — BP 130/58 | HR 55 | Ht 74.0 in | Wt 157.1 lb

## 2019-09-09 DIAGNOSIS — N186 End stage renal disease: Secondary | ICD-10-CM | POA: Diagnosis not present

## 2019-09-09 DIAGNOSIS — I5022 Chronic systolic (congestive) heart failure: Secondary | ICD-10-CM | POA: Diagnosis not present

## 2019-09-09 DIAGNOSIS — J9611 Chronic respiratory failure with hypoxia: Secondary | ICD-10-CM | POA: Diagnosis not present

## 2019-09-09 DIAGNOSIS — I25118 Atherosclerotic heart disease of native coronary artery with other forms of angina pectoris: Secondary | ICD-10-CM | POA: Diagnosis not present

## 2019-09-09 MED ORDER — ISOSORBIDE MONONITRATE ER 30 MG PO TB24: 30.0000 mg | ORAL_TABLET | Freq: Every day | ORAL | 1 refills | Status: AC

## 2019-09-09 NOTE — Progress Notes (Signed)
Follow-up Outpatient Visit Date: 09/09/2019  Primary Care Provider: Leone Haven, MD [address] [place] [address]  Chief Complaint: Follow-up heart failure  HPI:  Mr. Alex Larson is a 86 y.o. male with history of coronary artery disease status post PCI x2 (details unknown), chronicHFrEF, Maan Zarcone-stage renal disease with recent initiation of hemodialysis, PAD status post RLE bypass, chronic respiratory failure with hypoxia, and hypertension, who presents for follow-up of coronary artery disease and chronic HFrEF.  I last saw him in late February, at which time he was gradually returning back to baseline after hospitalization in early February due to severe anemia secondary to multiple angioectasias in the duodenum.  Due to worsening renal function after that visit, Entresto was discontinued.  At the time of follow-up a month ago, he was feeling well.  He was back on the low-dose aspirin after having been cleared by GI.  Creatinine had also improved from 5.2 to 4.0, though Alex Larson ultimately underwent tunneled dialysis catheter placement and initiation of hemodialysis last week.  Today, Alex Larson reports that he is doing well.  He denies chest pain, shortness of breath, palpitations, lightheadedness, and edema.  He has tolerated his first 3 hemodialysis sessions well.  He typically wears 3 L of supplemental oxygen at home (currently on 2 L in the office).  --------------------------------------------------------------------------------------------------  Past Medical History:  Diagnosis Date  . CAD (coronary artery disease)   . CHF (congestive heart failure) ([place])   . CKD (chronic kidney disease), stage IV ([place])   . HFrEF (heart failure with reduced ejection fraction) ([place])    a. 10/2018 Echo: EF 10-15%, DD. Diff HK. RVSP 70.4 mmHg. Mildly dil LA. Mod dil RA. Mod-sev TR. Ao sclerosis w/o stenosis.  . Hypertension   . Ischemic cardiomyopathy   . PAD (peripheral artery  disease) ([name])    a. s/p RLE bypass.   Past Surgical History:  Procedure Laterality Date  . BYPASS GRAFT     Right lower extremity  . COLONOSCOPY WITH PROPOFOL N/A 07/18/2019   Procedure: COLONOSCOPY WITH PROPOFOL;  Surgeon: Lin Landsman, MD;  Location: [facility] ENDOSCOPY;  Service: Gastroenterology;  Laterality: N/A;  . CORONARY STENT INTERVENTION     x2 in [place].  Details unknown.  Marland Kitchen DIALYSIS/PERMA CATHETER INSERTION N/A 08/31/2019   Procedure: DIALYSIS/PERMA CATHETER INSERTION;  Surgeon: Algernon Huxley, MD;  Location: [facility];  Service: Cardiovascular;  Laterality: N/A;  . ESOPHAGOGASTRODUODENOSCOPY (EGD) WITH PROPOFOL N/A 07/18/2019   Procedure: ESOPHAGOGASTRODUODENOSCOPY (EGD) WITH PROPOFOL;  Surgeon: Lin Landsman, MD;  Location: [facility];  Service: Gastroenterology;  Laterality: N/A;  . REPLACEMENT TOTAL KNEE BILATERAL      Current Meds  Medication Sig  . amLODipine (NORVASC) 5 MG tablet Take 1 tablet (5 mg total) by mouth daily.  Marland Kitchen aspirin EC 81 MG tablet Take 1 tablet (81 mg total) by mouth daily.  Marland Kitchen atorvastatin (LIPITOR) 40 MG tablet Take 1 tablet (40 mg total) by mouth daily.  . carvedilol (COREG) 3.125 MG tablet Take 1 tablet (3.125 mg total) by mouth 2 (two) times daily with a meal.  . ferrous sulfate 220 (44 Fe) MG/5ML solution Take 220 mg by mouth daily.  . furosemide (LASIX) 80 MG tablet Take 1 tablet (80 mg total) by mouth daily.  Marland Kitchen levothyroxine (SYNTHROID) 50 MCG tablet Take 1 tablet (50 mcg total) by mouth daily at 6 (six) AM.  . multivitamin-iron-minerals-folic acid (CENTRUM) chewable tablet Chew 1 tablet by mouth daily.  Marland Kitchen  Nutritional Supplements (NEPRO) LIQD Take 237 mLs by mouth 2 (two) times daily between meals.,  . pantoprazole (PROTONIX) 40 MG tablet Take 1 tablet (40 mg total) by mouth daily.  . [DISCONTINUED] isosorbide mononitrate (ISMO) 10 MG tablet Take 1 tablet by mouth once daily    Allergies: Patient has no known  allergies.  Social History   Tobacco Use  . Smoking status: Former Smoker    Quit date: 09/06/1949    Years since quitting: 70.0  . Smokeless tobacco: Former Systems developer  . Tobacco comment: quit age 37, smoked from teenage years  Substance Use Topics  . Alcohol use: Not Currently  . Drug use: Never    Family History  Problem Relation Age of Onset  . Breast cancer Mother   . Breast cancer Daughter     Review of Systems: A 12-system review of systems was performed and was negative except as noted in the HPI.  --------------------------------------------------------------------------------------------------  Physical Exam: BP (!) 130/58 (BP Location: Left Arm, Patient Position: Sitting, Cuff Size: Normal)   Pulse (!) 55   Ht 6\' 2"  (1.88 m)   Wt 157 lb 2 oz (71.3 kg)   SpO2 (!) 80%   BMI 20.17 kg/m   SpO2 90% on 3L  General: NAD.  Accompanied by his daughter. Neck: No JVD or HJR.  Tunneled dialysis catheter in place. Lungs: Coarse breath sounds bilaterally without wheezes or crackles. Heart: Regular rate and rhythm with 1/6 systolic murmur.  No rubs or gallops. Extremities: No lower extremity edema.  EKG: Sinus bradycardia with bifascicular block (LAFB and RBBB) poor R wave progression, and possible inferior/posterior MI.  No significant change from prior tracing on 08/13/2019.  Lab Results  Component Value Date   WBC 5.1 07/27/2019   HGB 10.6 (L) 07/27/2019   HCT 31.8 (L) 07/27/2019   MCV 91 07/27/2019   PLT 218 07/27/2019    Lab Results  Component Value Date   NA 137 08/13/2019   K 4.7 08/13/2019   CL 105 08/13/2019   CO2 21 (L) 08/13/2019   BUN 88 (H) 08/13/2019   CREATININE 3.98 (H) 08/13/2019   GLUCOSE 97 08/13/2019   ALT 22 07/16/2019    Lab Results  Component Value Date   CHOL 187 07/08/2019   HDL 41 07/08/2019   LDLCALC 119 (H) 07/08/2019   TRIG 134 07/08/2019   CHOLHDL 4.6 07/08/2019    --------------------------------------------------------------------------------------------------  ASSESSMENT AND PLAN: Chronic systolic heart failure: Alex Larson appears euvolemic and well compensated with limited functional capacity.  We will continue carvedilol 3.125 mg twice daily, as resting bradycardia precludes further escalation.  I will transition his short acting isosorbide mononitrate to Imdur 30 mg daily.  We discussed adding additional medication for improvement of goal-directed medical therapy but have agreed to defer this in order to avoid hypotension with hemodialysis.  Once he has been established on hemodialysis for a few more weeks and we have a better understanding of how his blood pressure will respond, we may consider adding hydralazine or rechallenging him with an ARB now that he is on hemodialysis.  Coronary artery disease with stable angina: No symptoms to suggest worsening coronary insufficiency.  Continue aspirin, atorvastatin, and carvedilol for secondary prevention, as well as isosorbide mononitrate for antianginal therapy.  Alex Larson-stage renal disease: Continue hemodialysis per nephrology.  Chronic respiratory failure with hypoxia: Initial oxygen saturation noted to be 80% on 2 L.  However, patient normally wears 3 L at home.  The chart indicates that he  was evaluated on 4 L last year.  Oxygen saturation increased to 90% at rest on 3 L via nasal cannula.  I have recommended that Alex Larson use 4 L of supplemental oxygen.  I will defer further titration to Dr. Caryl Bis.  Follow-up: Return to clinic in 6 weeks.  Nelva Bush, MD 09/09/2019 2:31 PM

## 2019-09-09 NOTE — Patient Instructions (Signed)
Please make sure your oxygen tank is at 4 L.  Medication Instructions:  Your physician has recommended you make the following change in your medication:  1- STOP Isosorbide. 2- START Imdur 30 mg by mouth once a day.  *If you need a refill on your cardiac medications before your next appointment, please call your pharmacy*   Lab Work: none If you have labs (blood work) drawn today and your tests are completely normal, you will receive your results only by: Marland Kitchen MyChart Message (if you have MyChart) OR . A paper copy in the mail If you have any lab test that is abnormal or we need to change your treatment, we will call you to review the results.   Testing/Procedures: none   Follow-Up: At [facility], you and your health needs are our priority.  As part of our continuing mission to provide you with exceptional heart care, we have created designated Provider Care Teams.  These Care Teams include your primary Cardiologist (physician) and Advanced Practice Providers (APPs -  Physician Assistants and Nurse Practitioners) who all work together to provide you with the care you need, when you need it.  We recommend signing up for the patient portal called "MyChart".  Sign up information is provided on this After Visit Summary.  MyChart is used to connect with patients for Virtual Visits (Telemedicine).  Patients are able to view lab/test results, encounter notes, upcoming appointments, etc.  Non-urgent messages can be sent to your provider as well.   To learn more about what you can do with MyChart, go to [email or web address].    Your next appointment:   6 week(s)  The format for your next appointment:   In Person  Provider:    You may see Nelva Bush, MD or one of the following Advanced Practice Providers on your designated Care Team:    Murray Hodgkins, NP  Christell Faith, PA-C  Marrianne Mood, PA-C

## 2019-09-10 DIAGNOSIS — Z992 Dependence on renal dialysis: Secondary | ICD-10-CM | POA: Diagnosis not present

## 2019-09-10 DIAGNOSIS — N186 End stage renal disease: Secondary | ICD-10-CM | POA: Diagnosis not present

## 2019-09-10 DIAGNOSIS — N2581 Secondary hyperparathyroidism of renal origin: Secondary | ICD-10-CM | POA: Diagnosis not present

## 2019-09-11 ENCOUNTER — Inpatient Hospital Stay: Payer: Medicare PPO | Attending: Oncology | Admitting: Oncology

## 2019-09-11 ENCOUNTER — Other Ambulatory Visit: Payer: Self-pay

## 2019-09-11 ENCOUNTER — Inpatient Hospital Stay: Payer: Medicare PPO

## 2019-09-11 VITALS — BP 120/70 | HR 56 | Temp 96.1°F | Resp 18 | Wt 160.8 lb

## 2019-09-11 DIAGNOSIS — I132 Hypertensive heart and chronic kidney disease with heart failure and with stage 5 chronic kidney disease, or end stage renal disease: Secondary | ICD-10-CM | POA: Diagnosis not present

## 2019-09-11 DIAGNOSIS — N186 End stage renal disease: Secondary | ICD-10-CM | POA: Insufficient documentation

## 2019-09-11 DIAGNOSIS — I2510 Atherosclerotic heart disease of native coronary artery without angina pectoris: Secondary | ICD-10-CM | POA: Diagnosis not present

## 2019-09-11 DIAGNOSIS — Z992 Dependence on renal dialysis: Secondary | ICD-10-CM | POA: Diagnosis not present

## 2019-09-11 DIAGNOSIS — Z79899 Other long term (current) drug therapy: Secondary | ICD-10-CM | POA: Diagnosis not present

## 2019-09-11 DIAGNOSIS — Z87891 Personal history of nicotine dependence: Secondary | ICD-10-CM | POA: Insufficient documentation

## 2019-09-11 DIAGNOSIS — I739 Peripheral vascular disease, unspecified: Secondary | ICD-10-CM | POA: Diagnosis not present

## 2019-09-11 DIAGNOSIS — D631 Anemia in chronic kidney disease: Secondary | ICD-10-CM | POA: Diagnosis not present

## 2019-09-11 DIAGNOSIS — Z7982 Long term (current) use of aspirin: Secondary | ICD-10-CM | POA: Insufficient documentation

## 2019-09-11 DIAGNOSIS — I255 Ischemic cardiomyopathy: Secondary | ICD-10-CM | POA: Diagnosis not present

## 2019-09-11 DIAGNOSIS — D649 Anemia, unspecified: Secondary | ICD-10-CM

## 2019-09-11 LAB — CBC WITH DIFFERENTIAL/PLATELET
Abs Immature Granulocytes: 0.03 10*3/uL (ref 0.00–0.07)
Basophils Absolute: 0 10*3/uL (ref 0.0–0.1)
Basophils Relative: 1 %
Eosinophils Absolute: 0.1 10*3/uL (ref 0.0–0.5)
Eosinophils Relative: 2 %
HCT: 23.6 % — ABNORMAL LOW (ref 39.0–52.0)
Hemoglobin: 7.1 g/dL — ABNORMAL LOW (ref 13.0–17.0)
Immature Granulocytes: 1 %
Lymphocytes Relative: 13 %
Lymphs Abs: 0.7 10*3/uL (ref 0.7–4.0)
MCH: 29.1 pg (ref 26.0–34.0)
MCHC: 30.1 g/dL (ref 30.0–36.0)
MCV: 96.7 fL (ref 80.0–100.0)
Monocytes Absolute: 0.7 10*3/uL (ref 0.1–1.0)
Monocytes Relative: 12 %
Neutro Abs: 4.1 10*3/uL (ref 1.7–7.7)
Neutrophils Relative %: 71 %
Platelets: 167 10*3/uL (ref 150–400)
RBC: 2.44 MIL/uL — ABNORMAL LOW (ref 4.22–5.81)
RDW: 18.6 % — ABNORMAL HIGH (ref 11.5–15.5)
WBC: 5.6 10*3/uL (ref 4.0–10.5)
nRBC: 0 % (ref 0.0–0.2)

## 2019-09-11 LAB — TECHNOLOGIST SMEAR REVIEW
Plt Morphology: ADEQUATE
WBC Morphology: ABNORMAL

## 2019-09-11 LAB — COMPREHENSIVE METABOLIC PANEL
ALT: 17 U/L (ref 0–44)
AST: 26 U/L (ref 15–41)
Albumin: 3.5 g/dL (ref 3.5–5.0)
Alkaline Phosphatase: 83 U/L (ref 38–126)
Anion gap: 11 (ref 5–15)
BUN: 25 mg/dL — ABNORMAL HIGH (ref 8–23)
CO2: 31 mmol/L (ref 22–32)
Calcium: 9.5 mg/dL (ref 8.9–10.3)
Chloride: 99 mmol/L (ref 98–111)
Creatinine, Ser: 2.66 mg/dL — ABNORMAL HIGH (ref 0.61–1.24)
GFR calc Af Amer: 23 mL/min — ABNORMAL LOW (ref >60)
GFR calc non Af Amer: 20 mL/min — ABNORMAL LOW (ref >60)
Glucose, Bld: 121 mg/dL — ABNORMAL HIGH (ref 70–99)
Potassium: 4 mmol/L (ref 3.5–5.1)
Sodium: 141 mmol/L (ref 135–145)
Total Bilirubin: 0.8 mg/dL (ref 0.3–1.2)
Total Protein: 6.8 g/dL (ref 6.5–8.1)

## 2019-09-11 LAB — RETIC PANEL
Immature Retic Fract: 32.2 % — ABNORMAL HIGH (ref 2.3–15.9)
RBC.: 2.41 MIL/uL — ABNORMAL LOW (ref 4.22–5.81)
Retic Count, Absolute: 84.4 10*3/uL (ref 19.0–186.0)
Retic Ct Pct: 3.5 % — ABNORMAL HIGH (ref 0.4–3.1)
Reticulocyte Hemoglobin: 30.3 pg (ref >27.9)

## 2019-09-11 LAB — FERRITIN: Ferritin: 75 ng/mL (ref 24–336)

## 2019-09-11 LAB — IRON AND TIBC
Iron: 36 ug/dL — ABNORMAL LOW (ref 45–182)
Saturation Ratios: 13 % — ABNORMAL LOW (ref 17.9–39.5)
TIBC: 287 ug/dL (ref 250–450)
UIBC: 251 ug/dL

## 2019-09-11 LAB — FOLATE: Folate: 20.1 ng/mL (ref >5.9)

## 2019-09-11 NOTE — Progress Notes (Signed)
Hematology/Oncology Consult note [facility] [phone] [phone]   Patient Care Team: Leone Haven, MD as PCP - General (Family Medicine) End, Harrell Gave, MD as PCP - Cardiology (Cardiology)  REFERRING PROVIDER: Virgel Manifold, MD  CHIEF COMPLAINTS/REASON FOR VISIT:  Evaluation of iron deficiency anemia  HISTORY OF PRESENTING ILLNESS:  Alex Larson is a  86 y.o.  male with PMH listed below who was referred to me for evaluation of anemia Reviewed patient's recent labs that was done.  07/27/2019 labs revealed anemia with hemoglobin of 10.6, MCV 91, 07/18/2019 iron panel showed ferritin 120, saturation 18, TIBC 249. Reviewed patient's previous labs, anemia is chronic onset , duration is since May 2020 No aggravating or improving factors. Patient was hospitalized in February 2021 with acute anemia Associated signs and symptoms: Patient reports fatigue.  Denies SOB with exertion.  Denies weight loss, easy bruising, hematochezia, hemoptysis, hematuria. Context: History of GI bleeding: EGD colonoscopy and small bowel capsule study was done and was found to have angiectasia in the small bowel that were cauterized.                History of Chronic kidney disease: Patient has recently started on dialysis.                Patient was accompanied by daughter/caregiver today.  Denies any black or bloody stool. History of CHF, CAD, hypertension, ischemic cardiomyopathy, PAD. Review of Systems  Constitutional: Positive for fatigue. Negative for appetite change, chills, fever and unexpected weight change.  HENT:   Negative for hearing loss and voice change.   Eyes: Negative for eye problems and icterus.  Respiratory: Negative for chest tightness and cough.        Chronic shortness of breath, on home oxygen.  Cardiovascular: Negative for chest pain and leg swelling.  Gastrointestinal: Negative for abdominal distention and abdominal  pain.  Endocrine: Negative for hot flashes.  Genitourinary: Negative for difficulty urinating, dysuria and frequency.   Musculoskeletal: Negative for arthralgias.  Skin: Negative for itching and rash.  Neurological: Negative for light-headedness and numbness.  Hematological: Negative for adenopathy. Does not bruise/bleed easily.  Psychiatric/Behavioral: Negative for confusion.     MEDICAL HISTORY:  Past Medical History:  Diagnosis Date  . CAD (coronary artery disease)   . CHF (congestive heart failure) ([place])   . CKD (chronic kidney disease), stage IV ([place])   . HFrEF (heart failure with reduced ejection fraction) ([place])    a. 10/2018 Echo: EF 10-15%, DD. Diff HK. RVSP 70.4 mmHg. Mildly dil LA. Mod dil RA. Mod-sev TR. Ao sclerosis w/o stenosis.  . Hypertension   . Ischemic cardiomyopathy   . PAD (peripheral artery disease) ([name])    a. s/p RLE bypass.    SURGICAL HISTORY: Past Surgical History:  Procedure Laterality Date  . BYPASS GRAFT     Right lower extremity  . COLONOSCOPY WITH PROPOFOL N/A 07/18/2019   Procedure: COLONOSCOPY WITH PROPOFOL;  Surgeon: Lin Landsman, MD;  [facility] ENDOSCOPY;  Service: Gastroenterology;  Laterality: N/A;  . CORONARY STENT INTERVENTION     x2 in [place].  Details unknown.  Marland Kitchen DIALYSIS/PERMA CATHETER INSERTION N/A 08/31/2019   Procedure: DIALYSIS/PERMA CATHETER INSERTION;  Surgeon: Algernon Huxley, MD;  Location: [facility];  Service: Cardiovascular;  Laterality: N/A;  . ESOPHAGOGASTRODUODENOSCOPY (EGD) WITH PROPOFOL N/A 07/18/2019   Procedure: ESOPHAGOGASTRODUODENOSCOPY (EGD) WITH PROPOFOL;  Surgeon: Lin Landsman, MD;  Location: [facility];  Service: Gastroenterology;  Laterality: N/A;  .  REPLACEMENT TOTAL KNEE BILATERAL      SOCIAL HISTORY: Social History   Socioeconomic History  . Marital status: Widowed    Spouse name: Not on file  . Number of children: Not on file  . Years of education: Not on file  . Highest  education level: Not on file  Occupational History  . Occupation: retired  Tobacco Use  . Smoking status: Former Smoker    Quit date: 09/06/1949    Years since quitting: 70.0  . Smokeless tobacco: Former Systems developer  . Tobacco comment: quit age 57, smoked from teenage years  Substance and Sexual Activity  . Alcohol use: Not Currently  . Drug use: Never  . Sexual activity: Not Currently  Other Topics Concern  . Not on file  Social History Narrative  . Not on file   Social Determinants of Health   Financial Resource Strain: Low Risk   . Difficulty of Paying Living Expenses: Not hard at all  Food Insecurity: No Food Insecurity  . Worried About Charity fundraiser in the Last Year: Never true  . Ran Out of Food in the Last Year: Never true  Transportation Needs: No Transportation Needs  . Lack of Transportation (Medical): No  . Lack of Transportation (Non-Medical): No  Physical Activity: Sufficiently Active  . Days of Exercise per Week: 7 days  . Minutes of Exercise per Session: 30 min  Stress: No Stress Concern Present  . Feeling of Stress : Not at all  Social Connections: Moderately Isolated  . Frequency of Communication with Friends and Family: Not on file  . Frequency of Social Gatherings with Friends and Family: More than three times a week  . Attends Religious Services: Never  . Active Member of Clubs or Organizations: No  . Attends Archivist Meetings: Never  . Marital Status: Never married  Intimate Partner Violence: Not At Risk  . Fear of Current or Ex-Partner: No  . Emotionally Abused: No  . Physically Abused: No  . Sexually Abused: No    FAMILY HISTORY: Family History  Problem Relation Age of Onset  . Breast cancer Mother   . Breast cancer Daughter     ALLERGIES:  has No Known Allergies.  MEDICATIONS:  Current Outpatient Medications  Medication Sig Dispense Refill  . amLODipine (NORVASC) 5 MG tablet Take 1 tablet (5 mg total) by mouth daily. 90 tablet  3  . aspirin EC 81 MG tablet Take 1 tablet (81 mg total) by mouth daily. 90 tablet 3  . carvedilol (COREG) 3.125 MG tablet Take 1 tablet (3.125 mg total) by mouth 2 (two) times daily with a meal. 60 tablet 5  . ferrous sulfate 220 (44 Fe) MG/5ML solution Take 220 mg by mouth daily.    . furosemide (LASIX) 80 MG tablet Take 1 tablet (80 mg total) by mouth daily. 30 tablet 0  . isosorbide mononitrate (IMDUR) 30 MG 24 hr tablet Take 1 tablet (30 mg total) by mouth daily. 90 tablet 1  . levothyroxine (SYNTHROID) 50 MCG tablet Take 1 tablet (50 mcg total) by mouth daily at 6 (six) AM. 90 tablet 1  . multivitamin-iron-minerals-folic acid (CENTRUM) chewable tablet Chew 1 tablet by mouth daily.    . pantoprazole (PROTONIX) 40 MG tablet Take 1 tablet (40 mg total) by mouth daily. 90 tablet 1  . atorvastatin (LIPITOR) 40 MG tablet Take 1 tablet (40 mg total) by mouth daily. 90 tablet 3  . Nutritional Supplements (NEPRO) LIQD Take 237 mLs  by mouth 2 (two) times daily between meals., 1000 mL 6   No current facility-administered medications for this visit.     PHYSICAL EXAMINATION: ECOG PERFORMANCE STATUS: 2 - Symptomatic, <50% confined to bed Vitals:   09/11/19 1058  BP: 120/70  Pulse: (!) 56  Resp: 18  Temp: (!) 96.1 F (35.6 C)  SpO2: 94%   Filed Weights   09/11/19 1058  Weight: 160 lb 12.8 oz (72.9 kg)    Physical Exam Constitutional:      General: He is not in acute distress.    Comments: Frail appearance, he sits in the wheelchair.  HENT:     Head: Normocephalic and atraumatic.  Eyes:     General: No scleral icterus. Cardiovascular:     Rate and Rhythm: Normal rate and regular rhythm.     Heart sounds: Normal heart sounds.  Pulmonary:     Effort: Pulmonary effort is normal. No respiratory distress.     Breath sounds: No wheezing.     Comments: Decreased breath sound bilaterally Abdominal:     General: Bowel sounds are normal. There is no distension.     Palpations: Abdomen  is soft.  Musculoskeletal:        General: No deformity. Normal range of motion.     Cervical back: Normal range of motion and neck supple.  Skin:    General: Skin is warm and dry.     Findings: No erythema or rash.  Neurological:     Mental Status: He is alert and oriented to person, place, and time. Mental status is at baseline.     Cranial Nerves: No cranial nerve deficit.     Coordination: Coordination normal.  Psychiatric:        Mood and Affect: Mood normal.      LABORATORY DATA:  I have reviewed the data as listed Lab Results  Component Value Date   WBC 5.6 09/11/2019   HGB 7.1 (L) 09/11/2019   HCT 23.6 (L) 09/11/2019   MCV 96.7 09/11/2019   PLT 167 09/11/2019   Recent Labs    11/02/18 0554 07/08/19 1147 07/16/19 2307 07/17/19 0219 07/27/19 1110 08/13/19 1232 09/11/19 1154  NA   < >  --  140   < > 142 137 141  K   < >  --  4.7   < > 4.3 4.7 4.0  CL   < >  --  108   < > 105 105 99  CO2   < >  --  18*   < > 20 21* 31  GLUCOSE   < >  --  134*   < > 89 97 121*  BUN   < >  --  87*   < > 62* 88* 25*  CREATININE   < >  --  4.68*   < > 5.23* 3.98* 2.66*  CALCIUM   < >  --  10.3   < > 10.1 10.6* 9.5  GFRNONAA   < >  --  10*   < > 9* 12* 20*  GFRAA   < >  --  12*   < > 10* 14* 23*  PROT  --  6.7 7.4  --   --   --  6.8  ALBUMIN  --  3.4* 3.6  --   --   --  3.5  AST  --  27 45*  --   --   --  26  ALT  --  12 22  --   --   --  17  ALKPHOS  --  47 67  --   --   --  83  BILITOT  --  0.9 0.9  --   --   --  0.8  BILIDIR  --  <0.1  --   --   --   --   --   IBILI  --  NOT CALCULATED  --   --   --   --   --    < > = values in this interval not displayed.   Iron/TIBC/Ferritin/ %Sat    Component Value Date/Time   IRON 36 (L) 09/11/2019 1154   TIBC 287 09/11/2019 1154   FERRITIN 75 09/11/2019 1154   IRONPCTSAT 13 (L) 09/11/2019 1154        ASSESSMENT & PLAN:  1. Normocytic anemia   2. CKD (chronic kidney disease) requiring chronic dialysis [facility])    Labs are  reviewed and discussed with patient. Normocytic anemia, Multifactorial, from recent blood loss as well as anemia secondary to chronic kidney disease. His blood work has been more than 42 month old, I recommend to repeat CBC, check smear, folate, CMP, reticulocyte panel, multiple myeloma panel, light chain ratio, ferritin, iron TIBC   We discussed about the possibility of need of IV iron infusion.Marland Kitchendoses. Allergy reactions/infusion reaction including anaphylactic reaction discussed with patient. Other side effects include but not limited to high blood pressure, skin rash, weight gain, leg swelling, etc. Patient voices understanding and willing to proceed if needed. We also discussed about erythropoietin replacement therapy if he has adequate iron stores.  Iron labs are reviewed.  Decreased saturation ratio to 13, ferritin 75.  In the context of chronic kidney disease, I recommend to further increase iron store.  Patient will need future erythropoietin replacement.  Myeloma work-up is negative.  Smear also showed some basophilic stippling, polychromasia and a few teardrop cells/rare schistocytes noted.  Normal platelet level.  Underlying MDS cannot be ruled out.- with his advanced age and multiple medical problems, recommend no further work up at this point.   I discussed with patient's nephrologist. Since patient has been started on hemodialysis, nephrology will be in charge of his anemia treatment with IV iron and EPO. Patient will not need to follow up with me.   Orders Placed This Encounter  Procedures  . Iron and TIBC    Standing Status:   Future    Number of Occurrences:   1    Standing Expiration Date:   03/12/2021  . Ferritin    Standing Status:   Future    Number of Occurrences:   1    Standing Expiration Date:   03/12/2021  . Multiple Myeloma Panel (SPEP&IFE w/QIG)    Standing Status:   Future    Number of Occurrences:   1    Standing Expiration Date:   03/12/2021  . Kappa/lambda  light chains    Standing Status:   Future    Number of Occurrences:   1    Standing Expiration Date:   03/12/2021  . Retic Panel    Standing Status:   Future    Number of Occurrences:   1    Standing Expiration Date:   03/12/2021  . CBC with Differential/Platelet    Standing Status:   Future    Number of Occurrences:   1    Standing Expiration Date:   03/12/2021  . Comprehensive metabolic panel  Standing Status:   Future    Number of Occurrences:   1    Standing Expiration Date:   03/12/2021  . Technologist smear review    Standing Status:   Future    Number of Occurrences:   1    Standing Expiration Date:   03/12/2021  . Folate    Standing Status:   Future    Number of Occurrences:   1    Standing Expiration Date:   03/12/2021    All questions were answered. The patient knows to call the clinic with any problems questions or concerns. Cc. Virgel Manifold, MD  Thank you for this kind referral and the opportunity to participate in the care of this patient. A copy of today's note is routed to referring provider  Earlie Server, MD, PhD 09/11/2019

## 2019-09-12 DIAGNOSIS — Z992 Dependence on renal dialysis: Secondary | ICD-10-CM | POA: Diagnosis not present

## 2019-09-12 DIAGNOSIS — N186 End stage renal disease: Secondary | ICD-10-CM | POA: Diagnosis not present

## 2019-09-12 DIAGNOSIS — N2581 Secondary hyperparathyroidism of renal origin: Secondary | ICD-10-CM | POA: Diagnosis not present

## 2019-09-14 LAB — KAPPA/LAMBDA LIGHT CHAINS
Kappa free light chain: 142.1 mg/L — ABNORMAL HIGH (ref 3.3–19.4)
Kappa, lambda light chain ratio: 1.65 (ref 0.26–1.65)
Lambda free light chains: 86.2 mg/L — ABNORMAL HIGH (ref 5.7–26.3)

## 2019-09-15 DIAGNOSIS — N2581 Secondary hyperparathyroidism of renal origin: Secondary | ICD-10-CM | POA: Diagnosis not present

## 2019-09-15 DIAGNOSIS — N186 End stage renal disease: Secondary | ICD-10-CM | POA: Diagnosis not present

## 2019-09-15 DIAGNOSIS — Z992 Dependence on renal dialysis: Secondary | ICD-10-CM | POA: Diagnosis not present

## 2019-09-16 LAB — MULTIPLE MYELOMA PANEL, SERUM
Albumin SerPl Elph-Mcnc: 3.5 g/dL (ref 2.9–4.4)
Albumin/Glob SerPl: 1.3 (ref 0.7–1.7)
Alpha 1: 0.3 g/dL (ref 0.0–0.4)
Alpha2 Glob SerPl Elph-Mcnc: 0.5 g/dL (ref 0.4–1.0)
B-Globulin SerPl Elph-Mcnc: 0.9 g/dL (ref 0.7–1.3)
Gamma Glob SerPl Elph-Mcnc: 1.1 g/dL (ref 0.4–1.8)
Globulin, Total: 2.8 g/dL (ref 2.2–3.9)
IgA: 407 mg/dL (ref 61–437)
IgG (Immunoglobin G), Serum: 1199 mg/dL (ref 603–1613)
IgM (Immunoglobulin M), Srm: 43 mg/dL (ref 15–143)
Total Protein ELP: 6.3 g/dL (ref 6.0–8.5)

## 2019-09-17 DIAGNOSIS — Z992 Dependence on renal dialysis: Secondary | ICD-10-CM | POA: Diagnosis not present

## 2019-09-17 DIAGNOSIS — N2581 Secondary hyperparathyroidism of renal origin: Secondary | ICD-10-CM | POA: Diagnosis not present

## 2019-09-17 DIAGNOSIS — N186 End stage renal disease: Secondary | ICD-10-CM | POA: Diagnosis not present

## 2019-09-18 ENCOUNTER — Ambulatory Visit: Payer: Medicare PPO | Admitting: Internal Medicine

## 2019-09-18 ENCOUNTER — Telehealth: Payer: Self-pay

## 2019-09-18 NOTE — Telephone Encounter (Signed)
-----   Message from Earlie Server, MD sent at 09/18/2019  8:05 AM EDT ----- Please let patient/family know that his anemia is mainly due to kidney failure and his nephrologist will take care of the anemia treatments. No need to follow up at this point.

## 2019-09-18 NOTE — Telephone Encounter (Signed)
Patients daughter is aware

## 2019-09-19 DIAGNOSIS — N2581 Secondary hyperparathyroidism of renal origin: Secondary | ICD-10-CM | POA: Diagnosis not present

## 2019-09-19 DIAGNOSIS — N186 End stage renal disease: Secondary | ICD-10-CM | POA: Diagnosis not present

## 2019-09-19 DIAGNOSIS — Z992 Dependence on renal dialysis: Secondary | ICD-10-CM | POA: Diagnosis not present

## 2019-09-21 ENCOUNTER — Other Ambulatory Visit: Payer: Self-pay

## 2019-09-21 ENCOUNTER — Ambulatory Visit (INDEPENDENT_AMBULATORY_CARE_PROVIDER_SITE_OTHER): Payer: Medicare PPO | Admitting: Nurse Practitioner

## 2019-09-21 ENCOUNTER — Encounter (INDEPENDENT_AMBULATORY_CARE_PROVIDER_SITE_OTHER): Payer: Self-pay | Admitting: Nurse Practitioner

## 2019-09-21 VITALS — BP 130/66 | HR 77 | Ht 74.0 in | Wt 161.0 lb

## 2019-09-21 DIAGNOSIS — N185 Chronic kidney disease, stage 5: Secondary | ICD-10-CM

## 2019-09-21 DIAGNOSIS — I10 Essential (primary) hypertension: Secondary | ICD-10-CM

## 2019-09-21 NOTE — Progress Notes (Signed)
Subjective:    Patient ID: Alex Larson, male    DOB: [birth year], 86 y.o.   MRN: [id number] Chief Complaint  Patient presents with  . Follow-up    3 week [facility] Post perm cath insertion    Patient follows up today after PermCath insertion on 08/31/2019.  Following insertion he has not had any issues.  Patient is also begun on hemodialysis.  He is adjusting well.  Other than some fatigue he is handling it without issue.  He denies any chest pain, shortness of breath fever or chills.  He denies any TIA or amaurosis fugax-like symptoms.   Review of Systems  Respiratory:       Home oxygen  Musculoskeletal: Positive for gait problem.  Neurological: Positive for weakness.  All other systems reviewed and are negative.      Objective:   Physical Exam Vitals reviewed. Exam conducted with a chaperone present (Daughter present).  HENT:     Head: Normocephalic.  Cardiovascular:     Rate and Rhythm: Normal rate and regular rhythm.     Pulses:          Radial pulses are 2+ on the right side and 2+ on the left side.     Comments: PermCath right chest Pulmonary:     Effort: Pulmonary effort is normal.     Breath sounds: Normal breath sounds.     Comments: Home oxygen Skin:    General: Skin is warm.  Neurological:     General: No focal deficit present.     Mental Status: He is alert. Mental status is at baseline.  Psychiatric:        Mood and Affect: Mood normal.        Behavior: Behavior normal.        Thought Content: Thought content normal.        Judgment: Judgment normal.     BP 130/66   Pulse 77   Ht 6\' 2"  (1.88 m)   Wt 161 lb (73 kg)   BMI 20.67 kg/m   Past Medical History:  Diagnosis Date  . CAD (coronary artery disease)   . CHF (congestive heart failure) ([place])   . CKD (chronic kidney disease), stage IV ([place])   . HFrEF (heart failure with reduced ejection fraction) ([place])    a. 10/2018 Echo: EF 10-15%, DD. Diff HK. RVSP 70.4 mmHg. Mildly dil LA. Mod dil RA. Mod-sev  TR. Ao sclerosis w/o stenosis.  . Hypertension   . Ischemic cardiomyopathy   . PAD (peripheral artery disease) ([name])    a. s/p RLE bypass.    Social History   Socioeconomic History  . Marital status: Widowed    Spouse name: Not on file  . Number of children: Not on file  . Years of education: Not on file  . Highest education level: Not on file  Occupational History  . Occupation: retired  Tobacco Use  . Smoking status: Former Smoker    Quit date: 09/06/1949    Years since quitting: 70.0  . Smokeless tobacco: Former Systems developer  . Tobacco comment: quit age 8, smoked from teenage years  Substance and Sexual Activity  . Alcohol use: Not Currently  . Drug use: Never  . Sexual activity: Not Currently  Other Topics Concern  . Not on file  Social History Narrative  . Not on file   Social Determinants of Health   Financial Resource Strain: Low Risk   . Difficulty of Paying Living Expenses: Not hard at  all  Food Insecurity: No Food Insecurity  . Worried About Charity fundraiser in the Last Year: Never true  . Ran Out of Food in the Last Year: Never true  Transportation Needs: No Transportation Needs  . Lack of Transportation (Medical): No  . Lack of Transportation (Non-Medical): No  Physical Activity: Sufficiently Active  . Days of Exercise per Week: 7 days  . Minutes of Exercise per Session: 30 min  Stress: No Stress Concern Present  . Feeling of Stress : Not at all  Social Connections: Moderately Isolated  . Frequency of Communication with Friends and Family: Not on file  . Frequency of Social Gatherings with Friends and Family: More than three times a week  . Attends Religious Services: Never  . Active Member of Clubs or Organizations: No  . Attends Archivist Meetings: Never  . Marital Status: Never married  Intimate Partner Violence: Not At Risk  . Fear of Current or Ex-Partner: No  . Emotionally Abused: No  . Physically Abused: No  . Sexually Abused: No     Past Surgical History:  Procedure Laterality Date  . BYPASS GRAFT     Right lower extremity  . COLONOSCOPY WITH PROPOFOL N/A 07/18/2019   Procedure: COLONOSCOPY WITH PROPOFOL;  Surgeon: Lin Landsman, MD;  Location: [facility] ENDOSCOPY;  Service: Gastroenterology;  Laterality: N/A;  . CORONARY STENT INTERVENTION     x2 in [place].  Details unknown.  Marland Kitchen DIALYSIS/PERMA CATHETER INSERTION N/A 08/31/2019   Procedure: DIALYSIS/PERMA CATHETER INSERTION;  Surgeon: Algernon Huxley, MD;  Location: [place] [facility];  Service: Cardiovascular;  Laterality: N/A;  . ESOPHAGOGASTRODUODENOSCOPY (EGD) WITH PROPOFOL N/A 07/18/2019   Procedure: ESOPHAGOGASTRODUODENOSCOPY (EGD) WITH PROPOFOL;  Surgeon: Lin Landsman, MD;  Location: [place];  Service: Gastroenterology;  Laterality: N/A;  . REPLACEMENT TOTAL KNEE BILATERAL      Family History  Problem Relation Age of Onset  . Breast cancer Mother   . Breast cancer Daughter     No Known Allergies     Assessment & Plan:   1. Chronic kidney disease (CKD), stage V ([place]) Overall patient is doing well following PermCath insertion.  Based upon previous discussion notes with Dr. Lucky Cowboy the patient has very small veins for possible fistula creation.  The patient also has occluded radial and ulnar arteries in each arm.  In addition, the patient has extensive PAD with previous bypass.  Patient also has a known 10 to 15% EF.  Undergoing surgery for fistula placement would be risky but a possible permanent access that would place would also be at high risk for steal syndrome.  In either case it would likely be in the patient's best and safest interest to be catheter dependent.  Care of catheter will be maintained via dialysis center.  If it becomes occluded or needs exchange dialysis center will contact our office.  Discussed with patient and family signs and symptoms of infection which would require urgent attention.  Patient will follow up on an as-needed  basis  2. Essential hypertension Continue antihypertensive medications as already ordered, these medications have been reviewed and there are no changes at this time.    Current Outpatient Medications on File Prior to Visit  Medication Sig Dispense Refill  . amLODipine (NORVASC) 5 MG tablet Take 1 tablet (5 mg total) by mouth daily. 90 tablet 3  . aspirin EC 81 MG tablet Take 1 tablet (81 mg total) by mouth daily. 90 tablet 3  .  carvedilol (COREG) 3.125 MG tablet Take 1 tablet (3.125 mg total) by mouth 2 (two) times daily with a meal. 60 tablet 5  . ferrous sulfate 220 (44 Fe) MG/5ML solution Take 220 mg by mouth daily.    . furosemide (LASIX) 80 MG tablet Take 1 tablet (80 mg total) by mouth daily. 30 tablet 0  . isosorbide mononitrate (IMDUR) 30 MG 24 hr tablet Take 1 tablet (30 mg total) by mouth daily. 90 tablet 1  . levothyroxine (SYNTHROID) 50 MCG tablet Take 1 tablet (50 mcg total) by mouth daily at 6 (six) AM. 90 tablet 1  . multivitamin-iron-minerals-folic acid (CENTRUM) chewable tablet Chew 1 tablet by mouth daily.    . Nutritional Supplements (NEPRO) LIQD Take 237 mLs by mouth 2 (two) times daily between meals., 1000 mL 6  . pantoprazole (PROTONIX) 40 MG tablet Take 1 tablet (40 mg total) by mouth daily. 90 tablet 1  . atorvastatin (LIPITOR) 40 MG tablet Take 1 tablet (40 mg total) by mouth daily. 90 tablet 3   No current facility-administered medications on file prior to visit.    There are no Patient Instructions on file for this visit. No follow-ups on file.   Kris Hartmann, NP

## 2019-09-22 DIAGNOSIS — N186 End stage renal disease: Secondary | ICD-10-CM | POA: Diagnosis not present

## 2019-09-22 DIAGNOSIS — N2581 Secondary hyperparathyroidism of renal origin: Secondary | ICD-10-CM | POA: Diagnosis not present

## 2019-09-22 DIAGNOSIS — Z992 Dependence on renal dialysis: Secondary | ICD-10-CM | POA: Diagnosis not present

## 2019-09-24 DIAGNOSIS — N2581 Secondary hyperparathyroidism of renal origin: Secondary | ICD-10-CM | POA: Diagnosis not present

## 2019-09-24 DIAGNOSIS — N186 End stage renal disease: Secondary | ICD-10-CM | POA: Diagnosis not present

## 2019-09-24 DIAGNOSIS — Z992 Dependence on renal dialysis: Secondary | ICD-10-CM | POA: Diagnosis not present

## 2019-09-26 DIAGNOSIS — N2581 Secondary hyperparathyroidism of renal origin: Secondary | ICD-10-CM | POA: Diagnosis not present

## 2019-09-26 DIAGNOSIS — Z992 Dependence on renal dialysis: Secondary | ICD-10-CM | POA: Diagnosis not present

## 2019-09-26 DIAGNOSIS — N186 End stage renal disease: Secondary | ICD-10-CM | POA: Diagnosis not present

## 2019-09-27 ENCOUNTER — Other Ambulatory Visit: Payer: Self-pay | Admitting: Family

## 2019-09-29 DIAGNOSIS — N186 End stage renal disease: Secondary | ICD-10-CM | POA: Diagnosis not present

## 2019-09-29 DIAGNOSIS — Z992 Dependence on renal dialysis: Secondary | ICD-10-CM | POA: Diagnosis not present

## 2019-09-29 DIAGNOSIS — N2581 Secondary hyperparathyroidism of renal origin: Secondary | ICD-10-CM | POA: Diagnosis not present

## 2019-10-01 DIAGNOSIS — N2581 Secondary hyperparathyroidism of renal origin: Secondary | ICD-10-CM | POA: Diagnosis not present

## 2019-10-01 DIAGNOSIS — N186 End stage renal disease: Secondary | ICD-10-CM | POA: Diagnosis not present

## 2019-10-01 DIAGNOSIS — Z992 Dependence on renal dialysis: Secondary | ICD-10-CM | POA: Diagnosis not present

## 2019-10-02 DIAGNOSIS — Z992 Dependence on renal dialysis: Secondary | ICD-10-CM | POA: Diagnosis not present

## 2019-10-02 DIAGNOSIS — N186 End stage renal disease: Secondary | ICD-10-CM | POA: Diagnosis not present

## 2019-10-03 DIAGNOSIS — J9611 Chronic respiratory failure with hypoxia: Secondary | ICD-10-CM | POA: Diagnosis not present

## 2019-10-03 DIAGNOSIS — Z992 Dependence on renal dialysis: Secondary | ICD-10-CM | POA: Diagnosis not present

## 2019-10-03 DIAGNOSIS — N2581 Secondary hyperparathyroidism of renal origin: Secondary | ICD-10-CM | POA: Diagnosis not present

## 2019-10-03 DIAGNOSIS — N186 End stage renal disease: Secondary | ICD-10-CM | POA: Diagnosis not present

## 2019-10-06 ENCOUNTER — Telehealth: Payer: Self-pay | Admitting: Family Medicine

## 2019-10-06 DIAGNOSIS — Z992 Dependence on renal dialysis: Secondary | ICD-10-CM | POA: Diagnosis not present

## 2019-10-06 DIAGNOSIS — N186 End stage renal disease: Secondary | ICD-10-CM | POA: Diagnosis not present

## 2019-10-06 DIAGNOSIS — N2581 Secondary hyperparathyroidism of renal origin: Secondary | ICD-10-CM | POA: Diagnosis not present

## 2019-10-06 NOTE — Telephone Encounter (Signed)
Pt's daughter called and she would like to get a portable oxygen tank for him. Please advise

## 2019-10-06 NOTE — Telephone Encounter (Signed)
I called and spoke with the patient's daughter and informed her that the patient can only get a portable tank from [name] but they do not have the portable ones and that if she goes with another company before her 5 years of use of the tank they will have to pay out of pocket. She understood.  Saladin Petrelli,cma

## 2019-10-08 DIAGNOSIS — N186 End stage renal disease: Secondary | ICD-10-CM | POA: Diagnosis not present

## 2019-10-08 DIAGNOSIS — Z992 Dependence on renal dialysis: Secondary | ICD-10-CM | POA: Diagnosis not present

## 2019-10-08 DIAGNOSIS — N2581 Secondary hyperparathyroidism of renal origin: Secondary | ICD-10-CM | POA: Diagnosis not present

## 2019-10-10 DIAGNOSIS — N186 End stage renal disease: Secondary | ICD-10-CM | POA: Diagnosis not present

## 2019-10-10 DIAGNOSIS — Z992 Dependence on renal dialysis: Secondary | ICD-10-CM | POA: Diagnosis not present

## 2019-10-10 DIAGNOSIS — N2581 Secondary hyperparathyroidism of renal origin: Secondary | ICD-10-CM | POA: Diagnosis not present

## 2019-10-12 ENCOUNTER — Telehealth: Payer: Self-pay

## 2019-10-12 NOTE — Telephone Encounter (Signed)
I called the patient's daughter and made her aware that the patient's Nepro supplement drinks are here and she can pick them up at the front desk, she understood.  Ever Halberg,cma

## 2019-10-13 DIAGNOSIS — Z992 Dependence on renal dialysis: Secondary | ICD-10-CM | POA: Diagnosis not present

## 2019-10-13 DIAGNOSIS — N186 End stage renal disease: Secondary | ICD-10-CM | POA: Diagnosis not present

## 2019-10-13 DIAGNOSIS — N2581 Secondary hyperparathyroidism of renal origin: Secondary | ICD-10-CM | POA: Diagnosis not present

## 2019-10-15 DIAGNOSIS — N186 End stage renal disease: Secondary | ICD-10-CM | POA: Diagnosis not present

## 2019-10-15 DIAGNOSIS — N2581 Secondary hyperparathyroidism of renal origin: Secondary | ICD-10-CM | POA: Diagnosis not present

## 2019-10-15 DIAGNOSIS — Z992 Dependence on renal dialysis: Secondary | ICD-10-CM | POA: Diagnosis not present

## 2019-10-17 DIAGNOSIS — N186 End stage renal disease: Secondary | ICD-10-CM | POA: Diagnosis not present

## 2019-10-17 DIAGNOSIS — N2581 Secondary hyperparathyroidism of renal origin: Secondary | ICD-10-CM | POA: Diagnosis not present

## 2019-10-17 DIAGNOSIS — Z992 Dependence on renal dialysis: Secondary | ICD-10-CM | POA: Diagnosis not present

## 2019-10-20 DIAGNOSIS — Z992 Dependence on renal dialysis: Secondary | ICD-10-CM | POA: Diagnosis not present

## 2019-10-20 DIAGNOSIS — N186 End stage renal disease: Secondary | ICD-10-CM | POA: Diagnosis not present

## 2019-10-20 DIAGNOSIS — N2581 Secondary hyperparathyroidism of renal origin: Secondary | ICD-10-CM | POA: Diagnosis not present

## 2019-10-21 ENCOUNTER — Other Ambulatory Visit: Payer: Self-pay

## 2019-10-21 ENCOUNTER — Ambulatory Visit (INDEPENDENT_AMBULATORY_CARE_PROVIDER_SITE_OTHER): Payer: Medicare PPO | Admitting: Internal Medicine

## 2019-10-21 VITALS — BP 104/60 | HR 62 | Ht 74.0 in | Wt 152.1 lb

## 2019-10-21 DIAGNOSIS — I255 Ischemic cardiomyopathy: Secondary | ICD-10-CM

## 2019-10-21 DIAGNOSIS — I10 Essential (primary) hypertension: Secondary | ICD-10-CM

## 2019-10-21 DIAGNOSIS — N186 End stage renal disease: Secondary | ICD-10-CM | POA: Diagnosis not present

## 2019-10-21 DIAGNOSIS — I5022 Chronic systolic (congestive) heart failure: Secondary | ICD-10-CM | POA: Diagnosis not present

## 2019-10-21 DIAGNOSIS — I2510 Atherosclerotic heart disease of native coronary artery without angina pectoris: Secondary | ICD-10-CM | POA: Diagnosis not present

## 2019-10-21 MED ORDER — AMLODIPINE BESYLATE 2.5 MG PO TABS: 2.5000 mg | ORAL_TABLET | Freq: Every day | ORAL | 1 refills | Status: DC

## 2019-10-21 NOTE — Progress Notes (Signed)
Follow-up Outpatient Visit Date: 10/21/2019  Primary Care Provider: Leone Haven, MD [address] [place] [address]  Chief Complaint: Follow-up CAD and heart failure  HPI:  Alex Larson is a 86 y.o. male with history of coronary artery disease status post PCI x2 (details unknown), chronicHFrEF, end-stage renal disease, PAD status post RLE bypass, chronic respiratory failure with hypoxia, and hypertension, who presents for follow-up of CAD and chronic HFrEF.  I last saw him in early April, at which time he was doing well without chest pain, dyspnea, palpitations, lightheadedness, and edema.  He had tolerated his first 3 hemodialysis sessions well.  We agreed to transition to long-acting isosorbide mononitrate 30 mg daily; we did not make any additional changes to his evidence-based heart failure therapy in order to prevent hypotension with dialysis.  Mr. Capizzi reports that he has been doing relatively well.  Blood pressures have been somewhat low at times with hemodialysis, prompting recommendation by nephrology for Mr. Carmie End to hold his a.m. medications on hemodialysis days.  He denies chest pain, shortness of breath, palpitations, lightheadedness, and edema.  Supplemental oxygen has been increased from 3 L to 4 L via nasal cannula since our last visit.  --------------------------------------------------------------------------------------------------  Past Medical History:  Diagnosis Date  . CAD (coronary artery disease)   . CHF (congestive heart failure) ([place])   . CKD (chronic kidney disease), stage IV ([place])   . HFrEF (heart failure with reduced ejection fraction) ([place])    a. 10/2018 Echo: EF 10-15%, DD. Diff HK. RVSP 70.4 mmHg. Mildly dil LA. Mod dil RA. Mod-sev TR. Ao sclerosis w/o stenosis.  . Hypertension   . Ischemic cardiomyopathy   . PAD (peripheral artery disease) ([name])    a. s/p RLE bypass.   Past Surgical History:  Procedure Laterality Date  .  BYPASS GRAFT     Right lower extremity  . COLONOSCOPY WITH PROPOFOL N/A 07/18/2019   Procedure: COLONOSCOPY WITH PROPOFOL;  Surgeon: Lin Landsman, MD;  Location: [facility] ENDOSCOPY;  Service: Gastroenterology;  Laterality: N/A;  . CORONARY STENT INTERVENTION     x2 in [place].  Details unknown.  Marland Kitchen DIALYSIS/PERMA CATHETER INSERTION N/A 08/31/2019   Procedure: DIALYSIS/PERMA CATHETER INSERTION;  Surgeon: Algernon Huxley, MD;  Location: [place] [facility];  Service: Cardiovascular;  Laterality: N/A;  . ESOPHAGOGASTRODUODENOSCOPY (EGD) WITH PROPOFOL N/A 07/18/2019   Procedure: ESOPHAGOGASTRODUODENOSCOPY (EGD) WITH PROPOFOL;  Surgeon: Lin Landsman, MD;  Location: [place];  Service: Gastroenterology;  Laterality: N/A;  . REPLACEMENT TOTAL KNEE BILATERAL      Current Meds  Medication Sig  . aspirin EC 81 MG tablet Take 1 tablet (81 mg total) by mouth daily.  Marland Kitchen atorvastatin (LIPITOR) 40 MG tablet Take 1 tablet (40 mg total) by mouth daily.  Marland Kitchen b complex-vitamin c-folic acid (NEPHRO-VITE) 0.8 MG TABS tablet Take 1 tablet by mouth daily.  . carvedilol (COREG) 3.125 MG tablet TAKE 1 TABLET BY MOUTH TWICE DAILY WITH MEALS  . ferrous sulfate 220 (44 Fe) MG/5ML solution Take 220 mg by mouth daily.  . furosemide (LASIX) 80 MG tablet Take 1 tablet (80 mg total) by mouth daily.  . isosorbide mononitrate (IMDUR) 30 MG 24 hr tablet Take 1 tablet (30 mg total) by mouth daily.  Marland Kitchen levothyroxine (SYNTHROID) 50 MCG tablet Take 1 tablet (50 mcg total) by mouth daily at 6 (six) AM.  . Nutritional Supplements (NEPRO) LIQD Take 237 mLs by mouth 2 (two) times daily between meals.,  .  pantoprazole (PROTONIX) 40 MG tablet Take 1 tablet (40 mg total) by mouth daily.  . [DISCONTINUED] amLODipine (NORVASC) 5 MG tablet Take 1 tablet (5 mg total) by mouth daily.    Allergies: Patient has no known allergies.  Social History   Tobacco Use  . Smoking status: Former Smoker    Quit date: 09/06/1949    Years  since quitting: 70.1  . Smokeless tobacco: Former Systems developer  . Tobacco comment: quit age 49, smoked from teenage years  Substance Use Topics  . Alcohol use: Not Currently  . Drug use: Never    Family History  Problem Relation Age of Onset  . Breast cancer Mother   . Breast cancer Daughter     Review of Systems: A 12-system review of systems was performed and was negative except as noted in the HPI.  --------------------------------------------------------------------------------------------------  Physical Exam: BP 104/60 (BP Location: Left Arm, Patient Position: Sitting, Cuff Size: Normal)   Pulse 62   Ht 6\' 2"  (1.88 m)   Wt 152 lb 2 oz (69 kg)   SpO2 97% Comment: on 4LO2  BMI 19.53 kg/m   General: NAD.  Accompanied by his daughter. Neck: No JVD or HJR.  Tunneled central venous catheter noted in the right chest. Lungs: Clear to auscultation without wheezes or crackles. Heart: Distant heart sounds.  Regular rate and rhythm without murmurs, rubs, or gallops. Abdomen: Soft, nontender, nondistended. Extremities: No lower extremity edema.  EKG: Normal sinus rhythm with PACs, bifascicular block (LAFB and RBBB), poor R wave progression, and possible inferior MI.  No significant change from prior tracing on 09/09/2019.  Lab Results  Component Value Date   WBC 5.6 09/11/2019   HGB 7.1 (L) 09/11/2019   HCT 23.6 (L) 09/11/2019   MCV 96.7 09/11/2019   PLT 167 09/11/2019    Lab Results  Component Value Date   NA 141 09/11/2019   K 4.0 09/11/2019   CL 99 09/11/2019   CO2 31 09/11/2019   BUN 25 (H) 09/11/2019   CREATININE 2.66 (H) 09/11/2019   GLUCOSE 121 (H) 09/11/2019   ALT 17 09/11/2019    Lab Results  Component Value Date   CHOL 187 07/08/2019   HDL 41 07/08/2019   LDLCALC 119 (H) 07/08/2019   TRIG 134 07/08/2019   CHOLHDL 4.6 07/08/2019    --------------------------------------------------------------------------------------------------  ASSESSMENT AND PLAN:  Chronic HFrEF due to ischemic cardiomyopathy: Mr. Fouch appears relatively stable.  Functional status is limited but unchanged from prior visits.  His only evidence-based heart failure therapy at this time is low-dose carvedilol.  We have agreed to decrease amlodipine to 2.5 mg daily and reassess his blood pressure at follow-up.  At that time, if blood pressure tolerates, we could consider discontinuing amlodipine altogether and adding hydralazine to existing isosorbide mononitrate.  Defer ACE inhibitor/ARB/ARNI, and aldosterone antagonist in the setting of ESRD and soft blood pressure.  Coronary artery disease: No signs or symptoms to suggest worsening coronary insufficiency.  Continue aspirin, atorvastatin, and carvedilol for secondary prevention.  End-stage renal disease: Volume status appears stable.  Ongoing dialysis per nephrology.  Hypertension: Blood pressure overall remaining borderline low, albeit asymptomatic.  I hope to wean him off amlodipine in order to facilitate optimization of goal-directed medical therapy for his heart failure, as above.  Follow-up: Return to clinic in 2 months.  Nelva Bush, MD 10/22/2019 2:23 PM

## 2019-10-21 NOTE — Patient Instructions (Signed)
Medication Instructions:  Your physician has recommended you make the following change in your medication:  1- DECREASE Amlodipine to 2.5 mg by mouth once a day. You may cut your current supply in half.  *If you need a refill on your cardiac medications before your next appointment, please call your pharmacy*   Follow-Up: At [facility], you and your health needs are our priority.  As part of our continuing mission to provide you with exceptional heart care, we have created designated Provider Care Teams.  These Care Teams include your primary Cardiologist (physician) and Advanced Practice Providers (APPs -  Physician Assistants and Nurse Practitioners) who all work together to provide you with the care you need, when you need it.  We recommend signing up for the patient portal called "MyChart".  Sign up information is provided on this After Visit Summary.  MyChart is used to connect with patients for Virtual Visits (Telemedicine).  Patients are able to view lab/test results, encounter notes, upcoming appointments, etc.  Non-urgent messages can be sent to your provider as well.   To learn more about what you can do with MyChart, go to [email or web address].    Your next appointment:   2 month(s)  The format for your next appointment:   In Person  Provider:    You may see Nelva Bush, MD or one of the following Advanced Practice Providers on your designated Care Team:    Murray Hodgkins, NP  Christell Faith, PA-C  Marrianne Mood, PA-C

## 2019-10-22 ENCOUNTER — Encounter: Payer: Self-pay | Admitting: Internal Medicine

## 2019-10-22 DIAGNOSIS — N2581 Secondary hyperparathyroidism of renal origin: Secondary | ICD-10-CM | POA: Diagnosis not present

## 2019-10-22 DIAGNOSIS — N186 End stage renal disease: Secondary | ICD-10-CM | POA: Insufficient documentation

## 2019-10-22 DIAGNOSIS — Z992 Dependence on renal dialysis: Secondary | ICD-10-CM | POA: Diagnosis not present

## 2019-10-22 DIAGNOSIS — I255 Ischemic cardiomyopathy: Secondary | ICD-10-CM | POA: Insufficient documentation

## 2019-10-24 DIAGNOSIS — Z992 Dependence on renal dialysis: Secondary | ICD-10-CM | POA: Diagnosis not present

## 2019-10-24 DIAGNOSIS — N186 End stage renal disease: Secondary | ICD-10-CM | POA: Diagnosis not present

## 2019-10-24 DIAGNOSIS — N2581 Secondary hyperparathyroidism of renal origin: Secondary | ICD-10-CM | POA: Diagnosis not present

## 2019-10-27 DIAGNOSIS — Z992 Dependence on renal dialysis: Secondary | ICD-10-CM | POA: Diagnosis not present

## 2019-10-27 DIAGNOSIS — N2581 Secondary hyperparathyroidism of renal origin: Secondary | ICD-10-CM | POA: Diagnosis not present

## 2019-10-27 DIAGNOSIS — N186 End stage renal disease: Secondary | ICD-10-CM | POA: Diagnosis not present

## 2019-10-28 ENCOUNTER — Telehealth: Payer: Self-pay

## 2019-10-28 ENCOUNTER — Telehealth: Payer: Self-pay | Admitting: Family Medicine

## 2019-10-28 NOTE — Telephone Encounter (Signed)
Pt is having hand pain along with swelling. No other symptoms  Pt daughter would like a call back on what she needs to do

## 2019-10-28 NOTE — Telephone Encounter (Signed)
Per provider disregard this message.  Ellena Kamen,cma

## 2019-10-28 NOTE — Telephone Encounter (Signed)
Is it both of his hands? Does he have swelling in his arms or is it just in his hands? Unfortunately he can not take any antiinflammatories such as ibuprofen or aleve given his kidney function. He could try tylenol and see if that will decrease the pain. Would they be willing to come in for visit for me to see his hands and examine him?

## 2019-10-28 NOTE — Telephone Encounter (Signed)
Noted  

## 2019-10-28 NOTE — Telephone Encounter (Signed)
I do not think it would be any of his medications. They could try tylenol to see if that would help, though he really should be seen in person for this so he can have an exam to help determine a specific cause. Would they be willing to come in for a visit with me or one of the NPs?

## 2019-10-28 NOTE — Telephone Encounter (Signed)
Patient has hand swelling and pain in his hands. No other symptoms. Please advise.  Lukasz Rogus,cma

## 2019-10-28 NOTE — Telephone Encounter (Signed)
I called and spoke with the patient's daughter and I informed her that the provider stated she could try tylenol and he wanted me to ask her if she was willing to bring the patient in to see the cause of the swelling.  The daughter stated that she would try the tylenol and see if it helps wit the pain and if she sees no improvement she would call back and schedule a visit.  Aliza Moret,cma

## 2019-10-29 ENCOUNTER — Telehealth: Payer: Self-pay

## 2019-10-29 DIAGNOSIS — N2581 Secondary hyperparathyroidism of renal origin: Secondary | ICD-10-CM | POA: Diagnosis not present

## 2019-10-29 DIAGNOSIS — Z992 Dependence on renal dialysis: Secondary | ICD-10-CM | POA: Diagnosis not present

## 2019-10-29 DIAGNOSIS — N186 End stage renal disease: Secondary | ICD-10-CM | POA: Diagnosis not present

## 2019-10-29 NOTE — Telephone Encounter (Signed)
Voicemail left for daughter to schedule follow up palliative care visit.

## 2019-10-31 DIAGNOSIS — Z992 Dependence on renal dialysis: Secondary | ICD-10-CM | POA: Diagnosis not present

## 2019-10-31 DIAGNOSIS — N2581 Secondary hyperparathyroidism of renal origin: Secondary | ICD-10-CM | POA: Diagnosis not present

## 2019-10-31 DIAGNOSIS — N186 End stage renal disease: Secondary | ICD-10-CM | POA: Diagnosis not present

## 2019-11-02 DIAGNOSIS — Z992 Dependence on renal dialysis: Secondary | ICD-10-CM | POA: Diagnosis not present

## 2019-11-02 DIAGNOSIS — N186 End stage renal disease: Secondary | ICD-10-CM | POA: Diagnosis not present

## 2019-11-03 DIAGNOSIS — N186 End stage renal disease: Secondary | ICD-10-CM | POA: Diagnosis not present

## 2019-11-03 DIAGNOSIS — N2581 Secondary hyperparathyroidism of renal origin: Secondary | ICD-10-CM | POA: Diagnosis not present

## 2019-11-03 DIAGNOSIS — J9611 Chronic respiratory failure with hypoxia: Secondary | ICD-10-CM | POA: Diagnosis not present

## 2019-11-03 DIAGNOSIS — Z992 Dependence on renal dialysis: Secondary | ICD-10-CM | POA: Diagnosis not present

## 2019-11-05 DIAGNOSIS — Z992 Dependence on renal dialysis: Secondary | ICD-10-CM | POA: Diagnosis not present

## 2019-11-05 DIAGNOSIS — N186 End stage renal disease: Secondary | ICD-10-CM | POA: Diagnosis not present

## 2019-11-05 DIAGNOSIS — N2581 Secondary hyperparathyroidism of renal origin: Secondary | ICD-10-CM | POA: Diagnosis not present

## 2019-11-07 DIAGNOSIS — Z992 Dependence on renal dialysis: Secondary | ICD-10-CM | POA: Diagnosis not present

## 2019-11-07 DIAGNOSIS — N186 End stage renal disease: Secondary | ICD-10-CM | POA: Diagnosis not present

## 2019-11-07 DIAGNOSIS — N2581 Secondary hyperparathyroidism of renal origin: Secondary | ICD-10-CM | POA: Diagnosis not present

## 2019-11-10 DIAGNOSIS — N186 End stage renal disease: Secondary | ICD-10-CM | POA: Diagnosis not present

## 2019-11-10 DIAGNOSIS — N2581 Secondary hyperparathyroidism of renal origin: Secondary | ICD-10-CM | POA: Diagnosis not present

## 2019-11-10 DIAGNOSIS — Z992 Dependence on renal dialysis: Secondary | ICD-10-CM | POA: Diagnosis not present

## 2019-11-12 DIAGNOSIS — Z992 Dependence on renal dialysis: Secondary | ICD-10-CM | POA: Diagnosis not present

## 2019-11-12 DIAGNOSIS — N186 End stage renal disease: Secondary | ICD-10-CM | POA: Diagnosis not present

## 2019-11-12 DIAGNOSIS — N2581 Secondary hyperparathyroidism of renal origin: Secondary | ICD-10-CM | POA: Diagnosis not present

## 2019-11-14 DIAGNOSIS — N186 End stage renal disease: Secondary | ICD-10-CM | POA: Diagnosis not present

## 2019-11-14 DIAGNOSIS — Z992 Dependence on renal dialysis: Secondary | ICD-10-CM | POA: Diagnosis not present

## 2019-11-14 DIAGNOSIS — N2581 Secondary hyperparathyroidism of renal origin: Secondary | ICD-10-CM | POA: Diagnosis not present

## 2019-11-15 NOTE — Progress Notes (Deleted)
Patient ID: Alex Larson, male    DOB: 01-Feb-1927, 86 y.o.   MRN: [id number]  HPI  Mr Maher is a 86 y/o male with a history of CAD, HTN, CKD, PAD and chronic heart failure.   Echo report from 10/28/2018 reviewed and showed an EF of 10-15% along with moderate/ severe TR.  Admitted 07/16/19 due to GIB. GI consult obtained. 2 units of PRBC's given. EGD and colonoscopy done. Dose of IV lasix given for heart failure. Discharged after 4 days.   He presents today for a follow-up visit with a chief complaint of   Past Medical History:  Diagnosis Date  . CAD (coronary artery disease)   . CHF (congestive heart failure) ([place])   . CKD (chronic kidney disease), stage IV ([place])   . HFrEF (heart failure with reduced ejection fraction) ([place])    a. 10/2018 Echo: EF 10-15%, DD. Diff HK. RVSP 70.4 mmHg. Mildly dil LA. Mod dil RA. Mod-sev TR. Ao sclerosis w/o stenosis.  . Hypertension   . Ischemic cardiomyopathy   . PAD (peripheral artery disease) ([name])    a. s/p RLE bypass.   Past Surgical History:  Procedure Laterality Date  . BYPASS GRAFT     Right lower extremity  . COLONOSCOPY WITH PROPOFOL N/A 07/18/2019   Procedure: COLONOSCOPY WITH PROPOFOL;  Surgeon: Lin Landsman, MD;  Location: [facility] ENDOSCOPY;  Service: Gastroenterology;  Laterality: N/A;  . CORONARY STENT INTERVENTION     x2 in [place].  Details unknown.  Marland Kitchen DIALYSIS/PERMA CATHETER INSERTION N/A 08/31/2019   Procedure: DIALYSIS/PERMA CATHETER INSERTION;  Surgeon: Algernon Huxley, MD;  Location: [place] CV LAB;  Service: Cardiovascular;  Laterality: N/A;  . ESOPHAGOGASTRODUODENOSCOPY (EGD) WITH PROPOFOL N/A 07/18/2019   Procedure: ESOPHAGOGASTRODUODENOSCOPY (EGD) WITH PROPOFOL;  Surgeon: Lin Landsman, MD;  Location: [place];  Service: Gastroenterology;  Laterality: N/A;  . REPLACEMENT TOTAL KNEE BILATERAL     Family History  Problem Relation Age of Onset  . Breast cancer Mother   . Breast cancer Daughter     Social History   Tobacco Use  . Smoking status: Former Smoker    Quit date: 09/06/1949    Years since quitting: 70.2  . Smokeless tobacco: Former Systems developer  . Tobacco comment: quit age 34, smoked from teenage years  Substance Use Topics  . Alcohol use: Not Currently   No Known Allergies     Review of Systems  Constitutional: Negative for appetite change and fatigue.  HENT: Negative for congestion, postnasal drip and sore throat.   Eyes: Negative.   Respiratory: Positive for shortness of breath (with moderate exertion). Negative for cough.   Cardiovascular: Negative for chest pain, palpitations and leg swelling.  Gastrointestinal: Negative for abdominal distention, abdominal pain and blood in stool.  Endocrine: Negative.   Genitourinary: Negative.   Musculoskeletal: Negative for arthralgias and back pain.  Skin: Negative.   Allergic/Immunologic: Negative.   Neurological: Negative for dizziness and light-headedness.  Hematological: Negative for adenopathy. Does not bruise/bleed easily.  Psychiatric/Behavioral: Negative for dysphoric mood and sleep disturbance (sleeping on 1 pillow; wearing oxygen at 2L at bedtime). The patient is not nervous/anxious.       Physical Exam Vitals and nursing note reviewed.  Constitutional:      Appearance: He is well-developed.  HENT:     Head: Normocephalic and atraumatic.  Neck:     Vascular: No JVD.  Cardiovascular:     Rate and Rhythm: Normal rate and regular rhythm.  Pulmonary:  Effort: Pulmonary effort is normal. No respiratory distress.     Breath sounds: No wheezing or rales.  Abdominal:     Palpations: Abdomen is soft.     Tenderness: There is no abdominal tenderness.  Musculoskeletal:        General: No tenderness.     Cervical back: Normal range of motion.     Right lower leg: No tenderness. No edema.     Left lower leg: No tenderness. No edema.  Skin:    General: Skin is warm and dry.  Neurological:     General: No  focal deficit present.     Mental Status: He is alert and oriented to person, place, and time.  Psychiatric:        Behavior: Behavior normal.    Assessment & Plan:  1. Chronic heart failure with reduced ejection fraction- - NYHA class II - euvolemic  - weighing daily; reminded to call for an overnight weight gain of >2 pounds or a weekly weight gain of >5 pounds - weight 161.8 pounds from last visit 3 months ago - not adding salt and is trying to eat low sodium foods - renal function does not allow for entresto titration or initiation of farxiga - saw cardiology (End) 10/21/19 - consider titrating carvedilol if HR allows - BNP 07/16/19 was 3427.0  2: HTN- - BP  - had telemedicine visit with PCP Caryl Bis) 07/22/19 - BMP from 11/05/19 reviewed and showed sodium 140, potassium 3.8, creatinine 3.94   3: CKD stage V- - saw nephrology (K[place]uru) 07/29/19 - saw vascular Owens Shark ) 09/21/19 - upcoming procedure to have dialysis access placed   Medication list was reviewed.

## 2019-11-16 ENCOUNTER — Ambulatory Visit: Payer: Medicare PPO | Admitting: Podiatry

## 2019-11-16 ENCOUNTER — Other Ambulatory Visit: Payer: Self-pay

## 2019-11-16 DIAGNOSIS — M79675 Pain in left toe(s): Secondary | ICD-10-CM

## 2019-11-16 DIAGNOSIS — M79674 Pain in right toe(s): Secondary | ICD-10-CM

## 2019-11-16 DIAGNOSIS — B351 Tinea unguium: Secondary | ICD-10-CM | POA: Diagnosis not present

## 2019-11-17 ENCOUNTER — Ambulatory Visit: Payer: Medicare PPO | Admitting: Family

## 2019-11-17 ENCOUNTER — Encounter: Payer: Self-pay | Admitting: Podiatry

## 2019-11-17 DIAGNOSIS — Z992 Dependence on renal dialysis: Secondary | ICD-10-CM | POA: Diagnosis not present

## 2019-11-17 DIAGNOSIS — N186 End stage renal disease: Secondary | ICD-10-CM | POA: Diagnosis not present

## 2019-11-17 DIAGNOSIS — N2581 Secondary hyperparathyroidism of renal origin: Secondary | ICD-10-CM | POA: Diagnosis not present

## 2019-11-17 NOTE — Progress Notes (Signed)
  Subjective:  Patient ID: Alex Larson, male    DOB: Nov 25, 1926,  MRN: [id number]  Chief Complaint  Patient presents with  . Foot Pain    pt is here for routine foot care, pt also has a bump on the lateral side of the right ankle, he would like to have looked at.    86 y.o. male returns for the above complaint.  Patient presents with thickened elongated dystrophic toenails x10.  Patient states that they are painful to walk on.  It has been going on for couple years has progressively gotten worse.  Patient is not a diabetic.  He denies any other acute complaints.  He would like to have them debrided down as he cannot do it himself.  He denies any other acute complaints.  Objective:  There were no vitals filed for this visit. Podiatric Exam: Vascular: dorsalis pedis and posterior tibial pulses are palpable bilateral. Capillary return is immediate. Temperature gradient is WNL. Skin turgor WNL  Sensorium: Normal Semmes Weinstein monofilament test. Normal tactile sensation bilaterally. Nail Exam: Pt has thick disfigured discolored nails with subungual debris noted bilateral entire nail hallux through fifth toenails.  Pain on palpation to the nails. Ulcer Exam: There is no evidence of ulcer or pre-ulcerative changes or infection. Orthopedic Exam: Muscle tone and strength are WNL. No limitations in general ROM. No crepitus or effusions noted. HAV  B/L.  Hammer toes 2-5  B/L. Skin: No Porokeratosis. No infection or ulcers    Assessment & Plan:  No diagnosis found.  Patient was evaluated and treated and all questions answered.  Onychomycosis with pain  -Nails palliatively debrided as below. -Educated on self-care  Procedure: Nail Debridement Rationale: pain  Type of Debridement: manual, sharp debridement. Instrumentation: Nail nipper, rotary burr. Number of Nails: 10  Procedures and Treatment: Consent by patient was obtained for treatment procedures. The patient understood the  discussion of treatment and procedures well. All questions were answered thoroughly reviewed. Debridement of mycotic and hypertrophic toenails, 1 through 5 bilateral and clearing of subungual debris. No ulceration, no infection noted.  Return Visit-Office Procedure: Patient instructed to return to the office for a follow up visit 3 months for continued evaluation and treatment.  Boneta Lucks, DPM    No follow-ups on file.

## 2019-11-19 DIAGNOSIS — N2581 Secondary hyperparathyroidism of renal origin: Secondary | ICD-10-CM | POA: Diagnosis not present

## 2019-11-19 DIAGNOSIS — N186 End stage renal disease: Secondary | ICD-10-CM | POA: Diagnosis not present

## 2019-11-19 DIAGNOSIS — Z992 Dependence on renal dialysis: Secondary | ICD-10-CM | POA: Diagnosis not present

## 2019-11-21 DIAGNOSIS — N2581 Secondary hyperparathyroidism of renal origin: Secondary | ICD-10-CM | POA: Diagnosis not present

## 2019-11-21 DIAGNOSIS — N186 End stage renal disease: Secondary | ICD-10-CM | POA: Diagnosis not present

## 2019-11-21 DIAGNOSIS — Z992 Dependence on renal dialysis: Secondary | ICD-10-CM | POA: Diagnosis not present

## 2019-11-24 DIAGNOSIS — N186 End stage renal disease: Secondary | ICD-10-CM | POA: Diagnosis not present

## 2019-11-24 DIAGNOSIS — N2581 Secondary hyperparathyroidism of renal origin: Secondary | ICD-10-CM | POA: Diagnosis not present

## 2019-11-24 DIAGNOSIS — Z992 Dependence on renal dialysis: Secondary | ICD-10-CM | POA: Diagnosis not present

## 2019-11-26 DIAGNOSIS — Z992 Dependence on renal dialysis: Secondary | ICD-10-CM | POA: Diagnosis not present

## 2019-11-26 DIAGNOSIS — N186 End stage renal disease: Secondary | ICD-10-CM | POA: Diagnosis not present

## 2019-11-26 DIAGNOSIS — N2581 Secondary hyperparathyroidism of renal origin: Secondary | ICD-10-CM | POA: Diagnosis not present

## 2019-11-28 DIAGNOSIS — Z992 Dependence on renal dialysis: Secondary | ICD-10-CM | POA: Diagnosis not present

## 2019-11-28 DIAGNOSIS — N2581 Secondary hyperparathyroidism of renal origin: Secondary | ICD-10-CM | POA: Diagnosis not present

## 2019-11-28 DIAGNOSIS — N186 End stage renal disease: Secondary | ICD-10-CM | POA: Diagnosis not present

## 2019-12-01 DIAGNOSIS — N2581 Secondary hyperparathyroidism of renal origin: Secondary | ICD-10-CM | POA: Diagnosis not present

## 2019-12-01 DIAGNOSIS — N186 End stage renal disease: Secondary | ICD-10-CM | POA: Diagnosis not present

## 2019-12-01 DIAGNOSIS — Z992 Dependence on renal dialysis: Secondary | ICD-10-CM | POA: Diagnosis not present

## 2019-12-02 DIAGNOSIS — N186 End stage renal disease: Secondary | ICD-10-CM | POA: Diagnosis not present

## 2019-12-02 DIAGNOSIS — Z992 Dependence on renal dialysis: Secondary | ICD-10-CM | POA: Diagnosis not present

## 2019-12-03 DIAGNOSIS — N186 End stage renal disease: Secondary | ICD-10-CM | POA: Diagnosis not present

## 2019-12-03 DIAGNOSIS — J9611 Chronic respiratory failure with hypoxia: Secondary | ICD-10-CM | POA: Diagnosis not present

## 2019-12-03 DIAGNOSIS — N2581 Secondary hyperparathyroidism of renal origin: Secondary | ICD-10-CM | POA: Diagnosis not present

## 2019-12-03 DIAGNOSIS — Z992 Dependence on renal dialysis: Secondary | ICD-10-CM | POA: Diagnosis not present

## 2019-12-05 DIAGNOSIS — N186 End stage renal disease: Secondary | ICD-10-CM | POA: Diagnosis not present

## 2019-12-05 DIAGNOSIS — Z992 Dependence on renal dialysis: Secondary | ICD-10-CM | POA: Diagnosis not present

## 2019-12-05 DIAGNOSIS — N2581 Secondary hyperparathyroidism of renal origin: Secondary | ICD-10-CM | POA: Diagnosis not present

## 2019-12-08 ENCOUNTER — Telehealth: Payer: Self-pay

## 2019-12-08 DIAGNOSIS — Z992 Dependence on renal dialysis: Secondary | ICD-10-CM | POA: Diagnosis not present

## 2019-12-08 DIAGNOSIS — N186 End stage renal disease: Secondary | ICD-10-CM | POA: Diagnosis not present

## 2019-12-08 DIAGNOSIS — N2581 Secondary hyperparathyroidism of renal origin: Secondary | ICD-10-CM | POA: Diagnosis not present

## 2019-12-10 DIAGNOSIS — N186 End stage renal disease: Secondary | ICD-10-CM | POA: Diagnosis not present

## 2019-12-10 DIAGNOSIS — N2581 Secondary hyperparathyroidism of renal origin: Secondary | ICD-10-CM | POA: Diagnosis not present

## 2019-12-10 DIAGNOSIS — Z992 Dependence on renal dialysis: Secondary | ICD-10-CM | POA: Diagnosis not present

## 2019-12-12 DIAGNOSIS — N2581 Secondary hyperparathyroidism of renal origin: Secondary | ICD-10-CM | POA: Diagnosis not present

## 2019-12-12 DIAGNOSIS — Z992 Dependence on renal dialysis: Secondary | ICD-10-CM | POA: Diagnosis not present

## 2019-12-12 DIAGNOSIS — N186 End stage renal disease: Secondary | ICD-10-CM | POA: Diagnosis not present

## 2019-12-14 NOTE — Telephone Encounter (Signed)
I do not see this in my inbasket. Do you still have it?

## 2019-12-15 DIAGNOSIS — N2581 Secondary hyperparathyroidism of renal origin: Secondary | ICD-10-CM | POA: Diagnosis not present

## 2019-12-15 DIAGNOSIS — N186 End stage renal disease: Secondary | ICD-10-CM | POA: Diagnosis not present

## 2019-12-15 DIAGNOSIS — Z992 Dependence on renal dialysis: Secondary | ICD-10-CM | POA: Diagnosis not present

## 2019-12-15 NOTE — Telephone Encounter (Signed)
This form for the patient fo a oxygen concentrator was faxed to [employer]. Confrimation given.  Taneesha Edgin,cma

## 2019-12-15 NOTE — Telephone Encounter (Signed)
Signed. Please fax.

## 2019-12-15 NOTE — Telephone Encounter (Signed)
It is in the sign basket I put it on top.  Allison Silva,cma

## 2019-12-17 DIAGNOSIS — Z992 Dependence on renal dialysis: Secondary | ICD-10-CM | POA: Diagnosis not present

## 2019-12-17 DIAGNOSIS — N2581 Secondary hyperparathyroidism of renal origin: Secondary | ICD-10-CM | POA: Diagnosis not present

## 2019-12-17 DIAGNOSIS — N186 End stage renal disease: Secondary | ICD-10-CM | POA: Diagnosis not present

## 2019-12-19 DIAGNOSIS — N2581 Secondary hyperparathyroidism of renal origin: Secondary | ICD-10-CM | POA: Diagnosis not present

## 2019-12-19 DIAGNOSIS — Z992 Dependence on renal dialysis: Secondary | ICD-10-CM | POA: Diagnosis not present

## 2019-12-19 DIAGNOSIS — N186 End stage renal disease: Secondary | ICD-10-CM | POA: Diagnosis not present

## 2019-12-21 ENCOUNTER — Other Ambulatory Visit: Payer: Self-pay | Admitting: Family Medicine

## 2019-12-22 DIAGNOSIS — N186 End stage renal disease: Secondary | ICD-10-CM | POA: Diagnosis not present

## 2019-12-22 DIAGNOSIS — Z992 Dependence on renal dialysis: Secondary | ICD-10-CM | POA: Diagnosis not present

## 2019-12-22 DIAGNOSIS — N2581 Secondary hyperparathyroidism of renal origin: Secondary | ICD-10-CM | POA: Diagnosis not present

## 2019-12-23 ENCOUNTER — Encounter: Payer: Self-pay | Admitting: Family

## 2019-12-23 ENCOUNTER — Ambulatory Visit: Payer: Medicare PPO | Admitting: Internal Medicine

## 2019-12-23 ENCOUNTER — Other Ambulatory Visit: Payer: Self-pay

## 2019-12-23 ENCOUNTER — Ambulatory Visit: Payer: Medicare PPO | Admitting: Family

## 2019-12-23 VITALS — BP 100/60 | HR 57 | Ht 74.0 in | Wt 154.0 lb

## 2019-12-23 DIAGNOSIS — N186 End stage renal disease: Secondary | ICD-10-CM | POA: Diagnosis not present

## 2019-12-23 DIAGNOSIS — I255 Ischemic cardiomyopathy: Secondary | ICD-10-CM

## 2019-12-23 DIAGNOSIS — I10 Essential (primary) hypertension: Secondary | ICD-10-CM

## 2019-12-23 DIAGNOSIS — I2510 Atherosclerotic heart disease of native coronary artery without angina pectoris: Secondary | ICD-10-CM | POA: Diagnosis not present

## 2019-12-23 DIAGNOSIS — I5022 Chronic systolic (congestive) heart failure: Secondary | ICD-10-CM | POA: Diagnosis not present

## 2019-12-23 MED ORDER — HYDRALAZINE HCL 10 MG PO TABS: 10.0000 mg | ORAL_TABLET | Freq: Two times a day (BID) | ORAL | 2 refills | Status: AC

## 2019-12-23 NOTE — Patient Instructions (Signed)
Medication Instructions:  Your physician has recommended you make the following change in your medication:   STOP Amlodipine  On Monday, START Hydralazine 10mg  twice daily  *If you need a refill on your cardiac medications before your next appointment, please call your pharmacy*   Lab Work: No lab work today.  Testing/Procedures: Your EKG today was stable compared to previous.  Follow-Up: At [facility], you and your health needs are our priority.  As part of our continuing mission to provide you with exceptional heart care, we have created designated Provider Care Teams.  These Care Teams include your primary Cardiologist (physician) and Advanced Practice Providers (APPs -  Physician Assistants and Nurse Practitioners) who all work together to provide you with the care you need, when you need it.  We recommend signing up for the patient portal called "MyChart".  Sign up information is provided on this After Visit Summary.  MyChart is used to connect with patients for Virtual Visits (Telemedicine).  Patients are able to view lab/test results, encounter notes, upcoming appointments, etc.  Non-urgent messages can be sent to your provider as well.   To learn more about what you can do with MyChart, go to [email or web address].    Your next appointment:   In August with the Heart Failure clinic as scheduled  And in 2 months with Dr. Saunders Revel or APP

## 2019-12-23 NOTE — Progress Notes (Signed)
Office Visit    Patient Name: Alex Larson Date of Encounter: 12/23/2019  Primary Care Provider:  Leone Haven, MD Primary Cardiologist:  Nelva Bush, MD Electrophysiologist:  None   Chief Complaint    Alex Larson is a 86 y.o. male with a hx of CAD s/p PCI X2 (details unknown), chronic HFrEF, ESRD, PAD, s/p RLE bypass, chronic respiratory failure with hypoxia, HTN presents today for follow-up of CAD and chronic HFrEF  Past Medical History    Past Medical History:  Diagnosis Date  . CAD (coronary artery disease)   . CHF (congestive heart failure) ([place])   . CKD (chronic kidney disease), stage IV ([place])   . HFrEF (heart failure with reduced ejection fraction) ([place])    a. 10/2018 Echo: EF 10-15%, DD. Diff HK. RVSP 70.4 mmHg. Mildly dil LA. Mod dil RA. Mod-sev TR. Ao sclerosis w/o stenosis.  . Hypertension   . Ischemic cardiomyopathy   . PAD (peripheral artery disease) ([name])    a. s/p RLE bypass.   Past Surgical History:  Procedure Laterality Date  . BYPASS GRAFT     Right lower extremity  . COLONOSCOPY WITH PROPOFOL N/A 07/18/2019   Procedure: COLONOSCOPY WITH PROPOFOL;  Surgeon: Lin Landsman, MD;  Location: [facility] ENDOSCOPY;  Service: Gastroenterology;  Laterality: N/A;  . CORONARY STENT INTERVENTION     x2 in [place].  Details unknown.  Marland Kitchen DIALYSIS/PERMA CATHETER INSERTION N/A 08/31/2019   Procedure: DIALYSIS/PERMA CATHETER INSERTION;  Surgeon: Algernon Huxley, MD;  Location: [place] CV LAB;  Service: Cardiovascular;  Laterality: N/A;  . ESOPHAGOGASTRODUODENOSCOPY (EGD) WITH PROPOFOL N/A 07/18/2019   Procedure: ESOPHAGOGASTRODUODENOSCOPY (EGD) WITH PROPOFOL;  Surgeon: Lin Landsman, MD;  Location: [place];  Service: Gastroenterology;  Laterality: N/A;  . REPLACEMENT TOTAL KNEE BILATERAL      Allergies  No Known Allergies  History of Present Illness    Alex Larson is a 86 y.o. male with a hx of  CAD s/p PCI X2 (details unknown),  chronic HFrEF, ESRD, PAD, s/p RLE bypass, chronic respiratory failure with hypoxia, HTN last seen 10/21/2019 by Dr. Saunders Revel.  Seen in April at which time he had started hemodialysis with 3 sessions completed.  He was transitioned to long-acting isosorbide mononitrate 30 mg daily and additional changes deferred to prevent hypotension with dialysis.  Seen 10/21/2019 with functional status limited but unchanged from prior.  His amlodipine was decreased to 2.5 mg daily with plan to reassess BP at follow-up.  Reports feeling overall well. Does not check BP at home but says it has been "fine" at dialysis.   Reports no shortness of breath nor dyspnea on exertion. Reports no chest pain, pressure, or tightness. No edema, orthopnea, PND. Reports no palpitations.   EKGs/Labs/Other Studies Reviewed:   The following studies were reviewed today:  EKG:  EKG is ordered today.  The ekg ordered today demonstrates SB 57 bpm with L axis deviation  Recent Labs: 07/16/2019: B Natriuretic Peptide 3,427.0 07/17/2019: Magnesium 2.1; TSH 203.000 09/11/2019: ALT 17; BUN 25; Creatinine, Ser 2.66; Hemoglobin 7.1; Platelets 167; Potassium 4.0; Sodium 141  Recent Lipid Panel    Component Value Date/Time   CHOL 187 07/08/2019 1147   TRIG 134 07/08/2019 1147   HDL 41 07/08/2019 1147   CHOLHDL 4.6 07/08/2019 1147   VLDL 27 07/08/2019 1147   LDLCALC 119 (H) 07/08/2019 1147    Home Medications   Current Meds  Medication Sig  . amLODipine (NORVASC) 2.5 MG tablet Take 1 tablet (  2.5 mg total) by mouth daily.  Marland Kitchen aspirin EC 81 MG tablet Take 1 tablet (81 mg total) by mouth daily.  Marland Kitchen atorvastatin (LIPITOR) 40 MG tablet Take 1 tablet (40 mg total) by mouth daily.  Marland Kitchen b complex-vitamin c-folic acid (NEPHRO-VITE) 0.8 MG TABS tablet Take 1 tablet by mouth daily.  . carvedilol (COREG) 3.125 MG tablet TAKE 1 TABLET BY MOUTH TWICE DAILY WITH MEALS  . ferrous sulfate 220 (44 Fe) MG/5ML solution Take 220 mg by mouth daily.  . furosemide  (LASIX) 80 MG tablet Take 1 tablet (80 mg total) by mouth daily.  . isosorbide mononitrate (IMDUR) 30 MG 24 hr tablet Take 1 tablet (30 mg total) by mouth daily.  Marland Kitchen levothyroxine (SYNTHROID) 50 MCG tablet Take 1 tablet (50 mcg total) by mouth daily at 6 (six) AM.  . Nutritional Supplements (NEPRO) LIQD Take 237 mLs by mouth 2 (two) times daily between meals.,  . pantoprazole (PROTONIX) 40 MG tablet Take 1 tablet by mouth once daily      Review of Systems      Review of Systems  Constitutional: Negative for chills, fever and malaise/fatigue.  Cardiovascular: Negative for chest pain, dyspnea on exertion, leg swelling, near-syncope, orthopnea, palpitations and syncope.  Respiratory: Negative for cough, shortness of breath and wheezing.   Gastrointestinal: Negative for nausea and vomiting.  Neurological: Negative for dizziness, light-headedness and weakness.   All other systems reviewed and are otherwise negative except as noted above.  Physical Exam    VS:  BP 100/60 (BP Location: Right Arm, Patient Position: Sitting, Cuff Size: Normal)   Pulse (!) 57   Ht 6\' 2"  (1.88 m)   Wt 154 lb (69.9 kg)   SpO2 90% Comment: 4 Liters of Oxygen  BMI 19.77 kg/m  , BMI Body mass index is 19.77 kg/m. GEN: Well nourished, well developed, in no acute distress. HEENT: normal. Neck: Supple, no JVD, carotid bruits, or masses. Cardiac: RRR, no murmurs, rubs, or gallops. No clubbing, cyanosis, edema.  Radials/DP/PT 2+ and equal bilaterally.  Respiratory:  Respirations regular and unlabored, clear to auscultation bilaterally. GI: Soft, nontender, nondistended, BS + x 4. MS: No deformity or atrophy. Skin: Warm and dry, no rash. Neuro:  Strength and sensation are intact. Psych: Normal affect.  Assessment & Plan    1. Chronic HFrEF due ICM - Euvolemic on exam and volume status managed by nephrology. Functional status limited though unchanged. GDMT includes Coreg, Imdur. Will plan to discontinue Amlodipine  and start Hydralazine 10mg  BID. May need to consider reduced dose Coreg if he is unable to tolerate both Coreg and Hydralazine. Careful monitoring of BP. Defer ACE/ARB/ARNI and MRA due to ESRD and soft blood pressure.   2. CAD - Stable with no anginal symptoms. Continue aspirin, beta blocker, atorvastatin, Imdur.   3. ESRD - Continue HD.  4. HTN - BP borderline low though asymptomatic. Stop Amlodipine, add low-dose Hydralazine as above.   Disposition: Follow up in August with HF clinic and in 2 months with Dr. Saunders Revel or APP.   Loel Dubonnet, NP 12/23/2019, 2:26 PM

## 2019-12-24 DIAGNOSIS — N186 End stage renal disease: Secondary | ICD-10-CM | POA: Diagnosis not present

## 2019-12-24 DIAGNOSIS — N2581 Secondary hyperparathyroidism of renal origin: Secondary | ICD-10-CM | POA: Diagnosis not present

## 2019-12-24 DIAGNOSIS — Z992 Dependence on renal dialysis: Secondary | ICD-10-CM | POA: Diagnosis not present

## 2019-12-26 DIAGNOSIS — N186 End stage renal disease: Secondary | ICD-10-CM | POA: Diagnosis not present

## 2019-12-26 DIAGNOSIS — Z992 Dependence on renal dialysis: Secondary | ICD-10-CM | POA: Diagnosis not present

## 2019-12-26 DIAGNOSIS — N2581 Secondary hyperparathyroidism of renal origin: Secondary | ICD-10-CM | POA: Diagnosis not present

## 2019-12-29 DIAGNOSIS — N2581 Secondary hyperparathyroidism of renal origin: Secondary | ICD-10-CM | POA: Diagnosis not present

## 2019-12-29 DIAGNOSIS — Z992 Dependence on renal dialysis: Secondary | ICD-10-CM | POA: Diagnosis not present

## 2019-12-29 DIAGNOSIS — N186 End stage renal disease: Secondary | ICD-10-CM | POA: Diagnosis not present

## 2019-12-31 DIAGNOSIS — N2581 Secondary hyperparathyroidism of renal origin: Secondary | ICD-10-CM | POA: Diagnosis not present

## 2019-12-31 DIAGNOSIS — Z992 Dependence on renal dialysis: Secondary | ICD-10-CM | POA: Diagnosis not present

## 2019-12-31 DIAGNOSIS — N186 End stage renal disease: Secondary | ICD-10-CM | POA: Diagnosis not present

## 2020-01-02 DIAGNOSIS — N186 End stage renal disease: Secondary | ICD-10-CM | POA: Diagnosis not present

## 2020-01-02 DIAGNOSIS — N2581 Secondary hyperparathyroidism of renal origin: Secondary | ICD-10-CM | POA: Diagnosis not present

## 2020-01-02 DIAGNOSIS — Z992 Dependence on renal dialysis: Secondary | ICD-10-CM | POA: Diagnosis not present

## 2020-01-03 DIAGNOSIS — J9611 Chronic respiratory failure with hypoxia: Secondary | ICD-10-CM | POA: Diagnosis not present

## 2020-01-05 DIAGNOSIS — N186 End stage renal disease: Secondary | ICD-10-CM | POA: Diagnosis not present

## 2020-01-05 DIAGNOSIS — Z992 Dependence on renal dialysis: Secondary | ICD-10-CM | POA: Diagnosis not present

## 2020-01-05 DIAGNOSIS — N2581 Secondary hyperparathyroidism of renal origin: Secondary | ICD-10-CM | POA: Diagnosis not present

## 2020-01-07 ENCOUNTER — Telehealth: Payer: Self-pay

## 2020-01-07 DIAGNOSIS — N186 End stage renal disease: Secondary | ICD-10-CM | POA: Diagnosis not present

## 2020-01-07 DIAGNOSIS — Z992 Dependence on renal dialysis: Secondary | ICD-10-CM | POA: Diagnosis not present

## 2020-01-07 DIAGNOSIS — N2581 Secondary hyperparathyroidism of renal origin: Secondary | ICD-10-CM | POA: Diagnosis not present

## 2020-01-07 NOTE — Telephone Encounter (Signed)
RN call to schedule palliative follow up. Daughter reports patient is doing well and has health care company visiting, no longer wants palliative care. NP notified. Palliative care will sign off.

## 2020-01-09 DIAGNOSIS — Z992 Dependence on renal dialysis: Secondary | ICD-10-CM | POA: Diagnosis not present

## 2020-01-09 DIAGNOSIS — N186 End stage renal disease: Secondary | ICD-10-CM | POA: Diagnosis not present

## 2020-01-09 DIAGNOSIS — N2581 Secondary hyperparathyroidism of renal origin: Secondary | ICD-10-CM | POA: Diagnosis not present

## 2020-01-12 DIAGNOSIS — N2581 Secondary hyperparathyroidism of renal origin: Secondary | ICD-10-CM | POA: Diagnosis not present

## 2020-01-12 DIAGNOSIS — N186 End stage renal disease: Secondary | ICD-10-CM | POA: Diagnosis not present

## 2020-01-12 DIAGNOSIS — Z992 Dependence on renal dialysis: Secondary | ICD-10-CM | POA: Diagnosis not present

## 2020-01-14 ENCOUNTER — Emergency Department: Payer: Medicare PPO

## 2020-01-14 ENCOUNTER — Emergency Department
Admission: EM | Admit: 2020-01-14 | Discharge: 2020-01-14 | Disposition: A | Discharge status: Home / Self Care | Payer: Medicare PPO | Attending: Emergency Medicine | Admitting: Emergency Medicine

## 2020-01-14 ENCOUNTER — Encounter: Payer: Self-pay | Admitting: Emergency Medicine

## 2020-01-14 ENCOUNTER — Other Ambulatory Visit: Payer: Self-pay

## 2020-01-14 DIAGNOSIS — I130 Hypertensive heart and chronic kidney disease with heart failure and stage 1 through stage 4 chronic kidney disease, or unspecified chronic kidney disease: Secondary | ICD-10-CM | POA: Diagnosis not present

## 2020-01-14 DIAGNOSIS — N2581 Secondary hyperparathyroidism of renal origin: Secondary | ICD-10-CM | POA: Diagnosis not present

## 2020-01-14 DIAGNOSIS — Y999 Unspecified external cause status: Secondary | ICD-10-CM | POA: Diagnosis not present

## 2020-01-14 DIAGNOSIS — R69 Illness, unspecified: Secondary | ICD-10-CM | POA: Diagnosis not present

## 2020-01-14 DIAGNOSIS — I509 Heart failure, unspecified: Secondary | ICD-10-CM | POA: Diagnosis not present

## 2020-01-14 DIAGNOSIS — Z79899 Other long term (current) drug therapy: Secondary | ICD-10-CM | POA: Diagnosis not present

## 2020-01-14 DIAGNOSIS — R5381 Other malaise: Secondary | ICD-10-CM | POA: Diagnosis not present

## 2020-01-14 DIAGNOSIS — Z992 Dependence on renal dialysis: Secondary | ICD-10-CM | POA: Diagnosis not present

## 2020-01-14 DIAGNOSIS — F17210 Nicotine dependence, cigarettes, uncomplicated: Secondary | ICD-10-CM | POA: Diagnosis not present

## 2020-01-14 DIAGNOSIS — N186 End stage renal disease: Secondary | ICD-10-CM | POA: Diagnosis not present

## 2020-01-14 DIAGNOSIS — Y92233 Cafeteria of hospital as the place of occurrence of the external cause: Secondary | ICD-10-CM | POA: Insufficient documentation

## 2020-01-14 DIAGNOSIS — S0990XA Unspecified injury of head, initial encounter: Secondary | ICD-10-CM | POA: Diagnosis not present

## 2020-01-14 DIAGNOSIS — W0110XA Fall on same level from slipping, tripping and stumbling with subsequent striking against unspecified object, initial encounter: Secondary | ICD-10-CM | POA: Insufficient documentation

## 2020-01-14 DIAGNOSIS — S0181XA Laceration without foreign body of other part of head, initial encounter: Secondary | ICD-10-CM | POA: Diagnosis not present

## 2020-01-14 DIAGNOSIS — S199XXA Unspecified injury of neck, initial encounter: Secondary | ICD-10-CM | POA: Diagnosis not present

## 2020-01-14 DIAGNOSIS — Y939 Activity, unspecified: Secondary | ICD-10-CM | POA: Diagnosis not present

## 2020-01-14 DIAGNOSIS — I2510 Atherosclerotic heart disease of native coronary artery without angina pectoris: Secondary | ICD-10-CM | POA: Diagnosis not present

## 2020-01-14 DIAGNOSIS — S0182XA Laceration with foreign body of other part of head, initial encounter: Secondary | ICD-10-CM | POA: Insufficient documentation

## 2020-01-14 DIAGNOSIS — W19XXXA Unspecified fall, initial encounter: Secondary | ICD-10-CM | POA: Diagnosis not present

## 2020-01-14 DIAGNOSIS — N184 Chronic kidney disease, stage 4 (severe): Secondary | ICD-10-CM | POA: Insufficient documentation

## 2020-01-14 NOTE — ED Provider Notes (Signed)
[facility] Emergency Department Provider Note  ____________________________________________  Time seen: Approximately 4:44 PM  I have reviewed the triage vital signs and the nursing notes.   HISTORY  Chief Complaint No chief complaint on file.    HPI Alex Larson is a 86 y.o. male who presents the emergency department after falling at dialysis.  Patient was there, tripped as he got off of the scale.  He did fall and strike the right side of his head on the ground.  No loss of consciousness.  Patient sustained a very small laceration above the right eyebrow.  Patient currently denies any complaints.  Denies any headache, vision changes, neck pain, chest pain, shortness of breath, upper or lower extremity pain, low back pain or hip pain.  Patient states that he was referred to the emergency department to ensure "everything was okay."  Again denies any complaints at this time.  Patient has a history of CAD, CHF, CKD on dialysis, hypertension, ischemic cardiomyopathy, peripheral artery disease.         Past Medical History:  Diagnosis Date  . CAD (coronary artery disease)   . CHF (congestive heart failure) ([place])   . CKD (chronic kidney disease), stage IV ([place])   . HFrEF (heart failure with reduced ejection fraction) ([place])    a. 10/2018 Echo: EF 10-15%, DD. Diff HK. RVSP 70.4 mmHg. Mildly dil LA. Mod dil RA. Mod-sev TR. Ao sclerosis w/o stenosis.  . Hypertension   . Ischemic cardiomyopathy   . PAD (peripheral artery disease) ([name])    a. s/p RLE bypass.    Patient Active Problem List   Diagnosis Date Noted  . End stage renal disease ([place]) 10/22/2019  . Ischemic cardiomyopathy 10/22/2019  . CKD (chronic kidney disease), stage IV ([place]) 07/27/2019  . AVM (arteriovenous malformation) of duodenum, acquired   . Chronic heart failure ([place])   . GI bleeding 07/17/2019  . Protein-calorie malnutrition, severe 07/17/2019  . Acute on chronic blood loss anemia   .  Melena   . Acute renal failure superimposed on chronic kidney disease ([place])   . GERD (gastroesophageal reflux disease) 06/04/2019  . Peripheral vascular disease, unspecified ([place]) 02/24/2019  . Hypertension 02/24/2019  . History of anemia 11/29/2018  . Chronic respiratory failure with hypoxia ([place])   . Hypothyroidism   . Coronary artery disease involving native coronary artery of native heart without angina pectoris   . Chronic kidney disease (CKD), stage V ([place])   . Chronic combined systolic and diastolic heart failure ([place]) 10/27/2018    Past Surgical History:  Procedure Laterality Date  . BYPASS GRAFT     Right lower extremity  . COLONOSCOPY WITH PROPOFOL N/A 07/18/2019   Procedure: COLONOSCOPY WITH PROPOFOL;  Surgeon: Lin Landsman, MD;  Location: [facility] ENDOSCOPY;  Service: Gastroenterology;  Laterality: N/A;  . CORONARY STENT INTERVENTION     x2 in [place].  Details unknown.  Marland Kitchen DIALYSIS/PERMA CATHETER INSERTION N/A 08/31/2019   Procedure: DIALYSIS/PERMA CATHETER INSERTION;  Surgeon: Algernon Huxley, MD;  Location: [facility];  Service: Cardiovascular;  Laterality: N/A;  . ESOPHAGOGASTRODUODENOSCOPY (EGD) WITH PROPOFOL N/A 07/18/2019   Procedure: ESOPHAGOGASTRODUODENOSCOPY (EGD) WITH PROPOFOL;  Surgeon: Lin Landsman, MD;  Location: [place];  Service: Gastroenterology;  Laterality: N/A;  . REPLACEMENT TOTAL KNEE BILATERAL      Prior to Admission medications   Medication Sig Start Date End Date Taking? Authorizing Provider  aspirin EC 81 MG tablet Take 1 tablet (81 mg total) by  mouth daily. 07/29/19   End, Harrell Gave, MD  atorvastatin (LIPITOR) 40 MG tablet Take 1 tablet (40 mg total) by mouth daily. 05/13/19 10/20/28  Theora Gianotti, NP  b complex-vitamin c-folic acid (NEPHRO-VITE) 0.8 MG TABS tablet Take 1 tablet by mouth daily. 09/30/19   [provider]  carvedilol (COREG) 3.125 MG tablet TAKE 1 TABLET BY MOUTH TWICE DAILY WITH MEALS  09/27/19   Darylene Price A, FNP  ferrous sulfate 220 (44 Fe) MG/5ML solution Take 220 mg by mouth daily.    [provider]  furosemide (LASIX) 80 MG tablet Take 1 tablet (80 mg total) by mouth daily. 07/20/19   Fritzi Mandes, MD  hydrALAZINE (APRESOLINE) 10 MG tablet Take 1 tablet (10 mg total) by mouth in the morning and at bedtime. 12/23/19   Loel Dubonnet, NP  isosorbide mononitrate (IMDUR) 30 MG 24 hr tablet Take 1 tablet (30 mg total) by mouth daily. 09/09/19 12/23/19  End, Harrell Gave, MD  levothyroxine (SYNTHROID) 50 MCG tablet Take 1 tablet (50 mcg total) by mouth daily at 6 (six) AM. 08/25/19   Leone Haven, MD  Nutritional Supplements (NEPRO) LIQD Take 237 mLs by mouth 2 (two) times daily between meals., 07/24/19   Leone Haven, MD  pantoprazole (PROTONIX) 40 MG tablet Take 1 tablet by mouth once daily 12/21/19   Leone Haven, MD    Allergies Patient has no known allergies.  Family History  Problem Relation Age of Onset  . Breast cancer Mother   . Breast cancer Daughter     Social History Social History   Tobacco Use  . Smoking status: Former Smoker    Quit date: 09/06/1949    Years since quitting: 70.4  . Smokeless tobacco: Former Systems developer  . Tobacco comment: quit age 32, smoked from teenage years  3 Use  . Vaping Use: Never used  Substance Use Topics  . Alcohol use: Not Currently  . Drug use: Never     Review of Systems  Constitutional: No fever/chills.  Positive for a mechanical fall striking his head with a small laceration above the right eyebrow. Eyes: No visual changes. No discharge ENT: No upper respiratory complaints. Cardiovascular: no chest pain. Respiratory: no cough. No SOB. Gastrointestinal: No abdominal pain.  No nausea, no vomiting.  No diarrhea.  No constipation. Musculoskeletal: Negative for musculoskeletal pain. Skin: Negative for rash, abrasions, lacerations, ecchymosis. Neurological: Negative for headaches, focal weakness  or numbness. 10-point ROS otherwise negative.  ____________________________________________   PHYSICAL EXAM:  VITAL SIGNS: ED Triage Vitals [01/14/20 1641]  Enc Vitals Group     BP      Pulse      Resp      Temp      Temp src      SpO2      Weight 154 lb 5.2 oz (70 kg)     Height 6\' 2"  (1.88 m)     Head Circumference      Peak Flow      Pain Score      Pain Loc      Pain Edu?      Excl. in [place]?      Constitutional: Alert and oriented. Well appearing and in no acute distress. Eyes: Conjunctivae are normal. PERRL. EOMI. Head: Superficial laceration measuring less than 1 cm noted above the right eyebrow.  No bleeding.  No gross edema.  No ecchymosis.  Patient is nontender to palpation over this region and all other structures  of the skull and face.  No palpable abnormality or crepitus.  No subcutaneous emphysema.  No battle signs or raccoon eyes. ENT:      Ears:       Nose: No congestion/rhinnorhea.      Mouth/Throat: Mucous membranes are moist.  Neck: No stridor.  No cervical spine tenderness to palpation  Cardiovascular: Normal rate, regular rhythm. Normal S1 and S2.  Good peripheral circulation. Respiratory: Normal respiratory effort without tachypnea or retractions. Lungs CTAB. Good air entry to the bases with no decreased or absent breath sounds. Musculoskeletal: Full range of motion to all extremities. No gross deformities appreciated. Neurologic:  Normal speech and language. No gross focal neurologic deficits are appreciated.  Cranial nerves II through XII grossly intact. Skin:  Skin is warm, dry and intact. No rash noted. Psychiatric: Mood and affect are normal. Speech and behavior are normal. Patient exhibits appropriate insight and judgement.   ____________________________________________   LABS (all labs ordered are listed, but only abnormal results are displayed)  Labs Reviewed - No data to  display ____________________________________________  EKG   ____________________________________________  RADIOLOGY I personally viewed and evaluated these images as part of my medical decision making, as well as reviewing the written report by the radiologist.  CT Head Wo Contrast  Result Date: 01/14/2020 CLINICAL DATA:  Fall, abrasion to RIGHT eyebrow. EXAM: CT HEAD WITHOUT CONTRAST CT CERVICAL SPINE WITHOUT CONTRAST TECHNIQUE: Multidetector CT imaging of the head and cervical spine was performed following the standard protocol without intravenous contrast. Multiplanar CT image reconstructions of the cervical spine were also generated. COMPARISON:  None. FINDINGS: CT HEAD FINDINGS Brain: Generalized age related parenchymal volume loss with commensurate dilatation of the ventricles and sulci. Mild chronic small vessel ischemic changes within the bilateral periventricular white matter regions. No mass, hemorrhage, edema or other evidence of acute parenchymal abnormality. No extra-axial hemorrhage. Vascular: Chronic calcified atherosclerotic changes of the large vessels at the skull base. No unexpected hyperdense vessel. Skull: Normal. Negative for fracture or focal lesion. Sinuses/Orbits: No acute finding. Other: None. CT CERVICAL SPINE FINDINGS Alignment: Mild scoliosis. Slight reversal of the normal cervical spine lordosis, likely related to underlying degenerative change. No evidence of acute vertebral body subluxation. Skull base and vertebrae: No fracture line or displaced fracture fragment appreciated. Soft tissues and spinal canal: No prevertebral fluid or swelling. No visible canal hematoma. Disc levels: Degenerative spondylosis at the C2-3 through C6-7 levels, at least moderate in degree with associated disc space narrowings and osseous spurring. Associated disc-osteophytic bulges causing moderate central canal stenoses at the C3-4 through C5-6 levels. Upper chest: Biapical scarring/fibrosis.   No acute findings. Other: Bilateral carotid atherosclerosis. RIGHT IJ catheter in place, incompletely imaged. IMPRESSION: 1. No acute intracranial abnormality. No intracranial mass, hemorrhage or edema. No skull fracture. Chronic small vessel ischemic changes within the white matter. 2. No fracture or acute subluxation within the cervical spine. 3. Degenerative spondylosis of the C2-3 through C6-7 levels, at least moderate in degree with associated disc-osteophytic bulges causing moderate central canal stenoses at the C3-4 through C5-6 levels. 4. Carotid atherosclerosis. Electronically Signed   By: Franki Cabot M.D.   On: 01/14/2020 17:25   CT Cervical Spine Wo Contrast  Result Date: 01/14/2020 CLINICAL DATA:  Fall, abrasion to RIGHT eyebrow. EXAM: CT HEAD WITHOUT CONTRAST CT CERVICAL SPINE WITHOUT CONTRAST TECHNIQUE: Multidetector CT imaging of the head and cervical spine was performed following the standard protocol without intravenous contrast. Multiplanar CT image reconstructions of the cervical spine were  also generated. COMPARISON:  None. FINDINGS: CT HEAD FINDINGS Brain: Generalized age related parenchymal volume loss with commensurate dilatation of the ventricles and sulci. Mild chronic small vessel ischemic changes within the bilateral periventricular white matter regions. No mass, hemorrhage, edema or other evidence of acute parenchymal abnormality. No extra-axial hemorrhage. Vascular: Chronic calcified atherosclerotic changes of the large vessels at the skull base. No unexpected hyperdense vessel. Skull: Normal. Negative for fracture or focal lesion. Sinuses/Orbits: No acute finding. Other: None. CT CERVICAL SPINE FINDINGS Alignment: Mild scoliosis. Slight reversal of the normal cervical spine lordosis, likely related to underlying degenerative change. No evidence of acute vertebral body subluxation. Skull base and vertebrae: No fracture line or displaced fracture fragment appreciated. Soft tissues  and spinal canal: No prevertebral fluid or swelling. No visible canal hematoma. Disc levels: Degenerative spondylosis at the C2-3 through C6-7 levels, at least moderate in degree with associated disc space narrowings and osseous spurring. Associated disc-osteophytic bulges causing moderate central canal stenoses at the C3-4 through C5-6 levels. Upper chest: Biapical scarring/fibrosis.  No acute findings. Other: Bilateral carotid atherosclerosis. RIGHT IJ catheter in place, incompletely imaged. IMPRESSION: 1. No acute intracranial abnormality. No intracranial mass, hemorrhage or edema. No skull fracture. Chronic small vessel ischemic changes within the white matter. 2. No fracture or acute subluxation within the cervical spine. 3. Degenerative spondylosis of the C2-3 through C6-7 levels, at least moderate in degree with associated disc-osteophytic bulges causing moderate central canal stenoses at the C3-4 through C5-6 levels. 4. Carotid atherosclerosis. Electronically Signed   By: Franki Cabot M.D.   On: 01/14/2020 17:25    ____________________________________________    PROCEDURES  Procedure(s) performed:    Procedures    Medications - No data to display   ____________________________________________   INITIAL IMPRESSION / ASSESSMENT AND PLAN / ED COURSE  Pertinent labs & imaging results that were available during my care of the patient were reviewed by me and considered in my medical decision making (see chart for details).  Review of the [place] CSRS was performed in accordance of the [place] prior to dispensing any controlled drugs.           Patient's diagnosis is consistent with fall, facial laceration.  Patient presented to emergency department after mechanical fall.  He did strike his head but had no complaints other than small laceration of the right eyebrow.  This was amenable to closure with Dermabond.  Imaging due to mechanism of injury but imaging is reassuring with no acute  traumatic findings.  Patient is neurologically intact.  He denied any other complaints.  Stable for discharge at this time.  Over-the-counter medications as needed.  Follow-up primary care as needed..Patient is given ED precautions to return to the ED for any worsening or new symptoms.     ____________________________________________  FINAL CLINICAL IMPRESSION(S) / ED DIAGNOSES  Final diagnoses:  Fall, initial encounter  Laceration of forehead, initial encounter      NEW MEDICATIONS STARTED DURING THIS VISIT:  ED Discharge Orders    None          This chart was dictated using voice recognition software/Dragon. Despite best efforts to proofread, errors can occur which can change the meaning. Any change was purely unintentional.    Darletta Moll, PA-C 01/14/20 1800    Nance Pear, MD 01/14/20 1807

## 2020-01-14 NOTE — ED Notes (Signed)
Changed patient's oxygen tank out with new full tank.  Patient on 4l/ [place] home oxygen.  AAOx3.  Skin warm and dry. NAD

## 2020-01-14 NOTE — ED Triage Notes (Signed)
First Nurse Note:  Arrives via [facility], patient tripped over oxygen tubing at dialysis.  Slight abrasion to right eyebrow.  No LOC.  Patient has no complaints.  VS wnl.

## 2020-01-14 NOTE — ED Triage Notes (Signed)
Presentst via EMS s/p trip and fall  States he was getting off the scales and tripped on rubber mat  Min swelling noted near right eye  Denies any other pain

## 2020-01-15 ENCOUNTER — Ambulatory Visit: Payer: Medicare PPO | Admitting: Internal Medicine

## 2020-01-15 ENCOUNTER — Telehealth: Payer: Self-pay | Admitting: Family Medicine

## 2020-01-15 ENCOUNTER — Encounter: Payer: Self-pay | Admitting: Internal Medicine

## 2020-01-15 ENCOUNTER — Ambulatory Visit (INDEPENDENT_AMBULATORY_CARE_PROVIDER_SITE_OTHER): Payer: Medicare PPO

## 2020-01-15 VITALS — BP 100/68 | HR 59 | Temp 97.5°F | Ht 74.0 in | Wt 150.6 lb

## 2020-01-15 DIAGNOSIS — I6523 Occlusion and stenosis of bilateral carotid arteries: Secondary | ICD-10-CM

## 2020-01-15 DIAGNOSIS — S299XXA Unspecified injury of thorax, initial encounter: Secondary | ICD-10-CM | POA: Diagnosis not present

## 2020-01-15 DIAGNOSIS — R0781 Pleurodynia: Secondary | ICD-10-CM

## 2020-01-15 DIAGNOSIS — W19XXXD Unspecified fall, subsequent encounter: Secondary | ICD-10-CM

## 2020-01-15 NOTE — Progress Notes (Signed)
Patient fell while at dialysis and hit his head and side. He was seen in urgent care and ED for head injury. Is still having right sided pain and soreness. No x-ray was done.

## 2020-01-15 NOTE — Telephone Encounter (Signed)
Spoken to daughter, she stated that patient was complaining about chest soreness since fall. Patient was seen in ed but no other imaging was completed other than head CT. Patient stated pain feel better in a certain position while sitting. Appointment was scheduled today with Dr Olivia Mackie.

## 2020-01-15 NOTE — Patient Instructions (Addendum)
Tylenol Lidocaine pain patch    Rib Fracture  A rib fracture is a break or crack in one of the bones of the ribs. The ribs are long, curved bones that wrap around your chest and attach to your spine and your breastbone. The ribs protect your heart, lungs, and other organs in the chest. A broken or cracked rib is often painful but is not usually serious. Most rib fractures heal on their own over time. However, rib fractures can be more serious if multiple ribs are broken or if broken ribs move out of place and push against other structures or organs. What are the causes? This condition is caused by:  Repetitive movements with high force, such as pitching a baseball or having severe coughing spells.  A direct blow to the chest, such as a sports injury, a car accident, or a fall.  Cancer that has spread to the bones, which can weaken bones and cause them to break. What are the signs or symptoms? Symptoms of this condition include:  Pain when you breathe in or cough.  Pain when someone presses on the injured area.  Feeling short of breath. How is this diagnosed? This condition is diagnosed with a physical exam and medical history. Imaging tests may also be done, such as:  Chest X-ray.  CT scan.  MRI.  Bone scan.  Chest ultrasound. How is this treated? Treatment for this condition depends on the severity of the fracture. Most rib fractures usually heal on their own in 1-3 months. Sometimes healing takes longer if there is a cough that does not stop or if there are other activities that make the injury worse (aggravating factors). While you heal, you will be given medicines to control the pain. You will also be taught deep breathing exercises. Severe injuries may require hospitalization or surgery. Follow these instructions at home: Managing pain, stiffness, and swelling  If directed, apply ice to the injured area. ? Put ice in a plastic bag. ? Place a towel between your skin  and the bag. ? Leave the ice on for 20 minutes, 2-3 times a day.  Take over-the-counter and prescription medicines only as told by your health care provider. Activity  Avoid a lot of activity and any activities or movements that cause pain. Be careful during activities and avoid bumping the injured rib.  Slowly increase your activity as told by your health care provider. General instructions  Do deep breathing exercises as told by your health care provider. This helps prevent pneumonia, which is a common complication of a broken rib. Your health care provider may instruct you to: ? Take deep breaths several times a day. ? Try to cough several times a day, holding a pillow against the injured area. ? Use a device called incentive spirometer to practice deep breathing several times a day.  Drink enough fluid to keep your urine pale yellow.  Do not wear a rib belt or binder. These restrict breathing, which can lead to pneumonia.  Keep all follow-up visits as told by your health care provider. This is important. Contact a health care provider if:  You have a fever. Get help right away if:  You have difficulty breathing or you are short of breath.  You develop a cough that does not stop, or you cough up thick or bloody sputum.  You have nausea, vomiting, or pain in your abdomen.  Your pain gets worse and medicine does not help. Summary  A rib fracture is  a break or crack in one of the bones of the ribs.  A broken or cracked rib is often painful but is not usually serious.  Most rib fractures heal on their own over time.  Treatment for this condition depends on the severity of the fracture.  Avoid a lot of activity and any activities or movements that cause pain. This information is not intended to replace advice given to you by your health care provider. Make sure you discuss any questions you have with your health care provider. Document Revised: 05/03/2017 Document Reviewed:  08/20/2016 Elsevier Patient Education  [place].

## 2020-01-15 NOTE — Telephone Encounter (Signed)
Pt daughter called pt had a fall yesterday and was seen in the ED. daughter wanted to know what to do because he is saying his chest is sore but not hurting

## 2020-01-15 NOTE — Progress Notes (Signed)
Chief Complaint  Patient presents with  . Hospitalization Follow-up  . Chest Pain   Ed f/u [facility] fall 8/12/21with daughter Jacob Moores   at HD hit head with laceration right eyebrow with glue on it and healing today c/o right upper rib pain last night and daughter gave 650 mg tylenol x 1 and today soreness is complaint with deep breath in and out 2/10. He feel after tripping on rug at [facility] dialysis yesterday   Of note cas noted on CT imaging will need US carotid in future    Review of Systems  Respiratory: Negative for shortness of breath.        On 4L O2 continuous    Cardiovascular: Negative for chest pain.  Musculoskeletal: Positive for joint pain.       +right rib pain    Past Medical History:  Diagnosis Date  . CAD (coronary artery disease)   . CHF (congestive heart failure) ([place])   . CKD (chronic kidney disease), stage IV ([place])   . HFrEF (heart failure with reduced ejection fraction) ([place])    a. 10/2018 Echo: EF 10-15%, DD. Diff HK. RVSP 70.4 mmHg. Mildly dil LA. Mod dil RA. Mod-sev TR. Ao sclerosis w/o stenosis.  . Hypertension   . Ischemic cardiomyopathy   . PAD (peripheral artery disease) ([name])    a. s/p RLE bypass.   Past Surgical History:  Procedure Laterality Date  . BYPASS GRAFT     Right lower extremity  . COLONOSCOPY WITH PROPOFOL N/A 07/18/2019   Procedure: COLONOSCOPY WITH PROPOFOL;  Surgeon: Lin Landsman, MD;  Location: [facility] ENDOSCOPY;  Service: Gastroenterology;  Laterality: N/A;  . CORONARY STENT INTERVENTION     x2 in [place].  Details unknown.  Marland Kitchen DIALYSIS/PERMA CATHETER INSERTION N/A 08/31/2019   Procedure: DIALYSIS/PERMA CATHETER INSERTION;  Surgeon: Algernon Huxley, MD;  Location: [place] CV LAB;  Service: Cardiovascular;  Laterality: N/A;  . ESOPHAGOGASTRODUODENOSCOPY (EGD) WITH PROPOFOL N/A 07/18/2019   Procedure: ESOPHAGOGASTRODUODENOSCOPY (EGD) WITH PROPOFOL;  Surgeon: Lin Landsman, MD;  Location: [place];  Service:  Gastroenterology;  Laterality: N/A;  . REPLACEMENT TOTAL KNEE BILATERAL     Family History  Problem Relation Age of Onset  . Breast cancer Mother   . Breast cancer Daughter    Social History   Socioeconomic History  . Marital status: Widowed    Spouse name: Not on file  . Number of children: Not on file  . Years of education: Not on file  . Highest education level: Not on file  Occupational History  . Occupation: retired  Tobacco Use  . Smoking status: Former Smoker    Quit date: 09/06/1949    Years since quitting: 70.4  . Smokeless tobacco: Former Systems developer  . Tobacco comment: quit age 48, smoked from teenage years  75 Use  . Vaping Use: Never used  Substance and Sexual Activity  . Alcohol use: Not Currently  . Drug use: Never  . Sexual activity: Not Currently  Other Topics Concern  . Not on file  Social History Narrative  . Not on file   Social Determinants of Health   Financial Resource Strain: Low Risk   . Difficulty of Paying Living Expenses: Not hard at all  Food Insecurity: No Food Insecurity  . Worried About Charity fundraiser in the Last Year: Never true  . Ran Out of Food in the Last Year: Never true  Transportation Needs: No Transportation Needs  . Lack of Transportation (Medical): No  .  Lack of Transportation (Non-Medical): No  Physical Activity: Sufficiently Active  . Days of Exercise per Week: 7 days  . Minutes of Exercise per Session: 30 min  Stress: No Stress Concern Present  . Feeling of Stress : Not at all  Social Connections: Socially Isolated  . Frequency of Communication with Friends and Family: Not on file  . Frequency of Social Gatherings with Friends and Family: More than three times a week  . Attends Religious Services: Never  . Active Member of Clubs or Organizations: No  . Attends Archivist Meetings: Never  . Marital Status: Never married  Intimate Partner Violence: Not At Risk  . Fear of Current or Ex-Partner: No  .  Emotionally Abused: No  . Physically Abused: No  . Sexually Abused: No   Current Meds  Medication Sig  . aspirin EC 81 MG tablet Take 1 tablet (81 mg total) by mouth daily.  Marland Kitchen atorvastatin (LIPITOR) 40 MG tablet Take 1 tablet (40 mg total) by mouth daily.  Marland Kitchen b complex-vitamin c-folic acid (NEPHRO-VITE) 0.8 MG TABS tablet Take 1 tablet by mouth daily.  . carvedilol (COREG) 3.125 MG tablet TAKE 1 TABLET BY MOUTH TWICE DAILY WITH MEALS  . ferrous sulfate 220 (44 Fe) MG/5ML solution Take 220 mg by mouth daily.  . furosemide (LASIX) 80 MG tablet Take 1 tablet (80 mg total) by mouth daily.  . hydrALAZINE (APRESOLINE) 10 MG tablet Take 1 tablet (10 mg total) by mouth in the morning and at bedtime.  . isosorbide mononitrate (IMDUR) 30 MG 24 hr tablet Take 1 tablet (30 mg total) by mouth daily.  Marland Kitchen levothyroxine (SYNTHROID) 50 MCG tablet Take 1 tablet (50 mcg total) by mouth daily at 6 (six) AM.  . Nutritional Supplements (NEPRO) LIQD Take 237 mLs by mouth 2 (two) times daily between meals.,  . pantoprazole (PROTONIX) 40 MG tablet Take 1 tablet by mouth once daily   No Known Allergies No results found for this or any previous visit (from the past 2160 hour(s)). Objective  Body mass index is 19.34 kg/m. Wt Readings from Last 3 Encounters:  01/15/20 150 lb 9.6 oz (68.3 kg)  01/14/20 154 lb 5.2 oz (70 kg)  12/23/19 154 lb (69.9 kg)   Temp Readings from Last 3 Encounters:  01/15/20 (!) 97.5 F (36.4 C) (Oral)  01/14/20 (!) 97.4 F (36.3 C) (Oral)  09/11/19 (!) 96.1 F (35.6 C)   BP Readings from Last 3 Encounters:  01/15/20 100/68  01/14/20 (!) 133/93  12/23/19 100/60   Pulse Readings from Last 3 Encounters:  01/15/20 (!) 59  01/14/20 73  12/23/19 (!) 57    Physical Exam Vitals and nursing note reviewed.  Constitutional:      Appearance: Normal appearance. He is well-developed and well-groomed.  HENT:     Head: Normocephalic and atraumatic.  Eyes:     Conjunctiva/sclera:  Conjunctivae normal.     Pupils: Pupils are equal, round, and reactive to light.  Cardiovascular:     Rate and Rhythm: Normal rate and regular rhythm.     Heart sounds: Normal heart sounds.  Pulmonary:     Effort: Pulmonary effort is normal.     Breath sounds: Normal breath sounds.  Chest:       Comments: ttp right rib cage  Skin:    General: Skin is warm and dry.  Neurological:     General: No focal deficit present.     Mental Status: He is alert and oriented  to person, place, and time. Mental status is at baseline.     Gait: Gait normal.  Psychiatric:        Attention and Perception: Attention and perception normal.        Mood and Affect: Mood and affect normal.        Speech: Speech normal.        Behavior: Behavior normal. Behavior is cooperative.        Thought Content: Thought content normal.        Cognition and Memory: Cognition and memory normal.        Judgment: Judgment normal.     Assessment  Plan  Rib pain - Plan: DG Ribs Unilateral Right, DG Chest 2 View Tylenol, prn lidocaine pain patch/gel   Fall, subsequent encounter mechanical - Plan: DG Ribs Unilateral Right, DG Chest 2 View  Bilateral carotid artery stenosis  -consider US carotid b/l with PCP in future   Of note had covid 19 vaccine will call back Provider: Dr. Olivia Mackie McLean-Scocuzza-Internal Medicine

## 2020-01-16 DIAGNOSIS — Z992 Dependence on renal dialysis: Secondary | ICD-10-CM | POA: Diagnosis not present

## 2020-01-16 DIAGNOSIS — N186 End stage renal disease: Secondary | ICD-10-CM | POA: Diagnosis not present

## 2020-01-16 DIAGNOSIS — N2581 Secondary hyperparathyroidism of renal origin: Secondary | ICD-10-CM | POA: Diagnosis not present

## 2020-01-18 ENCOUNTER — Telehealth: Payer: Self-pay | Admitting: Family Medicine

## 2020-01-18 DIAGNOSIS — G629 Polyneuropathy, unspecified: Secondary | ICD-10-CM | POA: Diagnosis not present

## 2020-01-18 DIAGNOSIS — I6529 Occlusion and stenosis of unspecified carotid artery: Secondary | ICD-10-CM

## 2020-01-18 DIAGNOSIS — M19031 Primary osteoarthritis, right wrist: Secondary | ICD-10-CM | POA: Diagnosis not present

## 2020-01-18 DIAGNOSIS — M7989 Other specified soft tissue disorders: Secondary | ICD-10-CM | POA: Diagnosis not present

## 2020-01-18 DIAGNOSIS — W010XXA Fall on same level from slipping, tripping and stumbling without subsequent striking against object, initial encounter: Secondary | ICD-10-CM | POA: Diagnosis not present

## 2020-01-18 DIAGNOSIS — S6991XA Unspecified injury of right wrist, hand and finger(s), initial encounter: Secondary | ICD-10-CM | POA: Diagnosis not present

## 2020-01-18 NOTE — Telephone Encounter (Signed)
-----   Message from Delorise Jackson, MD sent at 01/15/2020  3:21 PM EDT ----- Rec carotid US in future

## 2020-01-18 NOTE — Telephone Encounter (Signed)
Patient's daughter called about her father's x-ray results. She would like a call back with results.

## 2020-01-18 NOTE — Telephone Encounter (Signed)
Please find out if the patient is willing to have an [place] of his carotid arteries in his neck to look for possible blockages. Thanks.

## 2020-01-19 DIAGNOSIS — N186 End stage renal disease: Secondary | ICD-10-CM | POA: Diagnosis not present

## 2020-01-19 DIAGNOSIS — N2581 Secondary hyperparathyroidism of renal origin: Secondary | ICD-10-CM | POA: Diagnosis not present

## 2020-01-19 DIAGNOSIS — Z992 Dependence on renal dialysis: Secondary | ICD-10-CM | POA: Diagnosis not present

## 2020-01-19 NOTE — Telephone Encounter (Signed)
Carotid doppler ordered.  

## 2020-01-19 NOTE — Telephone Encounter (Signed)
The patient daughter stated that they would like the US of the neck. I informed her that they will call and schedule.  Alex Larson,cma

## 2020-01-19 NOTE — Progress Notes (Signed)
Patient ID: Alex Larson, male    DOB: Jul 26, 1926, 86 y.o.   MRN: [id number]  HPI  Alex Larson is a 86 y/o male with a history of CAD, HTN, CKD, PAD and chronic heart failure.   Echo report from 10/28/2018 reviewed and showed an EF of 10-15% along with moderate/ severe TR.  Was in the ED 01/14/20 after a mechanical fall. Had a small laceration above right eyebrow. Head CT negative. Laceration closed and he was released.    He presents today with a chief complaint of a follow-up visit. He currently denies any difficulty sleeping, dizziness, abdominal distention, palpitations, pedal edema, chest pain, shortness of breath, cough fatigue or weight gain. Does have some mild right wrist pain after recent fracture.   Past Medical History:  Diagnosis Date  . CAD (coronary artery disease)   . CHF (congestive heart failure) ([place])   . CKD (chronic kidney disease), stage IV ([place])   . HFrEF (heart failure with reduced ejection fraction) ([place])    a. 10/2018 Echo: EF 10-15%, DD. Diff HK. RVSP 70.4 mmHg. Mildly dil LA. Mod dil RA. Mod-sev TR. Ao sclerosis w/o stenosis.  . Hypertension   . Ischemic cardiomyopathy   . PAD (peripheral artery disease) ([name])    a. s/p RLE bypass.   Past Surgical History:  Procedure Laterality Date  . BYPASS GRAFT     Right lower extremity  . COLONOSCOPY WITH PROPOFOL N/A 07/18/2019   Procedure: COLONOSCOPY WITH PROPOFOL;  Surgeon: Lin Landsman, MD;  Location: [facility] ENDOSCOPY;  Service: Gastroenterology;  Laterality: N/A;  . CORONARY STENT INTERVENTION     x2 in [place].  Details unknown.  Marland Kitchen DIALYSIS/PERMA CATHETER INSERTION N/A 08/31/2019   Procedure: DIALYSIS/PERMA CATHETER INSERTION;  Surgeon: Algernon Huxley, MD;  Location: [place] CV LAB;  Service: Cardiovascular;  Laterality: N/A;  . ESOPHAGOGASTRODUODENOSCOPY (EGD) WITH PROPOFOL N/A 07/18/2019   Procedure: ESOPHAGOGASTRODUODENOSCOPY (EGD) WITH PROPOFOL;  Surgeon: Lin Landsman, MD;  Location: [place];  Service: Gastroenterology;  Laterality: N/A;  . REPLACEMENT TOTAL KNEE BILATERAL     Family History  Problem Relation Age of Onset  . Breast cancer Mother   . Breast cancer Daughter    Social History   Tobacco Use  . Smoking status: Former Smoker    Quit date: 09/06/1949    Years since quitting: 70.4  . Smokeless tobacco: Former Systems developer  . Tobacco comment: quit age 85, smoked from teenage years  Substance Use Topics  . Alcohol use: Not Currently   No Known Allergies  Prior to Admission medications   Medication Sig Start Date End Date Taking? Authorizing Provider  aspirin EC 81 MG tablet Take 1 tablet (81 mg total) by mouth daily. 07/29/19  Yes End, Harrell Gave, MD  atorvastatin (LIPITOR) 40 MG tablet Take 1 tablet (40 mg total) by mouth daily. 05/13/19 10/20/28 Yes Theora Gianotti, NP  b complex-vitamin c-folic acid (NEPHRO-VITE) 0.8 MG TABS tablet Take 1 tablet by mouth daily. 09/30/19  Yes [provider]  carvedilol (COREG) 3.125 MG tablet TAKE 1 TABLET BY MOUTH TWICE DAILY WITH MEALS 09/27/19  Yes Darylene Price A, FNP  ferrous sulfate 220 (44 Fe) MG/5ML solution Take 220 mg by mouth daily.   Yes [provider]  furosemide (LASIX) 80 MG tablet Take 1 tablet (80 mg total) by mouth daily. 07/20/19  Yes Fritzi Mandes, MD  hydrALAZINE (APRESOLINE) 10 MG tablet Take 1 tablet (10 mg total) by mouth in the morning and at  bedtime. 12/23/19  Yes Loel Dubonnet, NP  levothyroxine (SYNTHROID) 50 MCG tablet Take 1 tablet (50 mcg total) by mouth daily at 6 (six) AM. 08/25/19  Yes Leone Haven, MD  Nutritional Supplements (NEPRO) LIQD Take 237 mLs by mouth 2 (two) times daily between meals., 07/24/19  Yes Leone Haven, MD  pantoprazole (PROTONIX) 40 MG tablet Take 1 tablet by mouth once daily 12/21/19  Yes Leone Haven, MD  isosorbide mononitrate (IMDUR) 30 MG 24 hr tablet Take 1 tablet (30 mg total) by mouth daily. 09/09/19 01/15/20  End, Harrell Gave,  MD    Review of Systems  Constitutional: Negative for appetite change and fatigue.  HENT: Negative for congestion, postnasal drip and sore throat.   Eyes: Negative.   Respiratory: Negative for cough and shortness of breath.   Cardiovascular: Negative for chest pain, palpitations and leg swelling.  Gastrointestinal: Negative for abdominal distention, abdominal pain and blood in stool.  Endocrine: Negative.   Genitourinary: Negative.   Musculoskeletal: Positive for arthralgias (right wrist). Negative for back pain.  Skin: Negative.   Allergic/Immunologic: Negative.   Neurological: Negative for dizziness and light-headedness.  Hematological: Negative for adenopathy. Does not bruise/bleed easily.  Psychiatric/Behavioral: Negative for dysphoric mood and sleep disturbance (sleeping on 1 pillow; wearing oxygen at 4L at bedtime). The patient is not nervous/anxious.    Vitals:   01/20/20 0955  BP: 116/68  Pulse: 61  Resp: 18  SpO2: 95%  Weight: 151 lb 6 oz (68.7 kg)  Height: 6\' 2"  (1.88 m)   Wt Readings from Last 3 Encounters:  01/20/20 151 lb 6 oz (68.7 kg)  01/15/20 150 lb 9.6 oz (68.3 kg)  01/14/20 154 lb 5.2 oz (70 kg)   Lab Results  Component Value Date   CREATININE 2.66 (H) 09/11/2019   CREATININE 3.98 (H) 08/13/2019   CREATININE 5.23 (H) 07/27/2019    Physical Exam Vitals and nursing note reviewed.  Constitutional:      Appearance: He is well-developed.  HENT:     Head: Normocephalic and atraumatic.  Neck:     Vascular: No JVD.  Cardiovascular:     Rate and Rhythm: Normal rate and regular rhythm.  Pulmonary:     Effort: Pulmonary effort is normal. No respiratory distress.     Breath sounds: No wheezing or rales.  Abdominal:     Palpations: Abdomen is soft.     Tenderness: There is no abdominal tenderness.  Musculoskeletal:        General: No tenderness.     Cervical back: Normal range of motion.     Right lower leg: No tenderness. No edema.     Left lower  leg: No tenderness. No edema.  Skin:    General: Skin is warm and dry.  Neurological:     General: No focal deficit present.     Mental Status: He is alert and oriented to person, place, and time.  Psychiatric:        Behavior: Behavior normal.    Assessment & Plan:  1. Chronic heart failure with reduced ejection fraction- - NYHA class I - euvolemic  - weighing daily; reminded to call for an overnight weight gain of >2 pounds or a weekly weight gain of >5 pounds - weight down 10 pounds from last visit 5 months ago; patient says that he's eating well - not adding salt and is trying to eat low sodium foods - saw cardiology Gilford Rile) 12/23/19 - consider titrating carvedilol if HR allows -  BNP 07/16/19 was 3427.0 - reports receiving both COVID vaccines  2: HTN- - BP looks good today - saw PCP (McLean-Scocuzza) 01/15/20  - BMP from 09/11/19 reviewed and showed sodium 141, potassium 4.0, creatinine 2.66 and GFR 23   3: CKD stage V- - saw nephrology (Kolluru) 07/29/19 - saw vascular Owens Shark) 09/21/19 - has dialysis on T, T, S   Medication list was reviewed.  Due to HF stability, will not make a return appointment for patient at this time. Advised patient that he could call back at anytime to schedule another appointment. Patient was comfortable with this plan.

## 2020-01-19 NOTE — Addendum Note (Signed)
Addended by: Caryl Bis Vernee Baines G on: 01/19/2020 12:58 PM   Modules accepted: Orders

## 2020-01-20 ENCOUNTER — Other Ambulatory Visit: Payer: Self-pay

## 2020-01-20 ENCOUNTER — Ambulatory Visit: Payer: Medicare PPO | Attending: Family | Admitting: Family

## 2020-01-20 ENCOUNTER — Encounter: Payer: Self-pay | Admitting: Family

## 2020-01-20 VITALS — BP 116/68 | HR 61 | Resp 18 | Ht 74.0 in | Wt 151.4 lb

## 2020-01-20 DIAGNOSIS — N184 Chronic kidney disease, stage 4 (severe): Secondary | ICD-10-CM

## 2020-01-20 DIAGNOSIS — Z955 Presence of coronary angioplasty implant and graft: Secondary | ICD-10-CM | POA: Diagnosis not present

## 2020-01-20 DIAGNOSIS — M25831 Other specified joint disorders, right wrist: Secondary | ICD-10-CM | POA: Insufficient documentation

## 2020-01-20 DIAGNOSIS — Z79899 Other long term (current) drug therapy: Secondary | ICD-10-CM | POA: Diagnosis not present

## 2020-01-20 DIAGNOSIS — I739 Peripheral vascular disease, unspecified: Secondary | ICD-10-CM | POA: Insufficient documentation

## 2020-01-20 DIAGNOSIS — I255 Ischemic cardiomyopathy: Secondary | ICD-10-CM | POA: Diagnosis not present

## 2020-01-20 DIAGNOSIS — Z7982 Long term (current) use of aspirin: Secondary | ICD-10-CM | POA: Insufficient documentation

## 2020-01-20 DIAGNOSIS — Z7901 Long term (current) use of anticoagulants: Secondary | ICD-10-CM | POA: Insufficient documentation

## 2020-01-20 DIAGNOSIS — I132 Hypertensive heart and chronic kidney disease with heart failure and with stage 5 chronic kidney disease, or end stage renal disease: Secondary | ICD-10-CM | POA: Diagnosis not present

## 2020-01-20 DIAGNOSIS — I5022 Chronic systolic (congestive) heart failure: Secondary | ICD-10-CM | POA: Diagnosis not present

## 2020-01-20 DIAGNOSIS — I2510 Atherosclerotic heart disease of native coronary artery without angina pectoris: Secondary | ICD-10-CM | POA: Diagnosis not present

## 2020-01-20 DIAGNOSIS — N185 Chronic kidney disease, stage 5: Secondary | ICD-10-CM | POA: Insufficient documentation

## 2020-01-20 DIAGNOSIS — Z992 Dependence on renal dialysis: Secondary | ICD-10-CM | POA: Insufficient documentation

## 2020-01-20 DIAGNOSIS — Z87891 Personal history of nicotine dependence: Secondary | ICD-10-CM | POA: Diagnosis not present

## 2020-01-20 DIAGNOSIS — I10 Essential (primary) hypertension: Secondary | ICD-10-CM

## 2020-01-20 NOTE — Patient Instructions (Addendum)
Continue weighing daily and call for an overnight weight gain of > 2 pounds or a weekly weight gain of >5 pounds.   Call us in the future if you'd like to schedule another appointment 

## 2020-01-21 ENCOUNTER — Telehealth: Payer: Self-pay | Admitting: Family Medicine

## 2020-01-21 DIAGNOSIS — Z992 Dependence on renal dialysis: Secondary | ICD-10-CM | POA: Diagnosis not present

## 2020-01-21 DIAGNOSIS — N186 End stage renal disease: Secondary | ICD-10-CM | POA: Diagnosis not present

## 2020-01-22 ENCOUNTER — Telehealth: Payer: Self-pay | Admitting: Family Medicine

## 2020-01-22 NOTE — Telephone Encounter (Addendum)
I called and spoke with the patients daughter, Georgia Lopes, regarding this. Documentation is in her chart on 01/22/20.

## 2020-01-22 NOTE — Telephone Encounter (Signed)
-----   Message from Lavonia Dana, MD sent at 01/22/2020 12:39 PM EDT ----- Dear Dr. Caryl Bis I received a call from patient's daughter. She was very upset and is requesting an autopsy. I told her I would reach out to you.   Thank you  Lavonia Dana, MD

## 2020-01-22 NOTE — Telephone Encounter (Signed)
Please reach out to the patients daughter.  I received a message from the patient's nephrologist noting that the daughter wanted an autopsy.  Please try to gather more information on this from the daughter.  Typically if the medical examiner has not taken the case the autopsy would have to be paid for by the family and if she would like to proceed with that we could try to find out where she could have this completed though she would likely have to arrange for it herself.

## 2020-01-22 NOTE — Telephone Encounter (Signed)
Patient stated  that the provider needed to reach out to Dr. Juleen China to discuss, she would not inform me of anything.  Alex Larson,cma

## 2020-01-25 ENCOUNTER — Telehealth: Payer: Self-pay

## 2020-01-25 NOTE — Telephone Encounter (Signed)
Called [facility] and reported the completion of Alex Larson's death certification.  They stated that they will pick it up tomorrow on 01/26/20. It has been placed at the front office for pick up.

## 2020-01-28 ENCOUNTER — Telehealth: Payer: Self-pay | Admitting: Family Medicine

## 2020-01-28 NOTE — Telephone Encounter (Signed)
Form placed in the sign basket.  Brigid Vandekamp,cma

## 2020-01-28 NOTE — Telephone Encounter (Signed)
Alex Larson brought death certificate to be filled out. Error on Line #26a -Placed in folder up front. Please call when complete at [phone]

## 2020-01-29 NOTE — Telephone Encounter (Signed)
I called and informed Denice Paradise and [facility] Silos that the patient's death certificate is ready to be picked up at the front desk.they understood.  Desirie Minteer,cma

## 2020-01-29 NOTE — Telephone Encounter (Signed)
Completed. Please call the funeral home for pick up.

## 2020-02-03 NOTE — Telephone Encounter (Signed)
I will sign DC for Dr Chauncey Cruel

## 2020-02-03 NOTE — Telephone Encounter (Signed)
Rich and [facility] Silos brought death certificate to be signed. Placed in folder up front

## 2020-02-03 NOTE — Telephone Encounter (Signed)
I could also sign when I'm in the office tomorrow.

## 2020-02-03 NOTE — Telephone Encounter (Signed)
[facility] called to report patient passed away last night in his sleep

## 2020-02-03 DEATH — deceased

## 2020-02-22 ENCOUNTER — Ambulatory Visit: Payer: Medicare PPO | Admitting: Family

## 2021-05-20 IMAGING — DX PORTABLE CHEST - 1 VIEW
1 series · 1 of 1 positions shown · non-contrast
Comparison: None.

CLINICAL DATA: Shortness of breath

EXAM:
PORTABLE CHEST 1 VIEW

[chest ap]
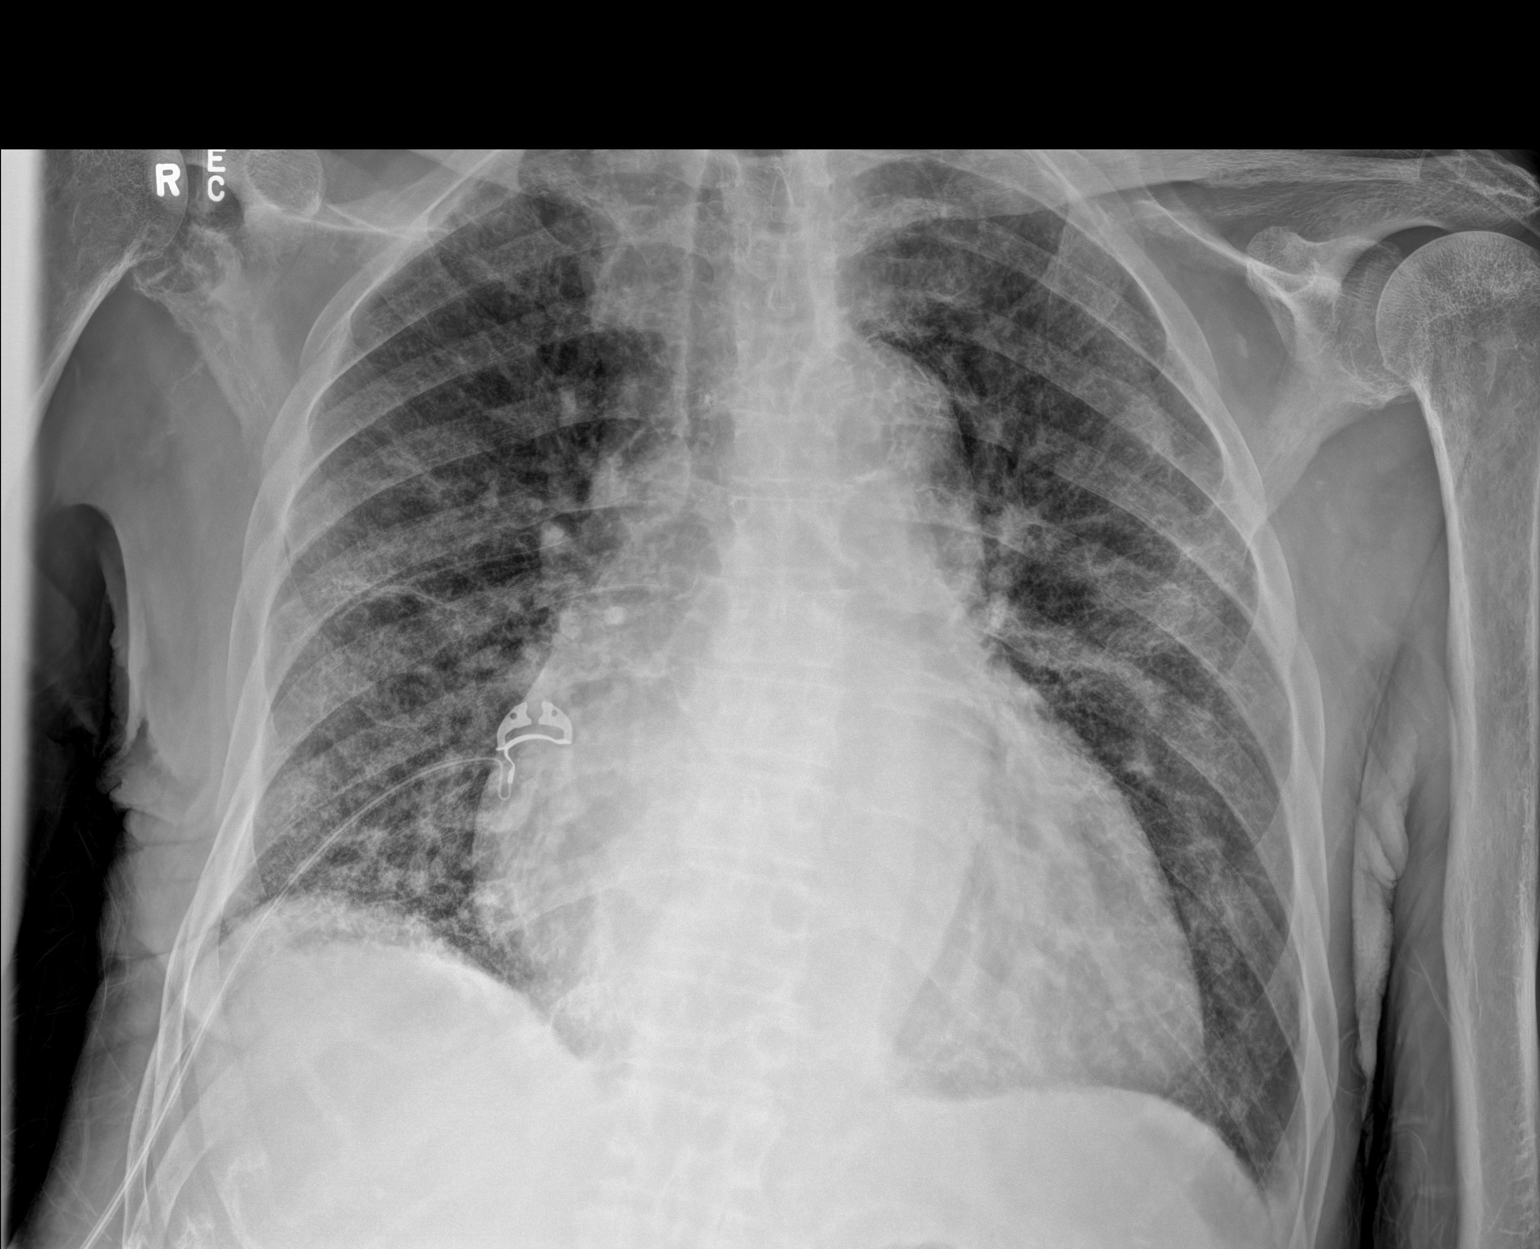

[1 of 1 positions shown; findings below may reference images not displayed]

FINDINGS: The cardiac silhouette is enlarged. There are diffuse prominent
interstitial lung markings bilaterally. No pneumothorax. No large
pleural effusion. There is mild elevation of the right
hemidiaphragm. Aortic calcifications are noted. There is no acute
osseous abnormality. Degenerative changes are noted of both
shoulders.
IMPRESSION: Cardiomegaly with prominent interstitial lung markings. Findings can
be seen in patients with pulmonary edema versus an atypical
infectious process.

## 2021-05-20 IMAGING — US VENOUS DOPPLER ULTRASOUND OF  LOWER EXTREMITIES
1 series · 14 of 24 positions shown · non-contrast
Comparison: None

CLINICAL DATA: Lower extremity edema. History of peripheral
vascular disease.

EXAM:
BILATERAL LOWER EXTREMITY VENOUS DOPPLER ULTRASOUND
TECHNIQUE: Gray-scale sonography with compression, as well as color and duplex
ultrasound, were performed to evaluate the deep venous system from
the level of the common femoral vein through the popliteal and
proximal calf veins.

[Series 1: venous doppler ultrasound of lower extremities · 14 of 64 slices shown]
[im 1/64]
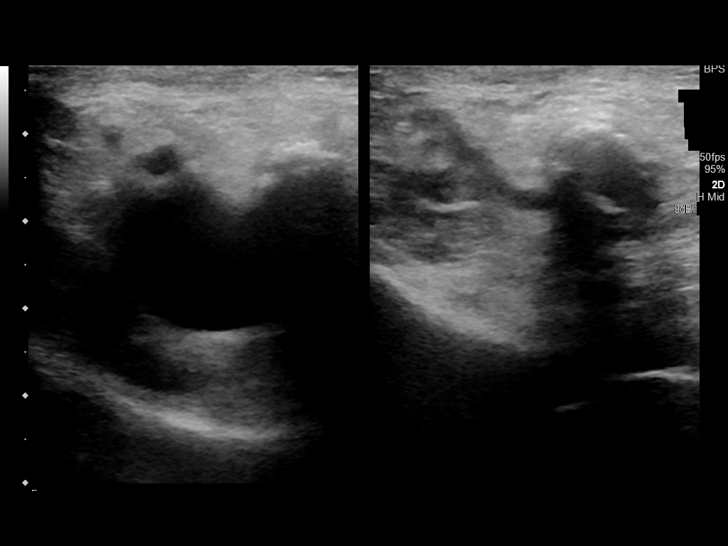
[im 6/64]
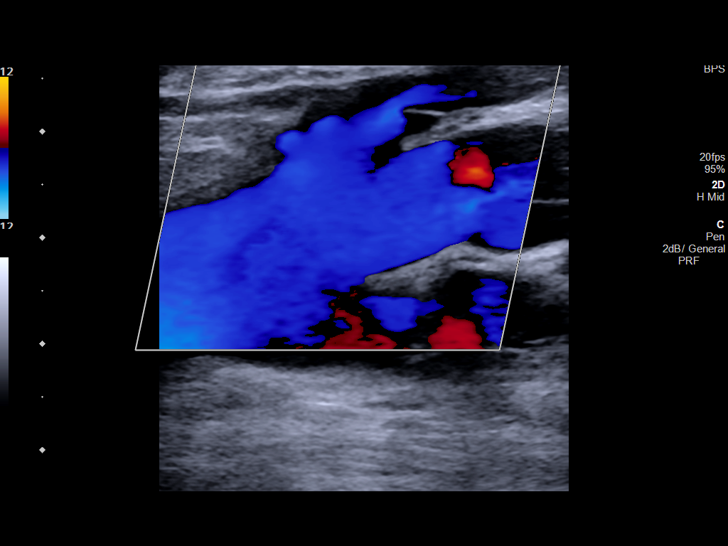
[im 11/64]
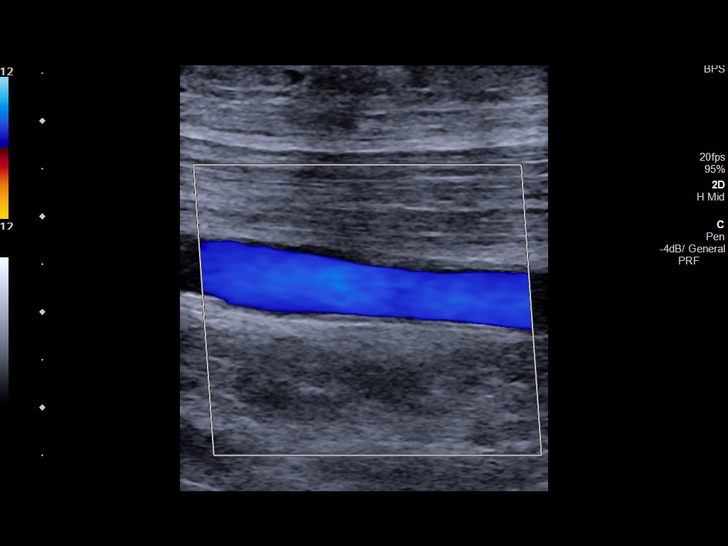
[im 17/64]
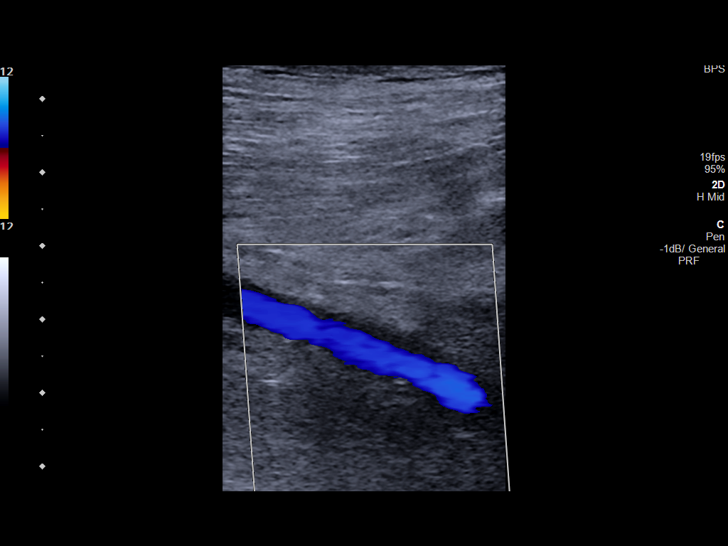
[im 20/64]
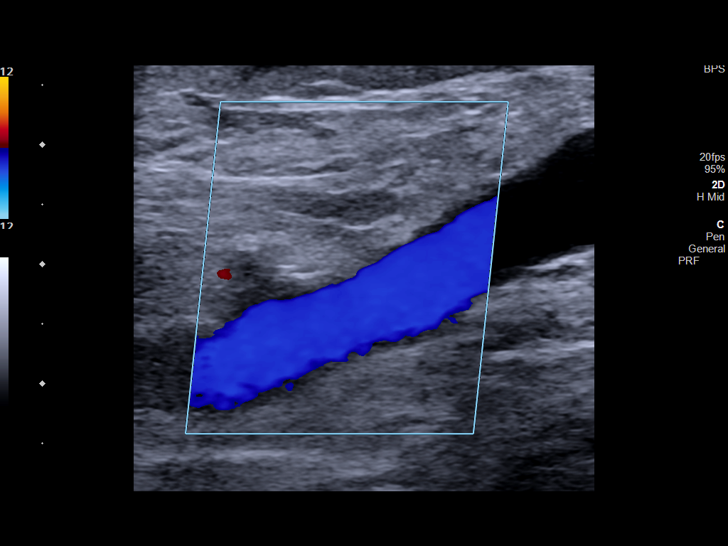
[im 25/64]
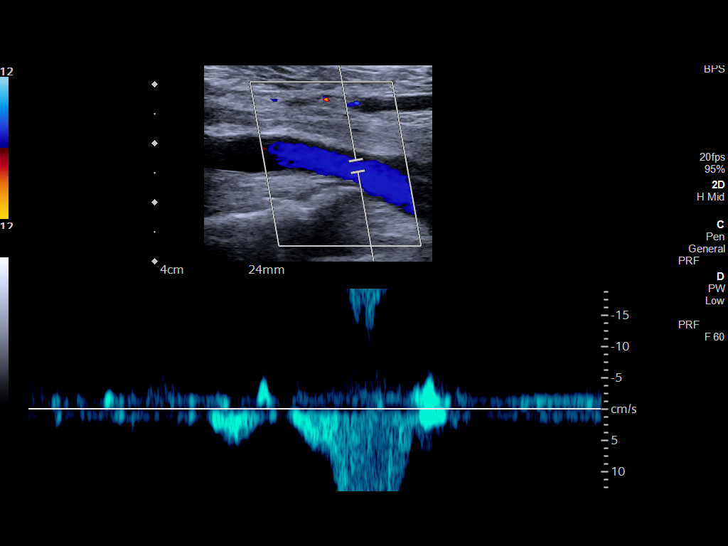
[im 31/64]
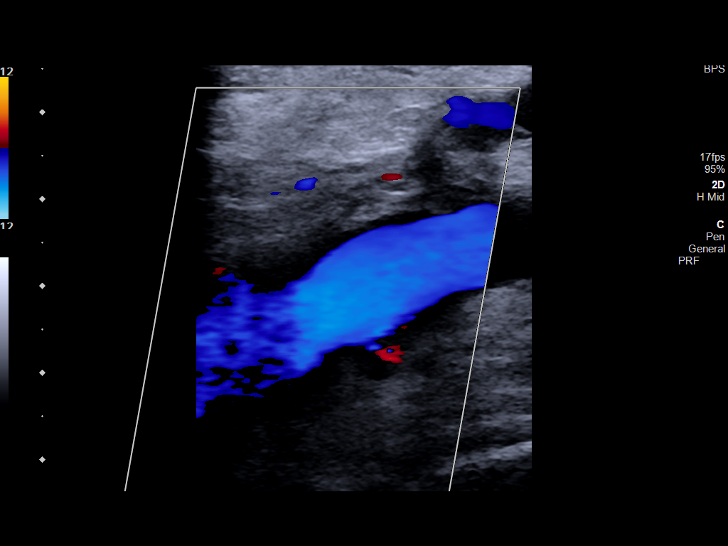
[im 33/64]
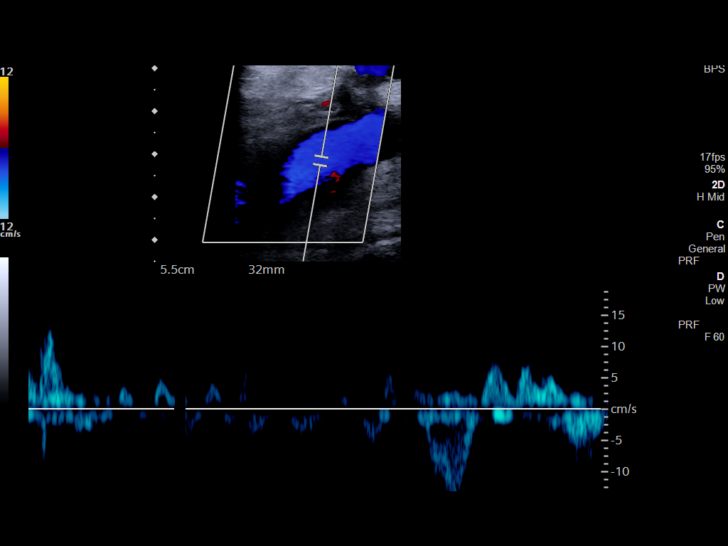
[im 39/64]
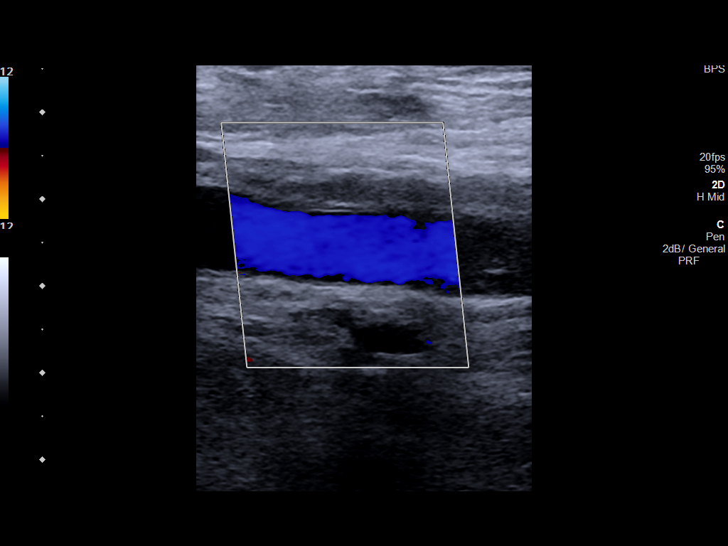
[im 44/64]
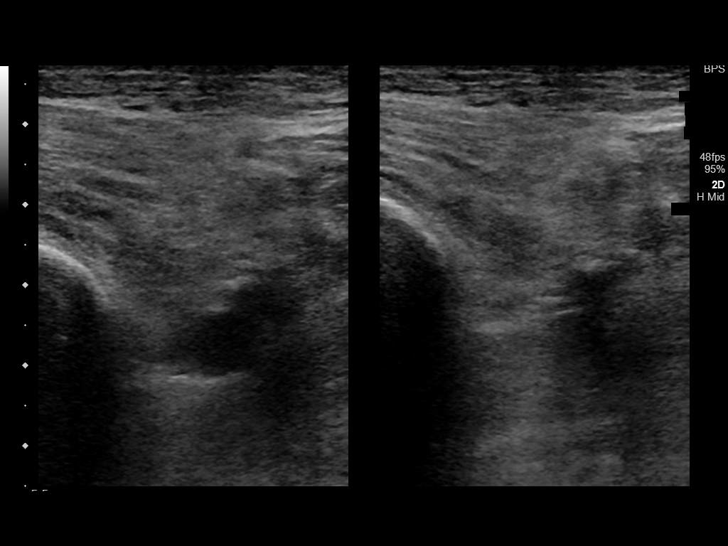
[im 50/64]
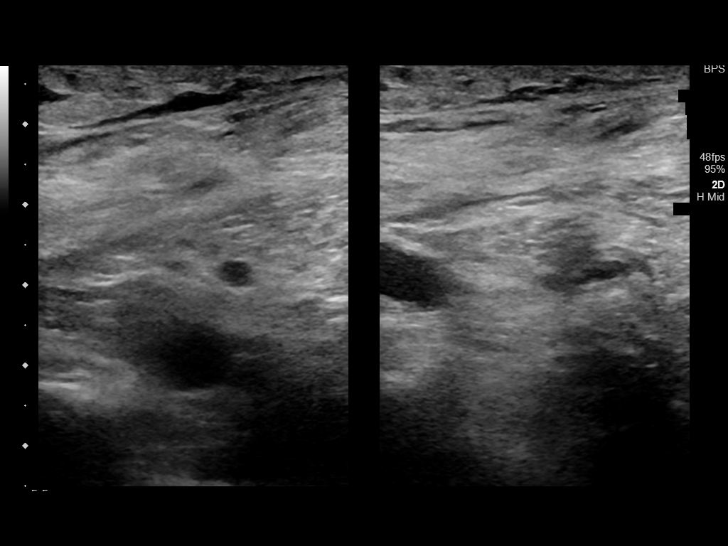
[im 53/64]
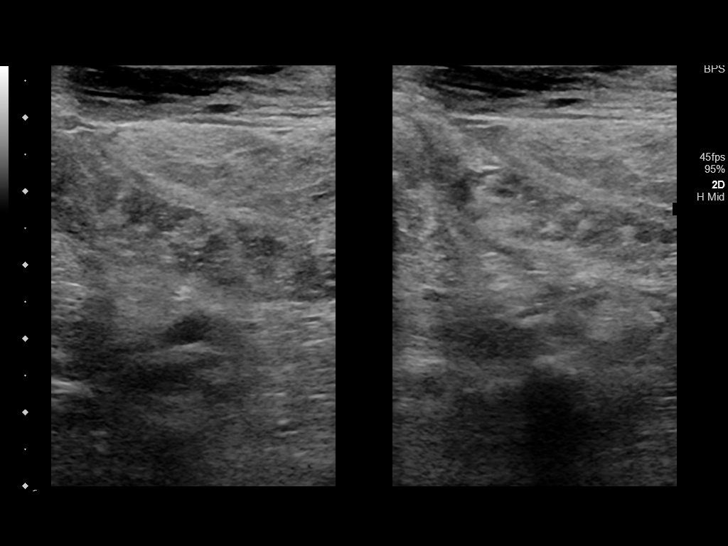
[im 58/64]
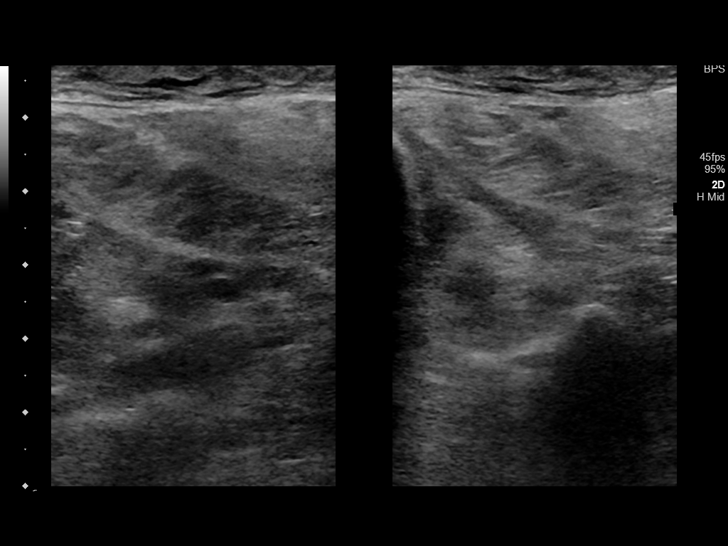
[im 64/64]
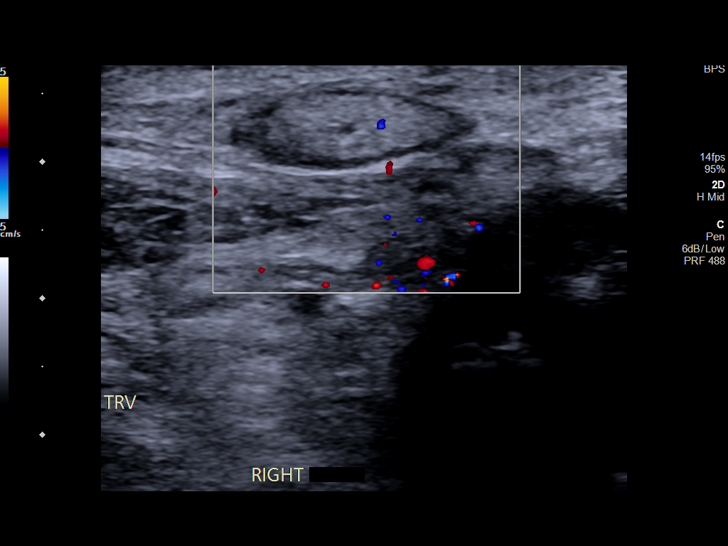

[14 of 24 positions shown; findings below may reference images not displayed]

FINDINGS: Normal compressibility of the common femoral, superficial femoral,
and popliteal veins, as well as the proximal calf veins. No filling
defects to suggest DVT on grayscale or color Doppler imaging.
Doppler waveforms show normal direction of venous flow, normal
respiratory phasicity and response to augmentation. Subcutaneous
edema in the calf, left greater than right. Subcentimeter
morphologically unremarkable inguinal lymph nodes.
IMPRESSION: No femoropopliteal and no calf DVT in the visualized calf veins. If
clinical symptoms are inconsistent or if there are persistent or
worsening symptoms, further imaging (possibly involving the iliac
veins) may be warranted.

## 2021-05-20 IMAGING — US US RENAL
1 series · 14 of 25 positions shown · non-contrast
Comparison: None.

CLINICAL DATA: Acute kidney injury

EXAM:
RENAL / URINARY TRACT ULTRASOUND COMPLETE

[Series 1: us renal · 14 of 56 slices shown]
[im 1/56]
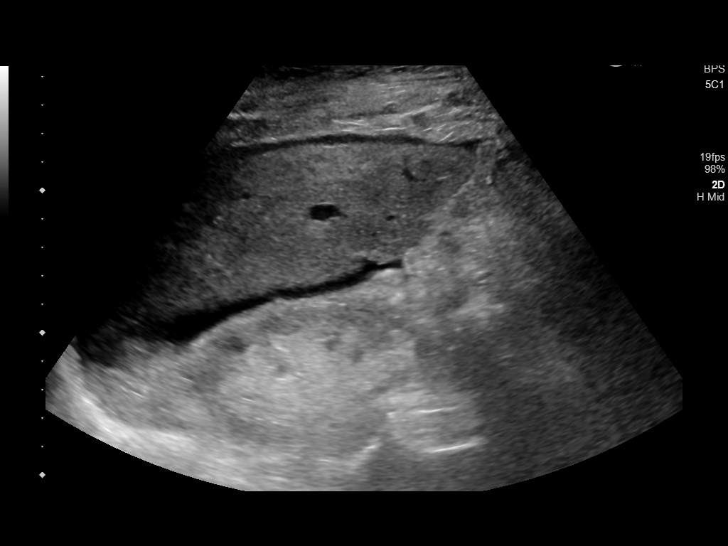
[im 5/56]
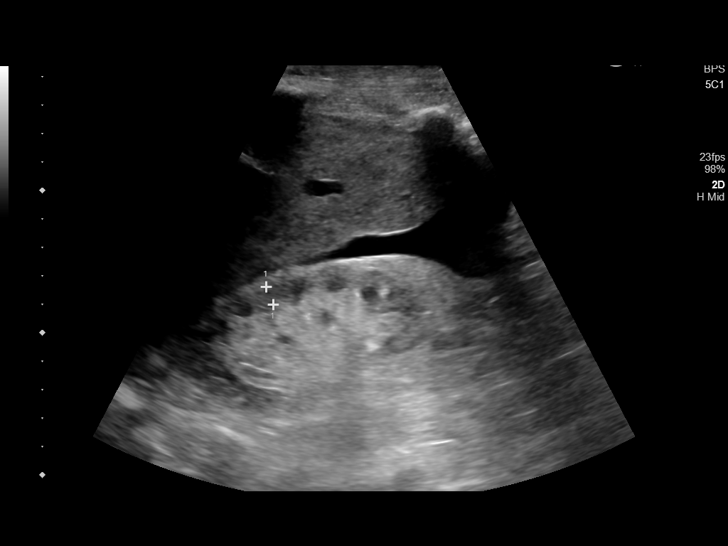
[im 10/56]
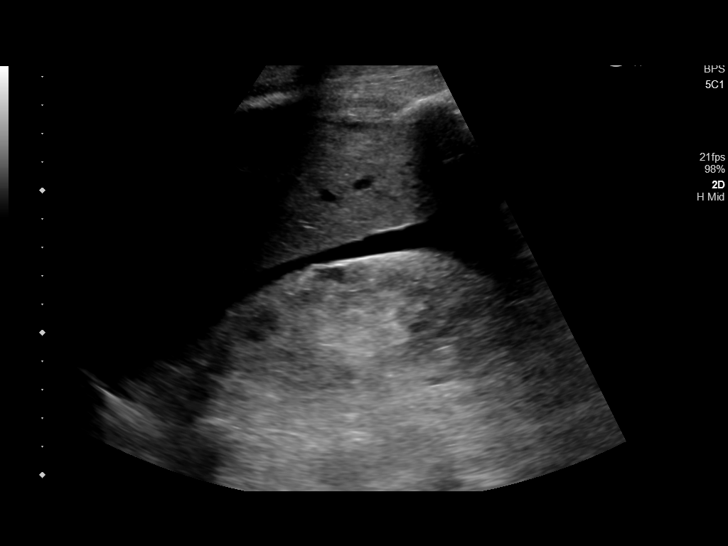
[im 14/56]
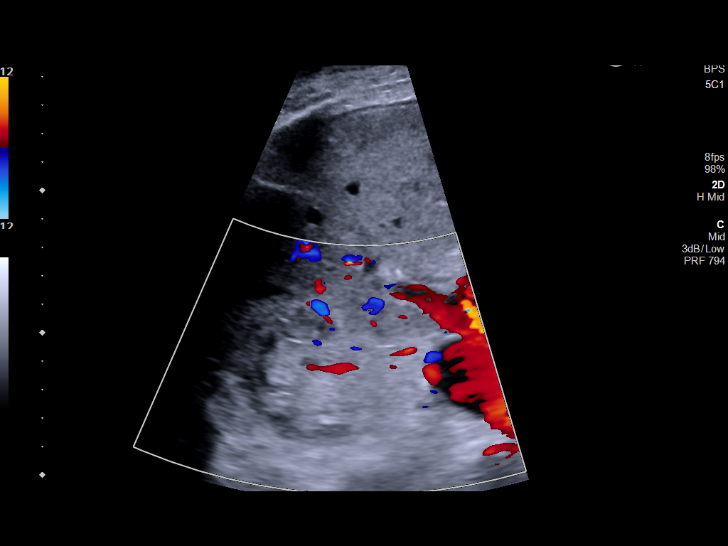
[im 19/56]
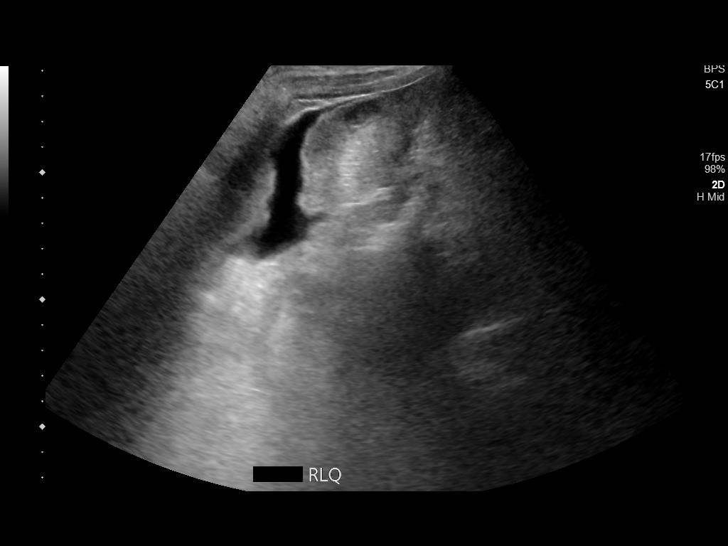
[im 21/56]
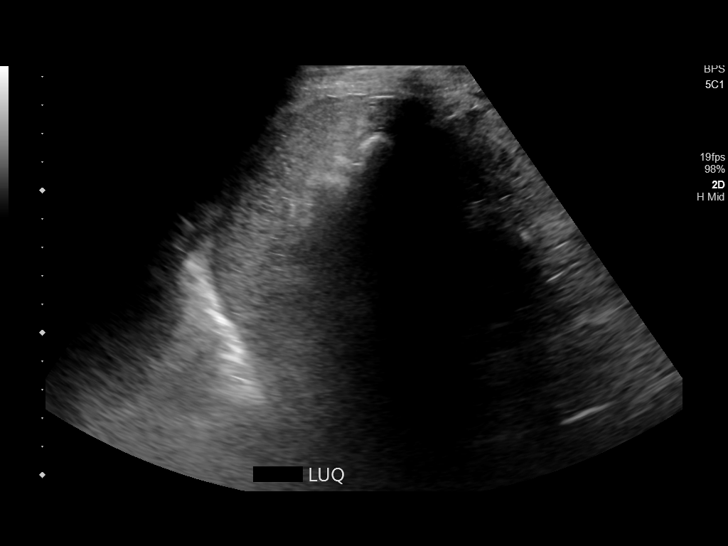
[im 26/56]
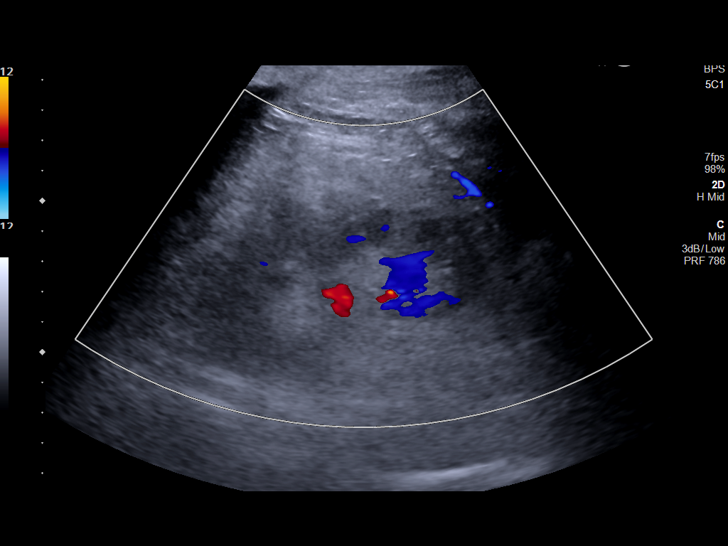
[im 30/56]
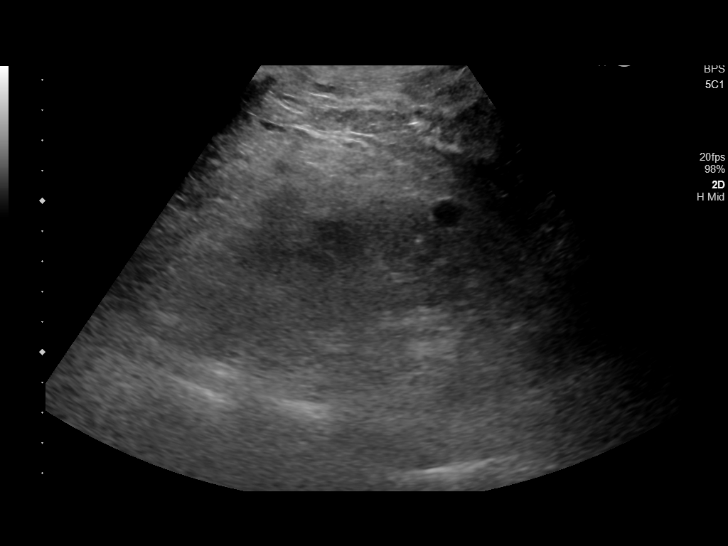
[im 35/56]
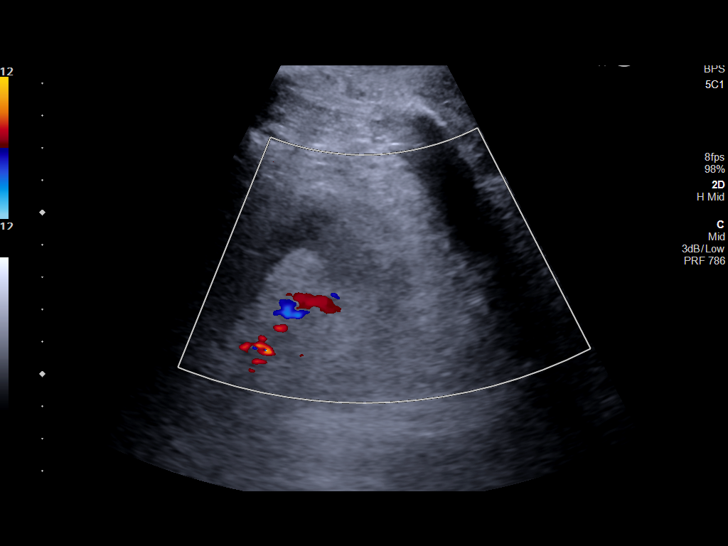
[im 37/56]
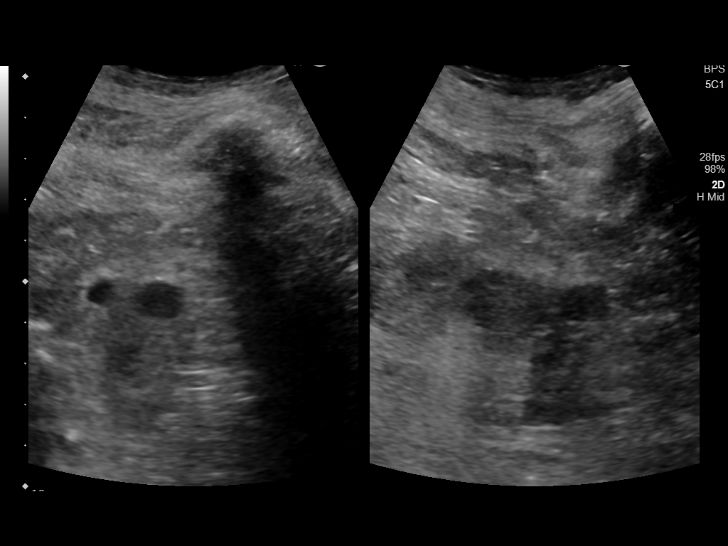
[im 42/56]
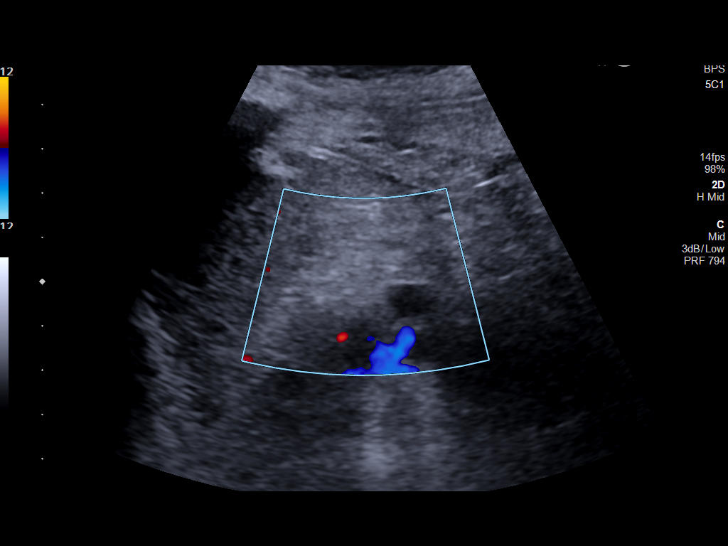
[im 46/56]
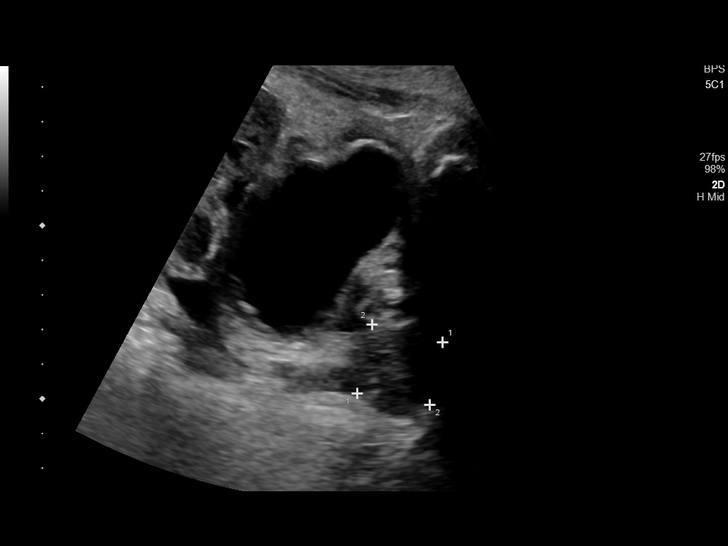
[im 51/56]
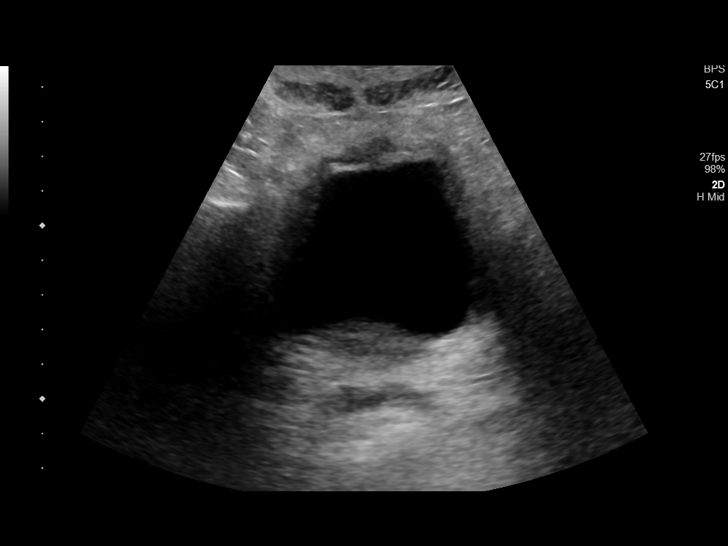
[im 56/56]
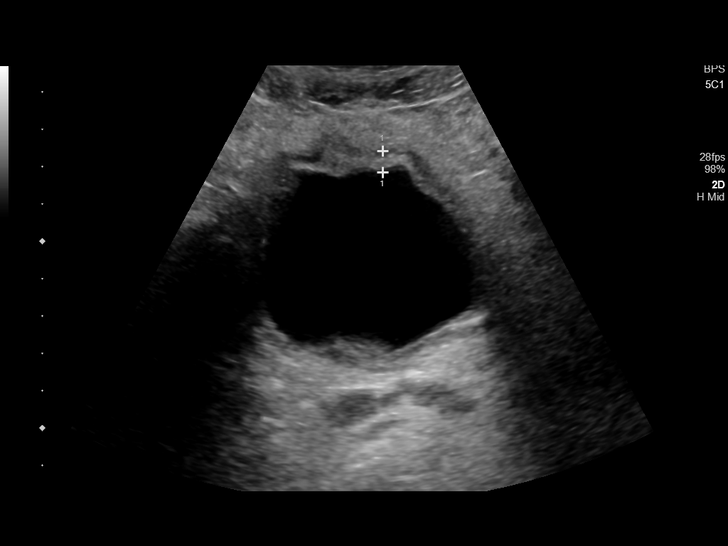

[14 of 25 positions shown; findings below may reference images not displayed]

FINDINGS: Right Kidney:

Renal measurements: 9.2 x 4.1 x 4.6 cm = volume: 92 mL. Renal cortex
is echogenic and thin. 10 mm cyst within the midpole. No suspicious
mass or hydronephrosis.

Left Kidney:

Renal measurements: 10.8 x 5.2 x 4.3 cm = volume: 128 mL. Renal
cortex is echogenic. Cortex thickness is within normal limits. Small
cysts within the midpole and lower pole, each measuring 12 mm. No
suspicious mass or hydronephrosis.

Bladder:

Questionable bladder wall thickening, characterization limited by
incomplete bladder distension.
IMPRESSION: 1. Both kidneys are echogenic suggesting chronic medical renal
disease. No hydronephrosis.
2. Questionable bladder wall thickening, characterization limited by
incomplete bladder distension. Recommend correlation with
urinalysis.

## 2021-05-21 IMAGING — DX PORTABLE CHEST - 1 VIEW
1 series · 1 of 1 positions shown · non-contrast
Comparison: Yesterday

CLINICAL DATA: Acute CHF

EXAM:
PORTABLE CHEST 1 VIEW

[chest ap]
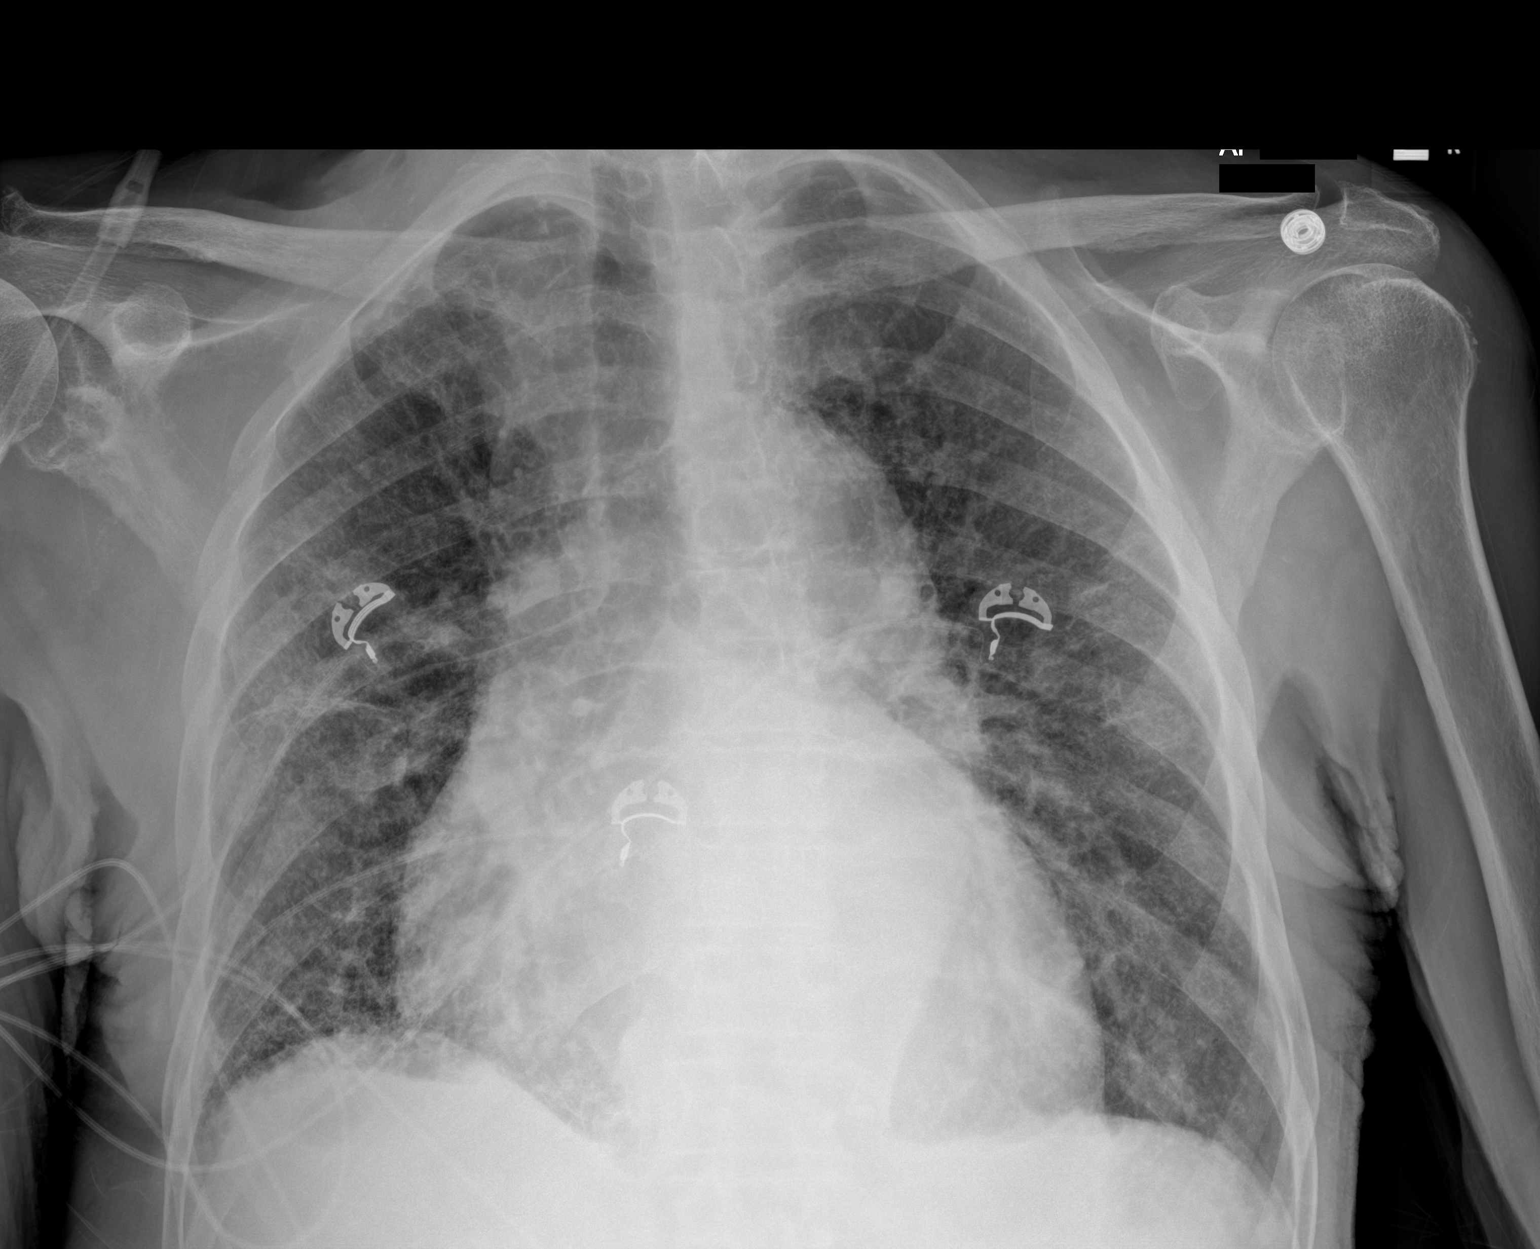

[1 of 1 positions shown; findings below may reference images not displayed]

FINDINGS: Cardiomegaly and aortic tortuosity, stable. Interstitial coarsening
without definite Kerley lines. No pleural effusion. No air
bronchogram or pneumothorax.
IMPRESSION: History of CHF with stable interstitial opacity. Suspect at least
some of this opacity is from chronic lung disease.
